# Patient Record
Sex: Female | Born: 1975 | Race: White | Hispanic: No | Marital: Married | Smoking: Former smoker
Health system: Southern US, Community
[De-identification: ages and names within clinical notes are randomized; demographics above are authoritative.]

## PROBLEM LIST (undated history)

## (undated) DIAGNOSIS — R112 Nausea with vomiting, unspecified: Secondary | ICD-10-CM

## (undated) DIAGNOSIS — E079 Disorder of thyroid, unspecified: Secondary | ICD-10-CM

## (undated) DIAGNOSIS — E039 Hypothyroidism, unspecified: Secondary | ICD-10-CM

## (undated) DIAGNOSIS — R51 Headache: Secondary | ICD-10-CM

## (undated) DIAGNOSIS — G9332 Myalgic encephalomyelitis/chronic fatigue syndrome: Secondary | ICD-10-CM

## (undated) DIAGNOSIS — T753XXA Motion sickness, initial encounter: Secondary | ICD-10-CM

## (undated) DIAGNOSIS — M722 Plantar fascial fibromatosis: Secondary | ICD-10-CM

## (undated) DIAGNOSIS — R87629 Unspecified abnormal cytological findings in specimens from vagina: Secondary | ICD-10-CM

## (undated) DIAGNOSIS — K219 Gastro-esophageal reflux disease without esophagitis: Secondary | ICD-10-CM

## (undated) DIAGNOSIS — R519 Headache, unspecified: Secondary | ICD-10-CM

## (undated) DIAGNOSIS — Z9889 Other specified postprocedural states: Secondary | ICD-10-CM

## (undated) DIAGNOSIS — M1990 Unspecified osteoarthritis, unspecified site: Secondary | ICD-10-CM

## (undated) DIAGNOSIS — R5382 Chronic fatigue, unspecified: Secondary | ICD-10-CM

## (undated) HISTORY — PX: FOOT SURGERY: SHX648

## (undated) HISTORY — PX: BLADDER SURGERY: SHX569

## (undated) HISTORY — DX: Hypothyroidism, unspecified: E03.9

## (undated) HISTORY — PX: CERVICAL BIOPSY  W/ LOOP ELECTRODE EXCISION: SUR135

## (undated) HISTORY — DX: Disorder of thyroid, unspecified: E07.9

## (undated) HISTORY — DX: Myalgic encephalomyelitis/chronic fatigue syndrome: G93.32

## (undated) HISTORY — DX: Gastro-esophageal reflux disease without esophagitis: K21.9

## (undated) HISTORY — DX: Unspecified osteoarthritis, unspecified site: M19.90

## (undated) HISTORY — DX: Unspecified abnormal cytological findings in specimens from vagina: R87.629

## (undated) HISTORY — DX: Chronic fatigue, unspecified: R53.82

---

## 2009-10-30 ENCOUNTER — Ambulatory Visit: Payer: Self-pay | Admitting: Internal Medicine

## 2010-07-24 ENCOUNTER — Ambulatory Visit: Payer: Self-pay | Admitting: Internal Medicine

## 2012-08-18 ENCOUNTER — Ambulatory Visit: Payer: Self-pay | Admitting: Obstetrics and Gynecology

## 2012-08-20 ENCOUNTER — Other Ambulatory Visit: Payer: Self-pay | Admitting: Obstetrics and Gynecology

## 2012-08-20 DIAGNOSIS — R928 Other abnormal and inconclusive findings on diagnostic imaging of breast: Secondary | ICD-10-CM

## 2012-09-01 ENCOUNTER — Ambulatory Visit
Admission: RE | Admit: 2012-09-01 | Discharge: 2012-09-01 | Disposition: A | Discharge status: Home / Self Care | Source: Ambulatory Visit | Attending: Obstetrics and Gynecology | Admitting: Obstetrics and Gynecology

## 2012-09-01 DIAGNOSIS — R928 Other abnormal and inconclusive findings on diagnostic imaging of breast: Secondary | ICD-10-CM

## 2012-10-17 ENCOUNTER — Ambulatory Visit: Payer: Self-pay | Admitting: Family Medicine

## 2012-12-31 ENCOUNTER — Ambulatory Visit: Payer: Self-pay

## 2013-06-02 ENCOUNTER — Ambulatory Visit: Payer: Self-pay | Admitting: Physician Assistant

## 2013-06-02 LAB — RAPID INFLUENZA A&B ANTIGENS

## 2013-09-07 DIAGNOSIS — E039 Hypothyroidism, unspecified: Secondary | ICD-10-CM | POA: Insufficient documentation

## 2013-09-07 DIAGNOSIS — E042 Nontoxic multinodular goiter: Secondary | ICD-10-CM | POA: Insufficient documentation

## 2013-12-16 DIAGNOSIS — D8989 Other specified disorders involving the immune mechanism, not elsewhere classified: Secondary | ICD-10-CM | POA: Insufficient documentation

## 2013-12-16 DIAGNOSIS — G9332 Myalgic encephalomyelitis/chronic fatigue syndrome: Secondary | ICD-10-CM | POA: Insufficient documentation

## 2013-12-16 DIAGNOSIS — R5382 Chronic fatigue, unspecified: Secondary | ICD-10-CM | POA: Insufficient documentation

## 2013-12-16 DIAGNOSIS — M2550 Pain in unspecified joint: Secondary | ICD-10-CM | POA: Insufficient documentation

## 2013-12-16 DIAGNOSIS — R635 Abnormal weight gain: Secondary | ICD-10-CM | POA: Insufficient documentation

## 2014-03-28 ENCOUNTER — Ambulatory Visit: Payer: Self-pay

## 2014-07-06 DIAGNOSIS — M7712 Lateral epicondylitis, left elbow: Secondary | ICD-10-CM | POA: Insufficient documentation

## 2014-07-06 DIAGNOSIS — M7710 Lateral epicondylitis, unspecified elbow: Secondary | ICD-10-CM | POA: Insufficient documentation

## 2014-12-06 ENCOUNTER — Telehealth: Payer: Self-pay | Admitting: Obstetrics and Gynecology

## 2014-12-06 NOTE — Telephone Encounter (Signed)
Pt had mammo 09/01/12 and got a letter that she is due for f/u mammo. She went to [facility] b/c they couldn't get a good mammo here at [facility]. SHE WANTS TO KNOW IF SHE CAN GET A MAMMO YEARLY AT HER AGE OR WHAT AGE SHE NEEDS TO GO BACK. SORRY FOR CAPS!!!

## 2014-12-06 NOTE — Telephone Encounter (Signed)
Can typically only have one covered yearly before 78 if strong family h/o breast cancer at young age, or if we are following specific area in herself.  And would have to been seen first to place order.

## 2014-12-06 NOTE — Telephone Encounter (Signed)
pls advise

## 2014-12-07 NOTE — Telephone Encounter (Signed)
Notified pt she is going to call and schedule annual exam in 2 months will get mammo ordered then

## 2014-12-16 ENCOUNTER — Ambulatory Visit (INDEPENDENT_AMBULATORY_CARE_PROVIDER_SITE_OTHER): Payer: Federal, State, Local not specified - PPO | Admitting: Family Medicine

## 2014-12-16 ENCOUNTER — Ambulatory Visit: Payer: Self-pay | Admitting: Family Medicine

## 2014-12-16 ENCOUNTER — Encounter: Payer: Self-pay | Admitting: Family Medicine

## 2014-12-16 ENCOUNTER — Encounter (INDEPENDENT_AMBULATORY_CARE_PROVIDER_SITE_OTHER): Payer: Self-pay

## 2014-12-16 VITALS — BP 102/66 | HR 63 | Temp 98.9°F | Resp 16 | Ht 66.0 in | Wt 204.9 lb

## 2014-12-16 DIAGNOSIS — E038 Other specified hypothyroidism: Secondary | ICD-10-CM | POA: Diagnosis not present

## 2014-12-16 DIAGNOSIS — E042 Nontoxic multinodular goiter: Secondary | ICD-10-CM | POA: Diagnosis not present

## 2014-12-16 DIAGNOSIS — E034 Atrophy of thyroid (acquired): Secondary | ICD-10-CM

## 2014-12-16 NOTE — Progress Notes (Signed)
Name: Courtney Alvarez   MRN: [id number]    DOB: June 09, 1975   Date:12/16/2014       Progress Note  Subjective  Chief Complaint  Chief Complaint  Patient presents with  . 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 Adella Hare, endocrinologist, for 4 years now is here today to establish care for primary care services.  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 Routine blood work done through [facility]. CPE, PAP and Mammogram done through [facility].  Lateefa reports no acute concerns today.  Patient Active Problem List   Diagnosis Date Noted  . Backhand tennis elbow 07/06/2014  . CFIDS (chronic fatigue and immune dysfunction syndrome) 12/16/2013  . Arthralgia of multiple joints 12/16/2013  . Abnormal weight gain 12/16/2013  . 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 Adult hypothyroidism 09/07/2013    Social History  Substance Use Topics  . Smoking status: Former Research scientist (life sciences)  . Smokeless tobacco: Not on file     Comment: quit 06/2003  . Alcohol Use: 0.0 oz/week    0 Standard drinks or equivalent per week     Comment: very rarely, 1-2 per month if any     Current outpatient prescriptions:  .  Cholecalciferol (PA VITAMIN D-3 GUMMY PO), Take by mouth., Disp: , Rfl:  .  lansoprazole (PREVACID) 15 MG capsule, Take 15 mg by mouth daily at 12 noon., Disp: , Rfl:  .  levothyroxine (SYNTHROID) 150 MCG tablet, Take 1 tablet po q daily except on Sunday's, Disp: , Rfl:  .  Multiple Vitamins-Minerals (MULTIVITAMIN GUMMIES WOMENS) CHEW, Chew by mouth., Disp: , Rfl:  .  naproxen (NAPROSYN) 250 MG tablet, Take by mouth 2 (two) times daily with a meal., Disp: , Rfl:   Past Surgical History   Procedure Laterality Date  . Bladder surgery    . Cervical biopsy  w/ loop electrode excision      Family History  Problem Relation Age of Onset  . Arthritis Mother   . COPD Mother   . Thyroid disease Mother   . Arthritis Father   . Hyperlipidemia Father   . Cancer Father     polyp removed  . Hashimoto's thyroiditis Sister   . Arthritis Maternal Grandmother   . Cancer Maternal Grandmother     colon  . Heart disease Maternal Grandmother   . Thyroid disease Maternal Grandmother   . Alcohol abuse Maternal Grandfather   . Arthritis Maternal Grandfather   . Arthritis Paternal Grandmother   . Depression Paternal Grandmother   . Hypertension Paternal Grandmother   . Stroke Paternal Grandmother   . Arthritis Paternal Grandfather   . Heart disease Paternal Grandfather   . Hypertension Paternal Grandfather     No Known Allergies   Review of Systems  CONSTITUTIONAL: No significant weight changes, fever, chills, weakness or fatigue.  HEENT:  - Eyes: No visual changes.  - Ears: No auditory changes. No pain.  - Nose: No sneezing, congestion, runny nose. - Throat: No sore throat. No changes in swallowing. SKIN: No rash or itching.  CARDIOVASCULAR: No chest pain, chest pressure or chest discomfort. No palpitations  or edema.  RESPIRATORY: No shortness of breath, cough or sputum.  GASTROINTESTINAL: No anorexia, nausea, vomiting. No changes in bowel habits. No abdominal pain or blood.  GENITOURINARY: No dysuria. No frequency. No discharge.  NEUROLOGICAL: No headache, dizziness, syncope, paralysis, ataxia, numbness or tingling in the extremities. No memory changes. No change in bowel or bladder control.  MUSCULOSKELETAL: No joint pain. No muscle pain. HEMATOLOGIC: No anemia, bleeding or bruising.  LYMPHATICS: No enlarged lymph nodes.  PSYCHIATRIC: No change in mood. No change in sleep pattern.  ENDOCRINOLOGIC: No reports of sweating, cold or heat intolerance. No polyuria or  polydipsia.     Objective  BP 102/66 mmHg  Pulse 63  Temp(Src) 98.9 F (37.2 C) (Oral)  Resp 16  Ht 5\' 6"  (1.676 m)  Wt 204 lb 14.4 oz (92.942 kg)  BMI 33.09 kg/m2  SpO2 98% Body mass index is 33.09 kg/(m^2).  Physical Exam  Constitutional: Patient is overweight and well-nourished. In no distress.  HEENT:  - Head: Normocephalic and atraumatic.  - Ears: Bilateral TMs gray, no erythema or effusion - Nose: Nasal mucosa moist - Mouth/Throat: Oropharynx is clear and moist. No tonsillar hypertrophy or erythema. No post nasal drainage.  - Eyes: Conjunctivae clear, EOM movements normal. PERRLA. No scleral icterus.  Neck: Normal range of motion. Neck supple. No JVD present. Mild diffuse thyromegaly present.  Cardiovascular: Normal rate, regular rhythm and normal heart sounds.  No murmur heard.  Pulmonary/Chest: Effort normal and breath sounds normal. No respiratory distress. Musculoskeletal: Normal range of motion bilateral UE and LE, no joint effusions. Peripheral vascular: Bilateral LE no edema. Neurological: CN II-XII grossly intact with no focal deficits. Alert and oriented to person, place, and time. Coordination, balance, strength, speech and gait are normal.  Skin: Skin is warm and dry. No rash noted. No erythema.  Psychiatric: Patient has a normal mood and affect. Behavior is normal in office today. Judgment and thought content normal in office today.   Assessment & Plan  1. Hypothyroidism due to acquired atrophy of thyroid Clinically stable findings based on clinical exam and on review of any pertinent results. Recommended to patient that they continue their current regimen with regular follow ups.  2. Goiter, nontoxic, multinodular Clinically stable findings based on clinical exam.  Return for nursing visit for PPD testing.

## 2014-12-23 ENCOUNTER — Ambulatory Visit: Payer: Self-pay | Admitting: Family Medicine

## 2015-01-03 ENCOUNTER — Ambulatory Visit (INDEPENDENT_AMBULATORY_CARE_PROVIDER_SITE_OTHER): Payer: Federal, State, Local not specified - PPO

## 2015-01-03 DIAGNOSIS — Z23 Encounter for immunization: Secondary | ICD-10-CM

## 2015-01-03 DIAGNOSIS — Z111 Encounter for screening for respiratory tuberculosis: Secondary | ICD-10-CM

## 2015-01-05 ENCOUNTER — Ambulatory Visit (INDEPENDENT_AMBULATORY_CARE_PROVIDER_SITE_OTHER): Payer: Federal, State, Local not specified - PPO

## 2015-01-05 DIAGNOSIS — Z23 Encounter for immunization: Secondary | ICD-10-CM

## 2015-01-10 ENCOUNTER — Ambulatory Visit: Payer: Federal, State, Local not specified - PPO

## 2015-01-12 ENCOUNTER — Telehealth: Payer: Self-pay | Admitting: Family Medicine

## 2015-01-12 ENCOUNTER — Ambulatory Visit: Payer: Federal, State, Local not specified - PPO

## 2015-01-12 ENCOUNTER — Other Ambulatory Visit: Payer: Self-pay | Admitting: Family Medicine

## 2015-01-12 DIAGNOSIS — Z9189 Other specified personal risk factors, not elsewhere classified: Principal | ICD-10-CM

## 2015-01-12 DIAGNOSIS — Z2839 Other underimmunization status: Secondary | ICD-10-CM

## 2015-01-12 NOTE — Telephone Encounter (Signed)
Patient was told she needed to varicella vaccinations or have labs drawn proving her immunity in order for her to be accepted into the program. Patient was told that I didn't think that it was a problem to order labs and that we can have it ready when she comes back for the 2nd PPD test.

## 2015-01-12 NOTE — Telephone Encounter (Signed)
Requesting return call concerning chicken pox vaccination.

## 2015-01-12 NOTE — Telephone Encounter (Signed)
Ordered varicella antibody lab work.

## 2015-01-17 ENCOUNTER — Ambulatory Visit (INDEPENDENT_AMBULATORY_CARE_PROVIDER_SITE_OTHER): Payer: Federal, State, Local not specified - PPO

## 2015-01-17 DIAGNOSIS — Z111 Encounter for screening for respiratory tuberculosis: Secondary | ICD-10-CM | POA: Diagnosis not present

## 2015-01-18 ENCOUNTER — Encounter: Payer: Self-pay | Admitting: Family Medicine

## 2015-01-18 ENCOUNTER — Telehealth: Payer: Self-pay | Admitting: Family Medicine

## 2015-01-18 DIAGNOSIS — Z789 Other specified health status: Secondary | ICD-10-CM

## 2015-01-18 LAB — VARICELLA ZOSTER ANTIBODY, IGG: Varicella zoster IgG: >4000 index (ref >165)

## 2015-01-18 NOTE — Telephone Encounter (Signed)
Please let Courtney Alvarez know that her Varicella antibody titers came back positive for proper immunization. Does she need [place] to fax this result or print it out for her school? I have released the results to her through My Chart as well.

## 2015-01-18 NOTE — Telephone Encounter (Signed)
Patient was informed and information was printed for her to pick up tomorrow when she has her TB read.

## 2015-01-19 ENCOUNTER — Encounter: Payer: Self-pay | Admitting: Family Medicine

## 2015-01-19 LAB — TB SKIN TEST
Induration: 0 mm
TB Skin Test: NEGATIVE

## 2015-02-24 ENCOUNTER — Encounter: Payer: Self-pay | Admitting: Family Medicine

## 2015-02-24 ENCOUNTER — Ambulatory Visit (INDEPENDENT_AMBULATORY_CARE_PROVIDER_SITE_OTHER): Payer: Federal, State, Local not specified - PPO | Admitting: Family Medicine

## 2015-02-24 VITALS — BP 108/68 | HR 78 | Temp 98.7°F | Resp 18 | Ht 66.0 in | Wt 200.4 lb

## 2015-02-24 DIAGNOSIS — J040 Acute laryngitis: Secondary | ICD-10-CM

## 2015-02-24 DIAGNOSIS — J0100 Acute maxillary sinusitis, unspecified: Secondary | ICD-10-CM | POA: Diagnosis not present

## 2015-02-24 MED ORDER — FLUTICASONE PROPIONATE 50 MCG/ACT NA SUSP: 2.0000 | Freq: Every day | NASAL | Status: DC

## 2015-02-24 MED ORDER — AZITHROMYCIN 250 MG PO TABS: ORAL_TABLET | ORAL | Status: DC

## 2015-02-24 NOTE — Progress Notes (Signed)
Name: Courtney Alvarez   MRN: [id number]    DOB: 11-Sep-1975   Date:02/24/2015       Progress Note  Subjective  Chief Complaint  Chief Complaint  Patient presents with  . Cough  . Hoarse    symptoms for over a week and not getting better  . Nasal Congestion    Cough This is a recurrent problem. Episode onset: over 7 days ago. The problem has been gradually improving. Associated symptoms include nasal congestion and a sore throat. Pertinent negatives include no chest pain, chills, ear pain, fever, headaches or shortness of breath. She has tried OTC cough suppressant for the symptoms.  Patient has also "lost her voice" in the past few days.  Past Medical History  Diagnosis Date  . Arthritis   . Thyroid disease   . GERD (gastroesophageal reflux disease)   . Chronic fatigue syndrome     Past Surgical History  Procedure Laterality Date  . Bladder surgery    . Cervical biopsy  w/ loop electrode excision      Family History  Problem Relation Age of Onset  . Arthritis Mother   . COPD Mother   . Thyroid disease Mother   . Arthritis Father   . Hyperlipidemia Father   . Cancer Father     polyp removed  . Hashimoto's thyroiditis Sister   . Arthritis Maternal Grandmother   . Cancer Maternal Grandmother     colon  . Heart disease Maternal Grandmother   . Thyroid disease Maternal Grandmother   . Alcohol abuse Maternal Grandfather   . Arthritis Maternal Grandfather   . Arthritis Paternal Grandmother   . Depression Paternal Grandmother   . Hypertension Paternal Grandmother   . Stroke Paternal Grandmother   . Arthritis Paternal Grandfather   . Heart disease Paternal Grandfather   . Hypertension Paternal Grandfather     Social History   Social History  . Marital Status: Married    Spouse Name: N/A  . Number of Children: N/A  . Years of Education: N/A   Occupational History  . Not on file.   Social History Main Topics  . Smoking status: Former Research scientist (life sciences)  . Smokeless  tobacco: Not on file     Comment: quit 06/2003  . Alcohol Use: 0.0 oz/week    0 Standard drinks or equivalent per week     Comment: very rarely, 1-2 per month if any  . Drug Use: No  . Sexual Activity:    Partners: Male    Birth Control/ Protection: IUD   Other Topics Concern  . Not on file   Social History Narrative     Current outpatient prescriptions:  .  Cholecalciferol (PA VITAMIN D-3 GUMMY PO), Take by mouth., Disp: , Rfl:  .  lansoprazole (PREVACID) 15 MG capsule, Take 15 mg by mouth daily at 12 noon., Disp: , Rfl:  .  Multiple Vitamins-Minerals (MULTIVITAMIN GUMMIES WOMENS) CHEW, Chew by mouth., Disp: , Rfl:  .  naproxen (NAPROSYN) 250 MG tablet, Take by mouth 2 (two) times daily with a meal., Disp: , Rfl:  .  NATURE-THROID 97.5 MG TABS, TK 1 T PO QD, Disp: , Rfl: 5  No Known Allergies   Review of Systems  Constitutional: Negative for fever and chills.  HENT: Positive for congestion and sore throat. Negative for ear discharge and ear pain.   Respiratory: Positive for cough. Negative for shortness of breath.   Cardiovascular: Negative for chest pain.  Neurological: Negative for headaches.  Objective  Filed Vitals:   02/24/15 0926  BP: 108/68  Pulse: 78  Temp: 98.7 F (37.1 C)  Resp: 18  Height: 5\' 6"  (1.676 m)  Weight: 200 lb 7 oz (90.918 kg)  SpO2: 98%    Physical Exam  Constitutional: She is well-developed, well-nourished, and in no distress.  HENT:  Head: Normocephalic.  Right Ear: Ear canal normal.  Left Ear: Ear canal normal.  Nose: Rhinorrhea present. Right sinus exhibits maxillary sinus tenderness. Left sinus exhibits maxillary sinus tenderness.  Mouth/Throat: Posterior oropharyngeal erythema present.  TM not visualized 2 cerumen impaction. Nasal turbinates enlarged, inflamed  Neck: Neck supple.  Cardiovascular: Normal rate, regular rhythm and normal heart sounds.   Pulmonary/Chest: Effort normal and breath sounds normal. She has no wheezes.   Nursing note and vitals reviewed.   Assessment & Plan  1. Acute maxillary sinusitis, recurrence not specified Symptoms consistent with acute maxillary sinusitis, present for over 7 days. We'll start on antibiotic and nasal corticosteroid therapy for relief. - azithromycin (ZITHROMAX) 250 MG tablet; 2 tabs po x day 1, then 1 tab po q day x 4 days  Dispense: 6 tablet; Refill: 0 - fluticasone (FLONASE) 50 MCG/ACT nasal spray; Place 2 sprays into both nostrils daily.  Dispense: 16 g; Refill: 2  2. Laryngitis, acute Recommended voice rest, warm liquids. Should resolve with the resolution of sinusitis.   Kaniyah Lisby [facility] 02/24/2015 9:49 AM

## 2015-04-03 ENCOUNTER — Ambulatory Visit (INDEPENDENT_AMBULATORY_CARE_PROVIDER_SITE_OTHER): Payer: Federal, State, Local not specified - PPO | Admitting: Family Medicine

## 2015-04-03 ENCOUNTER — Encounter: Payer: Self-pay | Admitting: Family Medicine

## 2015-04-03 VITALS — BP 110/70 | HR 68 | Temp 98.2°F | Resp 20 | Ht 66.0 in | Wt 204.6 lb

## 2015-04-03 DIAGNOSIS — H9203 Otalgia, bilateral: Secondary | ICD-10-CM

## 2015-04-03 DIAGNOSIS — J0100 Acute maxillary sinusitis, unspecified: Secondary | ICD-10-CM | POA: Diagnosis not present

## 2015-04-03 DIAGNOSIS — H6123 Impacted cerumen, bilateral: Secondary | ICD-10-CM

## 2015-04-03 MED ORDER — PREDNISONE 20 MG PO TABS: 20.0000 mg | ORAL_TABLET | Freq: Every day | ORAL | Status: DC

## 2015-04-03 NOTE — Progress Notes (Addendum)
Name: Courtney Alvarez   MRN: [id number]    DOB: 1975/06/02   Date:04/03/2015       Progress Note  Subjective  Chief Complaint  Chief Complaint  Patient presents with  . Ear Pain    HPI  Otitis media  Sinusitis  Patient presents with greater than 7 day history of nasal congestion and drainage which is purulent in color. There is tenderness over the sinuses. There has been fever to - along with some associated chills on occasion. Usage of over-the-counter medications is not been affected. There is also accompanying cough productive of purulent sputum.  She is seen at the urgent care and treated with a course of amoxicillin and Flonase  Cerumen impaction  Complaint of bilateral ear fullness and pain with decreased hearing.  Past Medical History  Diagnosis Date  . Arthritis   . Thyroid disease   . GERD (gastroesophageal reflux disease)   . Chronic fatigue syndrome     Social History  Substance Use Topics  . Smoking status: Former Research scientist (life sciences)  . Smokeless tobacco: Not on file     Comment: quit 06/2003  . Alcohol Use: 0.0 oz/week    0 Standard drinks or equivalent per week     Comment: very rarely, 1-2 per month if any     Current outpatient prescriptions:  .  amoxicillin (AMOXIL) 875 MG tablet, TK 1 T PO Q 12 H, Disp: , Rfl: 0 .  azithromycin (ZITHROMAX) 250 MG tablet, 2 tabs po x day 1, then 1 tab po q day x 4 days, Disp: 6 tablet, Rfl: 0 .  benzonatate (TESSALON) 100 MG capsule, TK 1 C PO TID PRF COUGH, Disp: , Rfl: 0 .  Cholecalciferol (PA VITAMIN D-3 GUMMY PO), Take by mouth., Disp: , Rfl:  .  fluticasone (FLONASE) 50 MCG/ACT nasal spray, Place 2 sprays into both nostrils daily., Disp: 16 g, Rfl: 2 .  lansoprazole (PREVACID) 15 MG capsule, Take 15 mg by mouth daily at 12 noon., Disp: , Rfl:  .  Multiple Vitamins-Minerals (MULTIVITAMIN GUMMIES WOMENS) CHEW, Chew by mouth., Disp: , Rfl:  .  naproxen (NAPROSYN) 250 MG tablet, Take by mouth 2 (two) times daily with a meal., Disp:  , Rfl:  .  NATURE-THROID 97.5 MG TABS, TK 1 T PO QD, Disp: , Rfl: 5  No Known Allergies  Review of Systems  Constitutional: Negative for fever, chills and weight loss.  HENT: Positive for congestion and ear pain ( fullness of both eardrums). Negative for hearing loss, sore throat and tinnitus.   Eyes: Negative for blurred vision, double vision and redness.  Respiratory: Positive for cough. Negative for hemoptysis and shortness of breath.   Cardiovascular: Negative for chest pain, palpitations, orthopnea, claudication and leg swelling.  Gastrointestinal: Negative for heartburn, nausea, vomiting, diarrhea, constipation and blood in stool.  Genitourinary: Negative for dysuria, urgency, frequency and hematuria.  Musculoskeletal: Negative for myalgias, back pain, joint pain, falls and neck pain.  Skin: Negative for itching.  Neurological: Negative for dizziness, tingling, tremors, focal weakness, seizures, loss of consciousness, weakness and headaches.  Endo/Heme/Allergies: Does not bruise/bleed easily.  Psychiatric/Behavioral: Negative for depression and substance abuse. The patient is not nervous/anxious and does not have insomnia.      Objective  Filed Vitals:   04/03/15 1123  BP: 110/70  Pulse: 68  Temp: 98.2 F (36.8 C)  Resp: 20  Height: 5\' 6"  (1.676 m)  Weight: 204 lb 9 oz (92.789 kg)  SpO2: 98%  Physical Exam  Constitutional: She is oriented to person, place, and time and well-developed, well-nourished, and in no distress.  HENT:  Head: Normocephalic.  Bilateral cerumen impaction is noted nasal turbinates are erythematous with slightly cloudy discharge  Eyes: EOM are normal. Pupils are equal, round, and reactive to light.  Neck: Normal range of motion. No thyromegaly present.  Cardiovascular: Normal rate, regular rhythm and normal heart sounds.   No murmur heard. Pulmonary/Chest: Effort normal and breath sounds normal.  Musculoskeletal: Normal range of motion. She  exhibits no edema.  Neurological: She is alert and oriented to person, place, and time. No cranial nerve deficit. Gait normal.  Skin: Skin is warm and dry. No rash noted.  Psychiatric: Memory and affect normal.      Assessment & Plan  1. Ear pain, bilateral Secondary to cerumen impaction - Ear Lavage  2. Cerumen impaction, bilateral Ear lavage  3. Acute maxillary sinusitis, recurrence not specified As below - amoxicillin (AMOXIL) 875 MG tablet; TK 1 T PO Q 12 H; Refill: 0 - benzonatate (TESSALON) 100 MG capsule; TK 1 C PO TID PRF COUGH; Refill: 0 - predniSONE (DELTASONE) 20 MG tablet; Take 1 tablet (20 mg total) by mouth daily with breakfast.  Dispense: 10 tablet; Refill: 0 . As needed for significant

## 2015-06-22 DIAGNOSIS — R200 Anesthesia of skin: Secondary | ICD-10-CM | POA: Insufficient documentation

## 2015-06-22 DIAGNOSIS — G8929 Other chronic pain: Secondary | ICD-10-CM | POA: Insufficient documentation

## 2015-06-22 DIAGNOSIS — M79673 Pain in unspecified foot: Secondary | ICD-10-CM | POA: Insufficient documentation

## 2015-06-22 DIAGNOSIS — M79672 Pain in left foot: Secondary | ICD-10-CM

## 2015-07-25 DIAGNOSIS — E039 Hypothyroidism, unspecified: Secondary | ICD-10-CM | POA: Diagnosis not present

## 2015-07-31 ENCOUNTER — Ambulatory Visit (INDEPENDENT_AMBULATORY_CARE_PROVIDER_SITE_OTHER): Payer: Federal, State, Local not specified - PPO | Admitting: Family Medicine

## 2015-07-31 ENCOUNTER — Encounter: Payer: Self-pay | Admitting: Family Medicine

## 2015-07-31 ENCOUNTER — Other Ambulatory Visit: Payer: Self-pay

## 2015-07-31 ENCOUNTER — Encounter: Payer: Federal, State, Local not specified - PPO | Admitting: Family Medicine

## 2015-07-31 ENCOUNTER — Telehealth: Payer: Self-pay

## 2015-07-31 VITALS — BP 98/58 | HR 76 | Temp 98.5°F | Resp 14 | Ht 66.0 in | Wt 211.5 lb

## 2015-07-31 DIAGNOSIS — Z800 Family history of malignant neoplasm of digestive organs: Secondary | ICD-10-CM | POA: Insufficient documentation

## 2015-07-31 DIAGNOSIS — E669 Obesity, unspecified: Secondary | ICD-10-CM

## 2015-07-31 DIAGNOSIS — R635 Abnormal weight gain: Secondary | ICD-10-CM | POA: Diagnosis not present

## 2015-07-31 DIAGNOSIS — M2550 Pain in unspecified joint: Secondary | ICD-10-CM

## 2015-07-31 DIAGNOSIS — Z0000 Encounter for general adult medical examination without abnormal findings: Secondary | ICD-10-CM | POA: Diagnosis not present

## 2015-07-31 DIAGNOSIS — Z789 Other specified health status: Secondary | ICD-10-CM

## 2015-07-31 DIAGNOSIS — B1910 Unspecified viral hepatitis B without hepatic coma: Secondary | ICD-10-CM

## 2015-07-31 DIAGNOSIS — E038 Other specified hypothyroidism: Secondary | ICD-10-CM

## 2015-07-31 DIAGNOSIS — E6609 Other obesity due to excess calories: Secondary | ICD-10-CM | POA: Insufficient documentation

## 2015-07-31 DIAGNOSIS — E034 Atrophy of thyroid (acquired): Secondary | ICD-10-CM

## 2015-07-31 NOTE — Assessment & Plan Note (Signed)
Encouraged weight loss, see AVS for recommendations given

## 2015-07-31 NOTE — Assessment & Plan Note (Signed)
Managed by endo; last TSH elevated

## 2015-07-31 NOTE — Progress Notes (Signed)
Patient ID: Courtney Alvarez, female   DOB: 1976/02/06, 40 y.o.   MRN: [id number]   Subjective:   Courtney Alvarez is a 40 y.o. 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  USPSTF grade A and B recommendations Alcohol: less than target Depression:  Depression screen [facility] 2/9 07/31/2015 12/16/2014  Decreased Interest 0 0  Down, Depressed, Hopeless 0 0  PHQ - 2 Score 0 0   Hypertension: no Obesity: weight gain; has thyroid trouble, sees endocrinologist; has been a roller coaster until last 5-6 years; trying different thyroid med with her endo; feeling better from fatigue, but weight has sky rocketed; does have dry skin Tobacco use: former; quit in 2005 HIV, hep B, hep C: tested, in [employer] STD testing and prevention (chl/gon/syphilis): been tested, all good Lipids: check fasting another day Glucose: fasting another day Colorectal cancer: grandmother passed away from colorectal cancer at age 39 (maternal); father had cancerous tissue removed from colon; both sides Breast cancer: first mammo 2 years ago; due again; [facility]; dense breasts; distant relative on mother's side BRCA gene screening: no ovarian ca Intimate partner violence: no Cervical cancer screening: [facility] Lung cancer: n/a Osteoporosis: steroids as a child, sinus problems; green veggies, takes vit D and calcium; fam hx of osteoporosis, paternal grandmother; no periods with Mirena IUD Fall prevention/vitamin D: takes vit D AAA: n/a Aspirin: n/a Diet: tries to eat well, watches what she eats; does splurge, tries to limit fast food Exercise: weight-lifter, cross fit Skin cancer: no worrisome moles now; one thing removed a few years back; used to use tanning beds stopped those in 2006; melanoma in FATHER  Past Medical History  Diagnosis Date  . Arthritis   . Thyroid disease   . GERD (gastroesophageal  reflux disease)   . Chronic fatigue syndrome    Past Surgical History  Procedure Laterality Date  . Bladder surgery    . Cervical biopsy  w/ loop electrode excision     Family History  Problem Relation Age of Onset  . Arthritis Mother   . COPD Mother   . Thyroid disease Mother   . Arthritis Father   . Hyperlipidemia Father   . Cancer Father     polyp removed  . Hashimoto's thyroiditis Sister   . Arthritis Maternal Grandmother   . Cancer Maternal Grandmother     colon  . Heart disease Maternal Grandmother   . Thyroid disease Maternal Grandmother   . Alcohol abuse Maternal Grandfather   . Arthritis Maternal Grandfather   . Arthritis Paternal Grandmother   . Depression Paternal Grandmother   . Hypertension Paternal Grandmother   . Stroke Paternal Grandmother   . Arthritis Paternal Grandfather   . Heart disease Paternal Grandfather   . Hypertension Paternal Grandfather    Social History  Substance Use Topics  . Smoking status: Former Games developer  . Smokeless tobacco: Not on file     Comment: quit 06/2003  . Alcohol Use: 0.0 oz/week    0 Standard drinks or equivalent per week     Comment: very rarely, 1-2 per month if any   Review of Systems  Respiratory: Negative for shortness of breath.   Cardiovascular: Negative for chest pain.  Musculoskeletal: Positive for arthralgias (hips, knees, left ankle; no swelling of MCPs).   Objective:   Filed Vitals:   07/31/15 0824  BP: 98/58  Pulse: 76  Temp: 98.5 F (36.9 C)  TempSrc: Oral  Resp: 14  Height: '5\' 6"'$  (1.676 m)  Weight: 211 lb 8 oz (95.936 kg)  SpO2: 98%   Body mass index is 34.15 kg/(m^2). Wt Readings from Last 3 Encounters:  07/31/15 211 lb 8 oz (95.936 kg)  04/03/15 204 lb 9 oz (92.789 kg)  02/24/15 200 lb 7 oz (90.918 kg)   Physical Exam  Constitutional: She appears well-developed and well-nourished. No distress.  Obese; weight gain 11 pounds over last 5 months  HENT:  Head: Normocephalic and atraumatic.   Eyes: EOM are normal. No scleral icterus.  Neck: No thyromegaly present.  Cardiovascular: Normal rate, regular rhythm and normal heart sounds.   No murmur heard. Pulmonary/Chest: Effort normal and breath sounds normal. No respiratory distress. She has no wheezes.  Breast exam done by [facility]  Abdominal: Soft. Bowel sounds are normal. She exhibits no distension.  Genitourinary:  Gyn exam done by [facility]  Musculoskeletal: Normal range of motion. She exhibits no edema.  Neurological: She is alert. She exhibits normal muscle tone.  Skin: Skin is warm and dry. She is not diaphoretic. No pallor.  Psychiatric: She has a normal mood and affect. Her behavior is normal. Judgment and thought content normal. Her mood appears not anxious. She does not exhibit a depressed mood.   Assessment/Plan:   Problem List Items Addressed This Visit      Endocrine   Adult hypothyroidism    Managed by endo; last TSH elevated        Other   Arthralgia of multiple joints    Nothing on exam to suggest inflammatory condition      Relevant Orders   VITAMIN D 25 Hydroxy (Vit-D Deficiency, Fractures)   Abnormal weight gain    Thyroid dysfunction managed by endo; recommendations for weight loss in AVS      Family history of colon cancer    Patient reports "cancerous cells" from something removed from father's colon, and maternal grandmother died from colon cancer; she would like to talk to a gastroenterologist about whether or not screening is needed before age 85      Relevant Orders   Ambulatory referral to Gastroenterology   Annual physical exam - Primary    USPSTF grade A and B recommendations reviewed with patient; age-appropriate recommendations, preventive care, screening tests, etc discussed and encouraged; healthy living encouraged; see AVS for patient education given to patient; gyn components and breast exam and mammo through GYN office      Relevant Orders   Tympanometry    Visual acuity screening   Comprehensive metabolic panel   Lipid Panel w/o Chol/HDL Ratio   CBC with Differential/Platelet   Obesity    Encouraged weight loss, see AVS for recommendations given       Other Visit Diagnoses    Hepatitis B virus serologic status unknown        check hepatitis B vaccine status prior to starting nursing school    Relevant Orders    Hepatitis B Surface AntiBODY    Hepatitis B Surface AntiGEN       Meds ordered this encounter  Medications  . diclofenac (VOLTAREN) 75 MG EC tablet    Sig: Take 1 tablet by mouth daily.    Refill:  2  . gabapentin (NEURONTIN) 100 MG capsule    Sig: Take 2 capsules by mouth daily.    Refill:  3   Orders Placed This Encounter  Procedures  . Hepatitis  B Surface AntiBODY    Standing Status: Future     Number of Occurrences:      Standing Expiration Date: 09/04/2015  . Hepatitis B Surface AntiGEN    Standing Status: Future     Number of Occurrences:      Standing Expiration Date: 09/04/2015  . Comprehensive metabolic panel    Standing Status: Future     Number of Occurrences:      Standing Expiration Date: 09/04/2015    Order Specific Question:  Has the patient fasted?    Answer:  Yes  . Lipid Panel w/o Chol/HDL Ratio    Standing Status: Future     Number of Occurrences:      Standing Expiration Date: 09/04/2015    Order Specific Question:  Has the patient fasted?    Answer:  Yes  . VITAMIN D 25 Hydroxy (Vit-D Deficiency, Fractures)    Standing Status: Future     Number of Occurrences:      Standing Expiration Date: 09/04/2015  . CBC with Differential/Platelet    Standing Status: Future     Number of Occurrences:      Standing Expiration Date: 09/04/2015  . Ambulatory referral to Gastroenterology    Referral Priority:  Routine    Referral Type:  Consultation    Referral Reason:  Specialty Services Required    Number of Visits Requested:  1  . Visual acuity screening  . Tympanometry    Order Specific Question:   Where should this test be performed?    Answer:  Other   Follow up plan: Return in about 1 year (around 07/30/2016) for complete physical.  An after-visit summary was printed and given to the patient at [facility].  Please see the patient instructions which may contain other information and recommendations beyond what is mentioned above in the assessment and plan.  See form for nursing school

## 2015-07-31 NOTE — Assessment & Plan Note (Signed)
Nothing on exam to suggest inflammatory condition

## 2015-07-31 NOTE — Assessment & Plan Note (Signed)
Thyroid dysfunction managed by endo; recommendations for weight loss in AVS

## 2015-07-31 NOTE — Patient Instructions (Addendum)
Check out the information at familydoctor.org entitled "What It Takes to Lose Weight" Try to lose between 1-2 pounds per week by taking in fewer calories and burning off more calories You can succeed by limiting portions, limiting foods dense in calories and fat, becoming more active, and drinking 8 glasses of water a day (64 ounces) Don't skip meals, especially breakfast, as skipping meals may alter your metabolism Do not use over-the-counter weight loss pills or gimmicks that claim rapid weight loss A healthy BMI (or body mass index) is between 18.5 and 24.9 You can calculate your ideal BMI at the [place] website ClubMonetize.fr  Return for fasting labs some morning this week  Health Maintenance, Female Adopting a healthy lifestyle and getting preventive care can go a long way to promote health and wellness. Talk with your health care provider about what schedule of regular examinations is right for you. This is a good chance for you to check in with your provider about disease prevention and staying healthy. In between checkups, there are plenty of things you can do on your own. Experts have done a lot of research about which lifestyle changes and preventive measures are most likely to keep you healthy. Ask your health care provider for more information. WEIGHT AND DIET  Eat a healthy diet  Be sure to include plenty of vegetables, fruits, low-fat dairy products, and lean protein.  Do not eat a lot of foods high in solid fats, added sugars, or salt.  Get regular exercise. This is one of the most important things you can do for your health.  Most adults should exercise for at least 150 minutes each week. The exercise should increase your heart rate and make you sweat (moderate-intensity exercise).  Most adults should also do strengthening exercises at least twice a week. This is in addition to the moderate-intensity exercise.  Maintain a  healthy weight  Body mass index (BMI) is a measurement that can be used to identify possible weight problems. It estimates body fat based on height and weight. Your health care provider can help determine your BMI and help you achieve or maintain a healthy weight.  For females 78 years of age and older:   A BMI below 18.5 is considered underweight.  A BMI of 18.5 to 24.9 is normal.  A BMI of 25 to 29.9 is considered overweight.  A BMI of 30 and above is considered obese.  Watch levels of cholesterol and blood lipids  You should start having your blood tested for lipids and cholesterol at 41 years of age, then have this test every 5 years.  You may need to have your cholesterol levels checked more often if:  Your lipid or cholesterol levels are high.  You are older than 41 years of age.  You are at high risk for heart disease.  CANCER SCREENING   Lung Cancer  Lung cancer screening is recommended for adults 63-74 years old who are at high risk for lung cancer because of a history of smoking.  A yearly low-dose CT scan of the lungs is recommended for people who:  Currently smoke.  Have quit within the past 15 years.  Have at least a 30-pack-year history of smoking. A pack year is smoking an average of one pack of cigarettes a day for 1 year.  Yearly screening should continue until it has been 15 years since you quit.  Yearly screening should stop if you develop a health problem that would prevent you from having  lung cancer treatment.  Breast Cancer  Practice breast self-awareness. This means understanding how your breasts normally appear and feel.  It also means doing regular breast self-exams. Let your health care provider know about any changes, no matter how small.  If you are in your 20s or 30s, you should have a clinical breast exam (CBE) by a health care provider every 1-3 years as part of a regular health exam.  If you are 52 or older, have a CBE every year.  Also consider having a breast X-ray (mammogram) every year.  If you have a family history of breast cancer, talk to your health care provider about genetic screening.  If you are at high risk for breast cancer, talk to your health care provider about having an MRI and a mammogram every year.  Breast cancer gene (BRCA) assessment is recommended for women who have family members with BRCA-related cancers. BRCA-related cancers include:  Breast.  Ovarian.  Tubal.  Peritoneal cancers.  Results of the assessment will determine the need for genetic counseling and BRCA1 and BRCA2 testing. Cervical Cancer Your health care provider may recommend that you be screened regularly for cancer of the pelvic organs (ovaries, uterus, and vagina). This screening involves a pelvic examination, including checking for microscopic changes to the surface of your cervix (Pap test). You may be encouraged to have this screening done every 3 years, beginning at age 39.  For women ages 43-65, health care providers may recommend pelvic exams and Pap testing every 3 years, or they may recommend the Pap and pelvic exam, combined with testing for human papilloma virus (HPV), every 5 years. Some types of HPV increase your risk of cervical cancer. Testing for HPV may also be done on women of any age with unclear Pap test results.  Other health care providers may not recommend any screening for nonpregnant women who are considered low risk for pelvic cancer and who do not have symptoms. Ask your health care provider if a screening pelvic exam is right for you.  If you have had past treatment for cervical cancer or a condition that could lead to cancer, you need Pap tests and screening for cancer for at least 20 years after your treatment. If Pap tests have been discontinued, your risk factors (such as having a new sexual partner) need to be reassessed to determine if screening should resume. Some women have medical problems that  increase the chance of getting cervical cancer. In these cases, your health care provider may recommend more frequent screening and Pap tests. Colorectal Cancer  This type of cancer can be detected and often prevented.  Routine colorectal cancer screening usually begins at 41 years of age and continues through 41 years of age.  Your health care provider may recommend screening at an earlier age if you have risk factors for colon cancer.  Your health care provider may also recommend using home test kits to check for hidden blood in the stool.  A small camera at the end of a tube can be used to examine your colon directly (sigmoidoscopy or colonoscopy). This is done to check for the earliest forms of colorectal cancer.  Routine screening usually begins at age 55.  Direct examination of the colon should be repeated every 5-10 years through 41 years of age. However, you may need to be screened more often if early forms of precancerous polyps or small growths are found. Skin Cancer  Check your skin from head to toe regularly.  Tell your health care provider about any new moles or changes in moles, especially if there is a change in a mole's shape or color.  Also tell your health care provider if you have a mole that is larger than the size of a pencil eraser.  Always use sunscreen. Apply sunscreen liberally and repeatedly throughout the day.  Protect yourself by wearing long sleeves, pants, a wide-brimmed hat, and sunglasses whenever you are outside. HEART DISEASE, DIABETES, AND HIGH BLOOD PRESSURE   High blood pressure causes heart disease and increases the risk of stroke. High blood pressure is more likely to develop in:  People who have blood pressure in the high end of the normal range (130-139/85-89 mm Hg).  People who are overweight or obese.  People who are African American.  If you are 15-21 years of age, have your blood pressure checked every 3-5 years. If you are 2 years of  age or older, have your blood pressure checked every year. You should have your blood pressure measured twice--once when you are at a hospital or clinic, and once when you are not at a hospital or clinic. Record the average of the two measurements. To check your blood pressure when you are not at a hospital or clinic, you can use:  An automated blood pressure machine at a pharmacy.  A home blood pressure monitor.  If you are between 29 years and 43 years old, ask your health care provider if you should take aspirin to prevent strokes.  Have regular diabetes screenings. This involves taking a blood sample to check your fasting blood sugar level.  If you are at a normal weight and have a low risk for diabetes, have this test once every three years after 41 years of age.  If you are overweight and have a high risk for diabetes, consider being tested at a younger age or more often. PREVENTING INFECTION  Hepatitis B  If you have a higher risk for hepatitis B, you should be screened for this virus. You are considered at high risk for hepatitis B if:  You were born in a country where hepatitis B is common. Ask your health care provider which countries are considered high risk.  Your parents were born in a high-risk country, and you have not been immunized against hepatitis B (hepatitis B vaccine).  You have HIV or AIDS.  You use needles to inject street drugs.  You live with someone who has hepatitis B.  You have had sex with someone who has hepatitis B.  You get hemodialysis treatment.  You take certain medicines for conditions, including cancer, organ transplantation, and autoimmune conditions. Hepatitis C  Blood testing is recommended for:  Everyone born from [birth year] through 1966.  Anyone with known risk factors for hepatitis C. Sexually transmitted infections (STIs)  You should be screened for sexually transmitted infections (STIs) including gonorrhea and chlamydia if:  You are  sexually active and are younger than 41 years of age.  You are older than 41 years of age and your health care provider tells you that you are at risk for this type of infection.  Your sexual activity has changed since you were last screened and you are at an increased risk for chlamydia or gonorrhea. Ask your health care provider if you are at risk.  If you do not have HIV, but are at risk, it may be recommended that you take a prescription medicine daily to prevent HIV infection. This is called pre-exposure  prophylaxis (PrEP). You are considered at risk if:  You are sexually active and do not regularly use condoms or know the HIV status of your partner(s).  You take drugs by injection.  You are sexually active with a partner who has HIV. Talk with your health care provider about whether you are at high risk of being infected with HIV. If you choose to begin PrEP, you should first be tested for HIV. You should then be tested every 3 months for as long as you are taking PrEP.  PREGNANCY   If you are premenopausal and you may become pregnant, ask your health care provider about preconception counseling.  If you may become pregnant, take 400 to 800 micrograms (mcg) of folic acid every day.  If you want to prevent pregnancy, talk to your health care provider about birth control (contraception). OSTEOPOROSIS AND MENOPAUSE   Osteoporosis is a disease in which the bones lose minerals and strength with aging. This can result in serious bone fractures. Your risk for osteoporosis can be identified using a bone density scan.  If you are 17 years of age or older, or if you are at risk for osteoporosis and fractures, ask your health care provider if you should be screened.  Ask your health care provider whether you should take a calcium or vitamin D supplement to lower your risk for osteoporosis.  Menopause may have certain physical symptoms and risks.  Hormone replacement therapy may reduce some  of these symptoms and risks. Talk to your health care provider about whether hormone replacement therapy is right for you.  HOME CARE INSTRUCTIONS   Schedule regular health, dental, and eye exams.  Stay current with your immunizations.   Do not use any tobacco products including cigarettes, chewing tobacco, or electronic cigarettes.  If you are pregnant, do not drink alcohol.  If you are breastfeeding, limit how much and how often you drink alcohol.  Limit alcohol intake to no more than 1 drink per day for nonpregnant women. One drink equals 12 ounces of beer, 5 ounces of wine, or 1 ounces of hard liquor.  Do not use street drugs.  Do not share needles.  Ask your health care provider for help if you need support or information about quitting drugs.  Tell your health care provider if you often feel depressed.  Tell your health care provider if you have ever been abused or do not feel safe at home.   This information is not intended to replace advice given to you by your health care provider. Make sure you discuss any questions you have with your health care provider.   Document Released: 10/15/2010 Document Revised: 04/22/2014 Document Reviewed: 03/03/2013 Elsevier Interactive Patient Education [employer].

## 2015-07-31 NOTE — Telephone Encounter (Signed)
That's fine; I'll be glad to order titers

## 2015-07-31 NOTE — Telephone Encounter (Signed)
Patient cannot find immunization record.  Wants to see about getting labs ordered for titers?  She will come by tomorrow am to pick up

## 2015-07-31 NOTE — Assessment & Plan Note (Signed)
Patient reports "cancerous cells" from something removed from father's colon, and maternal grandmother died from colon cancer; she would like to talk to a gastroenterologist about whether or not screening is needed before age 41

## 2015-07-31 NOTE — Assessment & Plan Note (Signed)
USPSTF grade A and B recommendations reviewed with patient; age-appropriate recommendations, preventive care, screening tests, etc discussed and encouraged; healthy living encouraged; see AVS for patient education given to patient; gyn components and breast exam and mammo through GYN office

## 2015-08-01 ENCOUNTER — Other Ambulatory Visit: Payer: Self-pay

## 2015-08-01 ENCOUNTER — Other Ambulatory Visit: Payer: Self-pay | Admitting: Family Medicine

## 2015-08-01 DIAGNOSIS — Z0184 Encounter for antibody response examination: Secondary | ICD-10-CM

## 2015-08-10 ENCOUNTER — Other Ambulatory Visit: Payer: Self-pay

## 2015-08-10 DIAGNOSIS — R200 Anesthesia of skin: Secondary | ICD-10-CM | POA: Diagnosis not present

## 2015-08-10 DIAGNOSIS — G8929 Other chronic pain: Secondary | ICD-10-CM | POA: Diagnosis not present

## 2015-08-10 DIAGNOSIS — E039 Hypothyroidism, unspecified: Secondary | ICD-10-CM | POA: Diagnosis not present

## 2015-08-10 DIAGNOSIS — M79672 Pain in left foot: Secondary | ICD-10-CM | POA: Diagnosis not present

## 2015-08-10 DIAGNOSIS — F411 Generalized anxiety disorder: Secondary | ICD-10-CM | POA: Diagnosis not present

## 2015-08-10 DIAGNOSIS — R5382 Chronic fatigue, unspecified: Secondary | ICD-10-CM | POA: Diagnosis not present

## 2015-08-10 DIAGNOSIS — E041 Nontoxic single thyroid nodule: Secondary | ICD-10-CM | POA: Diagnosis not present

## 2015-08-15 DIAGNOSIS — Z0184 Encounter for antibody response examination: Secondary | ICD-10-CM | POA: Diagnosis not present

## 2015-08-16 LAB — COMPREHENSIVE METABOLIC PANEL
ALBUMIN: 4.2 g/dL (ref 3.5–5.5)
ALT: 15 IU/L (ref 0–32)
AST: 18 IU/L (ref 0–40)
Albumin/Globulin Ratio: 1.7 (ref 1.2–2.2)
Alkaline Phosphatase: 62 IU/L (ref 39–117)
BILIRUBIN TOTAL: 0.4 mg/dL (ref 0.0–1.2)
BUN / CREAT RATIO: 19 (ref 9–23)
BUN: 17 mg/dL (ref 6–24)
CALCIUM: 8.9 mg/dL (ref 8.7–10.2)
CHLORIDE: 99 mmol/L (ref 96–106)
CO2: 25 mmol/L (ref 18–29)
CREATININE: 0.88 mg/dL (ref 0.57–1.00)
GFR, EST AFRICAN AMERICAN: 95 mL/min/{1.73_m2} (ref >59)
GFR, EST NON AFRICAN AMERICAN: 82 mL/min/{1.73_m2} (ref >59)
GLUCOSE: 91 mg/dL (ref 65–99)
Globulin, Total: 2.5 g/dL (ref 1.5–4.5)
Potassium: 4.3 mmol/L (ref 3.5–5.2)
Sodium: 142 mmol/L (ref 134–144)
TOTAL PROTEIN: 6.7 g/dL (ref 6.0–8.5)

## 2015-08-16 LAB — CBC WITH DIFFERENTIAL/PLATELET
BASOS ABS: 0 10*3/uL (ref 0.0–0.2)
BASOS: 1 %
EOS (ABSOLUTE): 0.2 10*3/uL (ref 0.0–0.4)
Eos: 3 %
HEMOGLOBIN: 13.3 g/dL (ref 11.1–15.9)
Hematocrit: 40 % (ref 34.0–46.6)
IMMATURE GRANS (ABS): 0 10*3/uL (ref 0.0–0.1)
IMMATURE GRANULOCYTES: 0 %
LYMPHS: 29 %
Lymphocytes Absolute: 1.6 10*3/uL (ref 0.7–3.1)
MCH: 29.2 pg (ref 26.6–33.0)
MCHC: 33.3 g/dL (ref 31.5–35.7)
MCV: 88 fL (ref 79–97)
MONOCYTES: 7 %
Monocytes Absolute: 0.4 10*3/uL (ref 0.1–0.9)
NEUTROS ABS: 3.4 10*3/uL (ref 1.4–7.0)
NEUTROS PCT: 60 %
PLATELETS: 256 10*3/uL (ref 150–379)
RBC: 4.56 x10E6/uL (ref 3.77–5.28)
RDW: 13.1 % (ref 12.3–15.4)
WBC: 5.6 10*3/uL (ref 3.4–10.8)

## 2015-08-16 LAB — LIPID PANEL W/O CHOL/HDL RATIO
Cholesterol, Total: 183 mg/dL (ref 100–199)
HDL: 65 mg/dL (ref >39)
LDL CALC: 98 mg/dL (ref 0–99)
Triglycerides: 98 mg/dL (ref 0–149)
VLDL CHOLESTEROL CAL: 20 mg/dL (ref 5–40)

## 2015-08-16 LAB — VITAMIN D 25 HYDROXY (VIT D DEFICIENCY, FRACTURES): VIT D 25 HYDROXY: 39.4 ng/mL (ref 30.0–100.0)

## 2015-08-16 LAB — MEASLES/MUMPS/RUBELLA IMMUNITY
MUMPS ABS, IGG: 61.4 AU/mL (ref >10.9)
RUBEOLA AB, IGG: >300 AU/mL (ref >29.9)
Rubella Antibodies, IGG: 9.05 index (ref >0.99)

## 2015-08-18 ENCOUNTER — Telehealth: Payer: Self-pay

## 2015-08-18 NOTE — Telephone Encounter (Signed)
Please review pt bloodwork

## 2015-08-18 NOTE — Telephone Encounter (Signed)
Pt notified, paperwork ready on monday

## 2015-08-18 NOTE — Telephone Encounter (Signed)
Gastroenterology Pre-Procedure Review  Request Date: TBD Requesting Physician: Dr. Sanda Klein  PATIENT REVIEW QUESTIONS: The patient responded to the following health history questions as indicated:    1. Are you having any GI issues? no 2. Do you have a personal history of Polyps? no 3. Do you have a family history of Colon Cancer or Polyps? yes (Colon cancer) 4. Diabetes Mellitus? no 5. Joint replacements in the past 12 months?no 6. Major health problems in the past 3 months?no 7. Any artificial heart valves, MVP, or defibrillator?no    MEDICATIONS & ALLERGIES:    Patient reports the following regarding taking any anticoagulation/antiplatelet therapy:   Plavix, Coumadin, Eliquis, Xarelto, Lovenox, Pradaxa, Brilinta, or Effient? no Aspirin? no  Patient confirms/reports the following medications:  Current Outpatient Prescriptions  Medication Sig Dispense Refill  . Cholecalciferol (PA VITAMIN D-3 GUMMY PO) Take by mouth.    . diclofenac (VOLTAREN) 75 MG EC tablet Take 1 tablet by mouth daily.  2  . gabapentin (NEURONTIN) 100 MG capsule Take 2 capsules by mouth daily.  3  . Multiple Vitamins-Minerals (MULTIVITAMIN GUMMIES WOMENS) CHEW Chew by mouth.    Marland Kitchen NATURE-THROID 97.5 MG TABS TK 1 T PO QD  5   No current facility-administered medications for this visit.    Patient confirms/reports the following allergies:  No Known Allergies  No orders of the defined types were placed in this encounter.    AUTHORIZATION INFORMATION Primary Insurance: 1D#: Group #:  Secondary Insurance: 1D#: Group #:  SCHEDULE INFORMATION: Date: TBD - pt needs to coordinate with husband. Will call. Back.  Time: Location:

## 2015-08-18 NOTE — Telephone Encounter (Signed)
Her labs look fabulous She is indeed immune to measles, mumps, and rubella Her cholesterol is wonderful, and the rest of her labs are normal

## 2015-08-19 ENCOUNTER — Telehealth: Payer: Self-pay | Admitting: Family Medicine

## 2015-08-19 NOTE — Telephone Encounter (Signed)
I don't see the results of the hepatitis B surface antigen or surface antibody Please check with lab Thank you

## 2015-08-21 NOTE — Telephone Encounter (Signed)
Sorry messed up on this one as well I meant rubella?

## 2015-08-21 NOTE — Telephone Encounter (Signed)
She did not get because she brung back list of immunizations and had those documented all she needed was MMR.  I see that she is not immune to mumps so she will need vaccine correct?

## 2015-08-21 NOTE — Telephone Encounter (Signed)
You never need too apologize for being thorough  :-) She is indeed immune to rubella as well; her number is over 9, and it only needs to be 1 or higher to demonstrate immunity She is immune to measles, mumps, and rubella; good news

## 2015-08-21 NOTE — Telephone Encounter (Signed)
Thanks for letting me know about the hep B reason She is in fact immune to mumps; her number is 48, so she does have immunity; she will not need a mumps vaccine Thanks for clarifying though Please let her know that overall her labs look great

## 2015-09-06 DIAGNOSIS — M79672 Pain in left foot: Secondary | ICD-10-CM | POA: Diagnosis not present

## 2015-09-06 DIAGNOSIS — G8929 Other chronic pain: Secondary | ICD-10-CM | POA: Diagnosis not present

## 2015-09-12 NOTE — Telephone Encounter (Signed)
I believe all of this has been taken care of so I'm signing off on the note

## 2015-10-20 ENCOUNTER — Ambulatory Visit (INDEPENDENT_AMBULATORY_CARE_PROVIDER_SITE_OTHER): Payer: Federal, State, Local not specified - PPO

## 2015-10-20 ENCOUNTER — Ambulatory Visit (INDEPENDENT_AMBULATORY_CARE_PROVIDER_SITE_OTHER): Payer: Federal, State, Local not specified - PPO | Admitting: Sports Medicine

## 2015-10-20 ENCOUNTER — Encounter: Payer: Self-pay | Admitting: Sports Medicine

## 2015-10-20 DIAGNOSIS — M216X9 Other acquired deformities of unspecified foot: Secondary | ICD-10-CM | POA: Diagnosis not present

## 2015-10-20 DIAGNOSIS — M79672 Pain in left foot: Secondary | ICD-10-CM

## 2015-10-20 DIAGNOSIS — M7662 Achilles tendinitis, left leg: Secondary | ICD-10-CM | POA: Diagnosis not present

## 2015-10-20 DIAGNOSIS — M792 Neuralgia and neuritis, unspecified: Secondary | ICD-10-CM | POA: Diagnosis not present

## 2015-10-20 DIAGNOSIS — M722 Plantar fascial fibromatosis: Secondary | ICD-10-CM

## 2015-10-20 MED ORDER — TRIAMCINOLONE ACETONIDE 10 MG/ML IJ SUSP: 10.0000 mg | Freq: Once | INTRAMUSCULAR | Status: DC

## 2015-10-20 MED ORDER — METHYLPREDNISOLONE 4 MG PO TBPK: ORAL_TABLET | ORAL | Status: DC

## 2015-10-20 NOTE — Patient Instructions (Signed)

## 2015-10-21 NOTE — Progress Notes (Signed)
Patient ID: Courtney Alvarez, female   DOB: [birth year], 40 y.o.   MRN: [id number] Subjective: Courtney Alvarez is a 40 y.o. female patient presents to office with complaint of heel pain on the left x 1 year. Patient states that she has been treated by Dr. Vickki Muff at [facility] and had NCV test done with Dr. Melrose Nakayama neurologist for pain in foot which has come back as normal. Patient states that she was being treated for tarsal tunnel and had inserts custom made which makes pain worse and also has been started on Diclofenac, Gabapentin with a little help but no relief. Patient reports that she is here for 2nd opinion because nothing has helped and she is having pain on outside of her heel not the inside of her heel. Denies any other pedal complaints.   Admits that she is in [employer] and has to go for training which can also be contributing to why her foot is not getting better. Denies any known injury that could of started this; states that at first pain was occassional at the heel but now it is constant burning, sharp, shooting pain at outside of heel that radiates up back of calf. No other issues noted.   Patient Active Problem List   Diagnosis Date Noted  . Immunity status testing 08/01/2015  . Family history of colon cancer 07/31/2015  . Annual physical exam 07/31/2015  . Obesity 07/31/2015  . Foot pain 06/22/2015  . Loss of feeling or sensation 06/22/2015  . Backhand tennis elbow 07/06/2014  . Lateral epicondylitis of left elbow 07/06/2014  . CFIDS (chronic fatigue and immune dysfunction syndrome) 12/16/2013  . Arthralgia of multiple joints 12/16/2013  . Abnormal weight gain 12/16/2013  . 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 Adult hypothyroidism 09/07/2013    Current Outpatient Prescriptions on File Prior to Visit  Medication Sig Dispense Refill  . Cholecalciferol (PA VITAMIN D-3 GUMMY PO) Take by mouth.    . diclofenac (VOLTAREN) 75 MG EC tablet Take 1 tablet by mouth daily.  2   . gabapentin (NEURONTIN) 100 MG capsule Take 2 capsules by mouth daily.  3  . Multiple Vitamins-Minerals (MULTIVITAMIN GUMMIES WOMENS) CHEW Chew by mouth.    Marland Kitchen NATURE-THROID 97.5 MG TABS TK 1 T PO QD  5   No current facility-administered medications on file prior to visit.    No Known Allergies  Objective: Physical Exam General: The patient is alert and oriented x3 in no acute distress.  Dermatology: Skin is warm, dry and supple bilateral lower extremities. Nails 1-10 are normal. There is no erythema, edema, no eccymosis, no open lesions present. Integument is otherwise unremarkable.  Vascular: Dorsalis Pedis pulse and Posterior Tibial pulse are 2/4 bilateral. Capillary fill time is immediate to all digits.  Neurological: Grossly intact to light touch with an achilles reflex of +2/5 and a  negative Tinel's sign bilateral. Subjective burning to left lateral heel.   Musculoskeletal: No medial foot or ankle symptoms. There is Tenderness to palpation at the lateralcalcaneal tubercale and through the insertion of the plantar fascia on the left foot and at achilles insertion lateral aspect. No pain with compression of calcaneus bilateral. No pain with tuning fork to calcaneus bilateral. No pain with calf compression bilateral. There is decreased Ankle joint range of motion bilateral. All other joints range of motion within normal limits bilateral. Pes cavus foot type. Strength 5/5 in all groups bilateral.   Xray, Left foot:  Normal osseous mineralization. Joint spaces preserved.  No fracture/dislocation/boney destruction. Supinated position of foot, no obvious Calcaneal spur present. No soft tissue abnormalities or radiopaque foreign bodies.   Assessment and Plan: Problem List Items Addressed This Visit      Other   Foot pain - Primary   Relevant Medications   methylPREDNISolone (MEDROL DOSEPAK) 4 MG TBPK tablet   triamcinolone acetonide (KENALOG) 10 MG/ML injection 10 mg   Other Relevant  Orders   DG Foot 2 Views Left    Other Visit Diagnoses    Plantar fasciitis, left        lateral band     Relevant Medications    methylPREDNISolone (MEDROL DOSEPAK) 4 MG TBPK tablet    triamcinolone acetonide (KENALOG) 10 MG/ML injection 10 mg    Neuritis        Relevant Medications    methylPREDNISolone (MEDROL DOSEPAK) 4 MG TBPK tablet    triamcinolone acetonide (KENALOG) 10 MG/ML injection 10 mg    Tendonitis, Achilles, left        compensation    Relevant Medications    methylPREDNISolone (MEDROL DOSEPAK) 4 MG TBPK tablet    triamcinolone acetonide (KENALOG) 10 MG/ML injection 10 mg    Pes cavus, unspecified laterality        Relevant Medications    methylPREDNISolone (MEDROL DOSEPAK) 4 MG TBPK tablet    triamcinolone acetonide (KENALOG) 10 MG/ML injection 10 mg      -Complete examination performed.  -Xrays reviewed -NCV studies reviewed from [place] which are normal  -Discussed with patient in detail the condition of lateral band plantar fasciitis and compensation pain, how this occurs secondary to foot type and general treatment options. Explained both conservative and surgical treatments.  -After oral consent and aseptic prep, injected a mixture containing 1 ml of 2%  plain lidocaine, 1 ml 0.5% plain marcaine, 0.5 ml of kenalog 10 and 0.5 ml of dexamethasone phosphate into left heel lateral aspect. Post-injection care discussed with patient.  -Rx Medrol dose pack once complete to resume diclofenac of which she already has -Cont with Gabapentin as needed -Recommended good supportive shoes -Added felt kinetic wedge to her left current custom molded orthoses, advised patient if this makes her symptoms worse that she should stop wearing them and at a later time we may make a better device for her -Plantar fascia strapping was applied to left foot and advised patient to keep intact for 5 days and that she can replicate taping to protect her foot during her [employer] training session  that will be later this month until August. -Explained and dispensed to patient daily stretching exercises. -Recommend patient to ice affected area 1-2x daily. 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 Advised patient if symptoms are not better at next visit will order MRI and arthritic panel to rule out any other possible causes of continued foot pain.   Landis Martins, DPM

## 2015-10-30 ENCOUNTER — Other Ambulatory Visit: Payer: Self-pay

## 2015-11-23 DIAGNOSIS — R5382 Chronic fatigue, unspecified: Secondary | ICD-10-CM | POA: Diagnosis not present

## 2015-11-23 DIAGNOSIS — E041 Nontoxic single thyroid nodule: Secondary | ICD-10-CM | POA: Diagnosis not present

## 2015-11-23 DIAGNOSIS — E039 Hypothyroidism, unspecified: Secondary | ICD-10-CM | POA: Diagnosis not present

## 2015-12-08 ENCOUNTER — Other Ambulatory Visit: Payer: Self-pay

## 2015-12-08 ENCOUNTER — Telehealth: Payer: Self-pay

## 2015-12-08 NOTE — Telephone Encounter (Signed)
Gastroenterology Pre-Procedure Review  Request Date: 01/05/2016 Requesting Physician: Dr. Sanda Klein  PATIENT REVIEW QUESTIONS: The patient responded to the following health history questions as indicated:    1. Are you having any GI issues? yes (slight rectal bleeding, hemorrhoids) 2. Do you have a personal history of Polyps? no 3. Do you have a family history of Colon Cancer or Polyps? yes (maternal grandmother, colon cancer - father, polyps) 4. Diabetes Mellitus? no 5. Joint replacements in the past 12 months?no 6. Major health problems in the past 3 months?no 7. Any artificial heart valves, MVP, or defibrillator?no    MEDICATIONS & ALLERGIES:    Patient reports the following regarding taking any anticoagulation/antiplatelet therapy:   Plavix, Coumadin, Eliquis, Xarelto, Lovenox, Pradaxa, Brilinta, or Effient? no Aspirin? no  Patient confirms/reports the following medications:  Current Outpatient Prescriptions  Medication Sig Dispense Refill  . Cholecalciferol (PA VITAMIN D-3 GUMMY PO) Take by mouth.    . Multiple Vitamins-Minerals (MULTIVITAMIN GUMMIES WOMENS) CHEW Chew by mouth.    Marland Kitchen NATURE-THROID 97.5 MG TABS TK 1 T PO QD  5   Current Facility-Administered Medications  Medication Dose Route Frequency Provider Last Rate Last Dose  . triamcinolone acetonide (KENALOG) 10 MG/ML injection 10 mg  10 mg Other Once Landis Martins, DPM        Patient confirms/reports the following allergies:  No Known Allergies  No orders of the defined types were placed in this encounter.   AUTHORIZATION INFORMATION Primary Insurance: 1D#: Group #:  Secondary Insurance: 1D#: Group #:  SCHEDULE INFORMATION: Date: 01/05/2016 Time: Location: [facility]

## 2015-12-08 NOTE — Telephone Encounter (Signed)
Screening Colonoscopy Z12.11 [facility] [id number] Please pre cert

## 2015-12-22 DIAGNOSIS — K080 Exfoliation of teeth due to systemic causes: Secondary | ICD-10-CM | POA: Diagnosis not present

## 2015-12-29 ENCOUNTER — Ambulatory Visit (INDEPENDENT_AMBULATORY_CARE_PROVIDER_SITE_OTHER): Payer: Federal, State, Local not specified - PPO | Admitting: Podiatry

## 2015-12-29 ENCOUNTER — Encounter: Payer: Self-pay | Admitting: Podiatry

## 2015-12-29 DIAGNOSIS — M25572 Pain in left ankle and joints of left foot: Secondary | ICD-10-CM

## 2015-12-29 DIAGNOSIS — M79672 Pain in left foot: Secondary | ICD-10-CM

## 2015-12-29 DIAGNOSIS — M722 Plantar fascial fibromatosis: Secondary | ICD-10-CM

## 2015-12-29 DIAGNOSIS — M65872 Other synovitis and tenosynovitis, left ankle and foot: Secondary | ICD-10-CM

## 2015-12-29 DIAGNOSIS — M659 Synovitis and tenosynovitis, unspecified: Secondary | ICD-10-CM

## 2015-12-29 NOTE — Progress Notes (Signed)
Subjective: Patient presents today for follow-up evaluation and treatment of plantar fasciitis to the left foot. Patient states that she's not wearing her custom molded orthotics because they did not work. At the moment she is not able to run. The diclofenac which was prescribed to her mixture sleepy and she is not able to take it. Patient states that her heel pain is still sore but is somewhat improved. Patient also states that she does have a history of ankle sprain to the left ankle.  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 She states that one week and per month she is in her [employer] doing active training. She also likes to run in her running shoes.  Objective: Physical Exam General: The patient is alert and oriented x3 in no acute distress.  Dermatology: Skin is warm, dry and supple bilateral lower extremities. Negative for open lesions or macerations bilateral.   Vascular: Dorsalis Pedis and Posterior Tibial pulses palpable bilateral.  Capillary fill time is immediate to all digits.  Neurological: Epicritic and protective threshold intact bilateral.   Musculoskeletal: Tenderness to palpation at the medial calcaneal tubercale and through the insertion of the plantar fascia of the left foot. All other joints range of motion within normal limits bilateral. Strength 5/5 in all groups bilateral.   Pain on palpation to the medial anterior and lateral aspects of the patient's left ankle.  Assessment: #1 plantar fasciitis left foot #2 ankle joint synovitis left #3 pain in left foot and ankle. Problem List Items Addressed This Visit    None    Visit Diagnoses   None.      Plan of Care:   1. Patient evaluated. Xrays reviewed.   2. Injection of 0.5cc Celestone soluspan injected into the left plantar fascia.  3. Instructed patient regarding therapies and modalities at home to alleviate symptoms.  4. Also recommend left ankle brace with laces for chronic history of left ankle  sprains. 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  6. Return to clinic prn.

## 2016-01-01 ENCOUNTER — Encounter: Payer: Self-pay | Admitting: *Deleted

## 2016-01-01 ENCOUNTER — Ambulatory Visit: Payer: Federal, State, Local not specified - PPO

## 2016-01-01 DIAGNOSIS — Z111 Encounter for screening for respiratory tuberculosis: Secondary | ICD-10-CM

## 2016-01-03 MED ORDER — BETAMETHASONE SOD PHOS & ACET 6 (3-3) MG/ML IJ SUSP: 12.0000 mg | Freq: Once | INTRAMUSCULAR | Status: DC

## 2016-01-04 LAB — QUANTIFERON TB GOLD ASSAY (BLOOD)
Interferon Gamma Release Assay: NEGATIVE
MITOGEN-NIL SO: 2.61 [IU]/mL
QUANTIFERON NIL VALUE: 0.06 [IU]/mL
QUANTIFERON TB AG MINUS NIL: 0.03 [IU]/mL

## 2016-01-05 ENCOUNTER — Ambulatory Visit: Payer: Federal, State, Local not specified - PPO | Admitting: Anesthesiology

## 2016-01-05 ENCOUNTER — Ambulatory Visit
Admission: RE | Admit: 2016-01-05 | Discharge: 2016-01-05 | Disposition: A | Discharge status: Home / Self Care | Payer: Federal, State, Local not specified - PPO | Source: Ambulatory Visit | Attending: Gastroenterology | Admitting: Gastroenterology

## 2016-01-05 ENCOUNTER — Encounter
Admission: RE | Disposition: A | Discharge status: Home / Self Care | Payer: Self-pay | Source: Ambulatory Visit | Attending: Gastroenterology

## 2016-01-05 DIAGNOSIS — Z87891 Personal history of nicotine dependence: Secondary | ICD-10-CM | POA: Diagnosis not present

## 2016-01-05 DIAGNOSIS — E039 Hypothyroidism, unspecified: Secondary | ICD-10-CM | POA: Insufficient documentation

## 2016-01-05 DIAGNOSIS — D125 Benign neoplasm of sigmoid colon: Secondary | ICD-10-CM | POA: Diagnosis not present

## 2016-01-05 DIAGNOSIS — Z1211 Encounter for screening for malignant neoplasm of colon: Secondary | ICD-10-CM | POA: Diagnosis not present

## 2016-01-05 DIAGNOSIS — R5382 Chronic fatigue, unspecified: Secondary | ICD-10-CM | POA: Insufficient documentation

## 2016-01-05 DIAGNOSIS — Z8371 Family history of colonic polyps: Secondary | ICD-10-CM

## 2016-01-05 DIAGNOSIS — Z793 Long term (current) use of hormonal contraceptives: Secondary | ICD-10-CM | POA: Diagnosis not present

## 2016-01-05 DIAGNOSIS — Z79899 Other long term (current) drug therapy: Secondary | ICD-10-CM | POA: Insufficient documentation

## 2016-01-05 DIAGNOSIS — K640 First degree hemorrhoids: Secondary | ICD-10-CM | POA: Insufficient documentation

## 2016-01-05 DIAGNOSIS — K635 Polyp of colon: Secondary | ICD-10-CM | POA: Diagnosis not present

## 2016-01-05 DIAGNOSIS — K219 Gastro-esophageal reflux disease without esophagitis: Secondary | ICD-10-CM | POA: Insufficient documentation

## 2016-01-05 HISTORY — DX: Motion sickness, initial encounter: T75.3XXA

## 2016-01-05 HISTORY — DX: Plantar fascial fibromatosis: M72.2

## 2016-01-05 HISTORY — DX: Other specified postprocedural states: Z98.890

## 2016-01-05 HISTORY — PX: COLONOSCOPY WITH PROPOFOL: SHX5780

## 2016-01-05 HISTORY — DX: Headache: R51

## 2016-01-05 HISTORY — DX: Nausea with vomiting, unspecified: R11.2

## 2016-01-05 HISTORY — DX: Headache, unspecified: R51.9

## 2016-01-05 HISTORY — PX: POLYPECTOMY: SHX5525

## 2016-01-05 SURGERY — COLONOSCOPY WITH PROPOFOL
Anesthesia: Monitor Anesthesia Care | Wound class: Contaminated

## 2016-01-05 MED ORDER — LACTATED RINGERS IV SOLN
INTRAVENOUS | Status: DC
  Administered 2016-01-05: 09:00:00 via INTRAVENOUS

## 2016-01-05 MED ORDER — LIDOCAINE HCL (CARDIAC) 20 MG/ML IV SOLN
INTRAVENOUS | Status: DC | PRN
  Administered 2016-01-05: 30 mg via INTRAVENOUS

## 2016-01-05 MED ORDER — PROPOFOL 10 MG/ML IV BOLUS
INTRAVENOUS | Status: DC | PRN
  Administered 2016-01-05 (×8): 50 mg via INTRAVENOUS

## 2016-01-05 MED ORDER — STERILE WATER FOR IRRIGATION IR SOLN
Status: DC | PRN
  Administered 2016-01-05: 10:00:00

## 2016-01-05 SURGICAL SUPPLY — 23 items
CANISTER SUCT 1200ML W/VALVE (MISCELLANEOUS) ×3 IMPLANT
CLIP HMST 235XBRD CATH ROT (MISCELLANEOUS) IMPLANT
CLIP RESOLUTION 360 11X235 (MISCELLANEOUS)
FCP ESCP3.2XJMB 240X2.8X (MISCELLANEOUS)
FORCEPS BIOP RAD 4 LRG CAP 4 (CUTTING FORCEPS) IMPLANT
FORCEPS BIOP RJ4 240 W/NDL (MISCELLANEOUS)
FORCEPS ESCP3.2XJMB 240X2.8X (MISCELLANEOUS) IMPLANT
GOWN CVR UNV OPN BCK APRN NK (MISCELLANEOUS) ×4 IMPLANT
GOWN ISOL THUMB LOOP REG UNIV (MISCELLANEOUS) ×2
INJECTOR VARIJECT VIN23 (MISCELLANEOUS) IMPLANT
KIT DEFENDO VALVE AND CONN (KITS) IMPLANT
KIT ENDO PROCEDURE OLY (KITS) ×3 IMPLANT
MARKER SPOT ENDO TATTOO 5ML (MISCELLANEOUS) IMPLANT
PAD GROUND ADULT SPLIT (MISCELLANEOUS) IMPLANT
PROBE APC STR FIRE (PROBE) IMPLANT
RETRIEVER NET ROTH 2.5X230 LF (MISCELLANEOUS) IMPLANT
SNARE SHORT THROW 13M SML OVAL (MISCELLANEOUS) IMPLANT
SNARE SHORT THROW 30M LRG OVAL (MISCELLANEOUS) IMPLANT
SNARE SNG USE RND 15MM (INSTRUMENTS) IMPLANT
SPOT EX ENDOSCOPIC TATTOO (MISCELLANEOUS)
TRAP ETRAP POLY (MISCELLANEOUS) IMPLANT
VARIJECT INJECTOR VIN23 (MISCELLANEOUS)
WATER STERILE IRR 250ML POUR (IV SOLUTION) ×3 IMPLANT

## 2016-01-05 NOTE — Anesthesia Preprocedure Evaluation (Signed)
Anesthesia Evaluation  Patient identified by MRN, date of birth, ID band  Reviewed: NPO status   History of Anesthesia Complications (+) PONV and history of anesthetic complications  Airway Mallampati: II  TM Distance: >3 FB Neck ROM: full    Dental no notable dental hx.    Pulmonary neg pulmonary ROS, former smoker,    Pulmonary exam normal        Cardiovascular Exercise Tolerance: Good negative cardio ROS Normal cardiovascular exam     Neuro/Psych  Headaches, Chronic fatigue negative psych ROS   GI/Hepatic Neg liver ROS, GERD  Controlled,  Endo/Other  Hypothyroidism   Renal/GU negative Renal ROS  negative genitourinary   Musculoskeletal   Abdominal   Peds  Hematology negative hematology ROS (+)   Anesthesia Other Findings   Reproductive/Obstetrics negative OB ROS                             Anesthesia Physical Anesthesia Plan  ASA: II  Anesthesia Plan: MAC   Post-op Pain Management:    Induction:   Airway Management Planned:   Additional Equipment:   Intra-op Plan:   Post-operative Plan:   Informed Consent: I have reviewed the patients History and Physical, chart, labs and discussed the procedure including the risks, benefits and alternatives for the proposed anesthesia with the patient or authorized representative who has indicated his/her understanding and acceptance.     Plan Discussed with: CRNA  Anesthesia Plan Comments:         Anesthesia Quick Evaluation

## 2016-01-05 NOTE — Anesthesia Postprocedure Evaluation (Signed)
Anesthesia Post Note  Patient: Courtney Alvarez  Procedure(s) Performed: Procedure(s) (LRB): COLONOSCOPY WITH PROPOFOL (N/A) POLYPECTOMY  Patient location during evaluation: PACU Anesthesia Type: MAC Level of consciousness: awake and alert Pain management: pain level controlled Vital Signs Assessment: post-procedure vital signs reviewed and stable Respiratory status: spontaneous breathing, nonlabored ventilation, respiratory function stable and patient connected to nasal cannula oxygen Cardiovascular status: stable and blood pressure returned to baseline Anesthetic complications: no    Hartford Maulden

## 2016-01-05 NOTE — Discharge Instructions (Signed)

## 2016-01-05 NOTE — Transfer of Care (Signed)
Immediate Anesthesia Transfer of Care Note  Patient: Courtney Alvarez  Procedure(s) Performed: Procedure(s): COLONOSCOPY WITH PROPOFOL (N/A) POLYPECTOMY  Patient Location: PACU  Anesthesia Type: MAC  Level of Consciousness: awake, alert  and patient cooperative  Airway and Oxygen Therapy: Patient Spontanous Breathing and Patient connected to supplemental oxygen  Post-op Assessment: Post-op Vital signs reviewed, Patient's Cardiovascular Status Stable, Respiratory Function Stable, Patent Airway and No signs of Nausea or vomiting  Post-op Vital Signs: Reviewed and stable  Complications: No apparent anesthesia complications

## 2016-01-05 NOTE — H&P (Signed)
Lucilla Lame, MD [facility] [address], [place], [place] [address] Phone: [phone] Fax : [phone]  Primary Care Physician:  Enid Derry, MD Primary Gastroenterologist:  Dr. Allen Norris  Pre-Procedure History & Physical: HPI:  Courtney Alvarez is a 40 y.o. female is here for an colonoscopy.   Past Medical History:  Diagnosis Date  . Arthritis   . Chronic fatigue syndrome   . GERD (gastroesophageal reflux disease)   . Headache    tension/cluster  . Motion sickness   . Plantar fasciitis of left foot   . PONV (postoperative nausea and vomiting)   . Thyroid disease     Past Surgical History:  Procedure Laterality Date  . BLADDER SURGERY    . CERVICAL BIOPSY  W/ LOOP ELECTRODE EXCISION      Prior to Admission medications   Medication Sig Start Date End Date Taking? Authorizing Provider  calcium carbonate (TUMS - DOSED IN MG ELEMENTAL CALCIUM) 500 MG chewable tablet Chew 1 tablet by mouth daily.   Yes Historical Provider, MD  Cholecalciferol (PA VITAMIN D-3 GUMMY PO) Take by mouth.   Yes Historical Provider, MD  levonorgestrel (MIRENA) 20 MCG/24HR IUD 1 each by Intrauterine route once.   Yes Historical Provider, MD  Multiple Vitamins-Minerals (MULTIVITAMIN GUMMIES WOMENS) CHEW Chew by mouth.   Yes Historical Provider, MD  NATURE-THROID 97.5 MG TABS TK 1 T PO QD 01/27/15  Yes Historical Provider, MD  ranitidine (ZANTAC) 150 MG tablet Take 150 mg by mouth as needed for heartburn.   Yes Historical Provider, MD    Allergies as of 12/08/2015  . (No Known Allergies)    Family History  Problem Relation Age of Onset  . Arthritis Mother   . COPD Mother   . Thyroid disease Mother   . Arthritis Father   . Hyperlipidemia Father   . Cancer Father     polyp removed  . Hashimoto's thyroiditis Sister   . Arthritis Maternal Grandmother   . Cancer Maternal Grandmother     colon  . Heart disease Maternal Grandmother   . Thyroid disease Maternal Grandmother   . Alcohol abuse  Maternal Grandfather   . Arthritis Maternal Grandfather   . Arthritis Paternal Grandmother   . Depression Paternal Grandmother   . Hypertension Paternal Grandmother   . Stroke Paternal Grandmother   . Arthritis Paternal Grandfather   . Heart disease Paternal Grandfather   . Hypertension Paternal Grandfather     Social History   Social History  . Marital status: Married    Spouse name: N/A  . Number of children: N/A  . Years of education: N/A   Occupational History  . Not on file.   Social History Main Topics  . Smoking status: Former Smoker    Quit date: 06/14/2003  . Smokeless tobacco: Never Used     Comment: quit 06/2003  . Alcohol use 1.2 oz/week    2 Glasses of wine per week     Comment:    . Drug use: No  . Sexual activity: Yes    Partners: Male    Birth control/ protection: IUD   Other Topics Concern  . Not on file   Social History Narrative  . No narrative on file    Review of Systems: See HPI, otherwise negative ROS  Physical Exam: BP 117/73   Pulse 71   Temp 97.9 F (36.6 C) (Temporal)   Ht 5\' 6"  (1.676 m)   Wt 201 lb (91.2 kg)   SpO2  97%   BMI 32.44 kg/m  General:   Alert,  pleasant and cooperative in NAD Head:  Normocephalic and atraumatic. Neck:  Supple; no masses or thyromegaly. Lungs:  Clear throughout to auscultation.    Heart:  Regular rate and rhythm. Abdomen:  Soft, nontender and nondistended. Normal bowel sounds, without guarding, and without rebound.   Neurologic:  Alert and  oriented x4;  grossly normal neurologically.  Impression/Plan: Courtney Alvarez is here for an colonoscopy to be performed for family history of polyps  Risks, benefits, limitations, and alternatives regarding  colonoscopy have been reviewed with the patient.  Questions have been answered.  All parties agreeable.   Lucilla Lame, MD  01/05/2016, 9:33 AM

## 2016-01-05 NOTE — Op Note (Signed)
[facility] Gastroenterology Patient Name: Courtney Alvarez Procedure Date: 01/05/2016 9:31 AM MRN: [id number] Account #: [id number] Date of Birth: [birth year] Admit Type: Outpatient Age: 40 Room: [facility] OR ROOM 01 Gender: Female Note Status: Finalized Procedure:            Colonoscopy Indications:          Family history of colonic polyps in a first-degree                        relative Providers:            Lucilla Lame MD, MD Referring MD:         Arnetha Courser (Referring MD) Medicines:            Propofol per Anesthesia Complications:        No immediate complications. Procedure:            Pre-Anesthesia Assessment:                       - Prior to the procedure, a History and Physical was                        performed, and patient medications and allergies were                        reviewed. The patient's tolerance of previous                        anesthesia was also reviewed. The risks and benefits of                        the procedure and the sedation options and risks were                        discussed with the patient. All questions were                        answered, and informed consent was obtained. Prior                        Anticoagulants: The patient has taken no previous                        anticoagulant or antiplatelet agents. ASA Grade                        Assessment: II - A patient with mild systemic disease.                        After reviewing the risks and benefits, the patient was                        deemed in satisfactory condition to undergo the                        procedure.                       After obtaining informed consent, the colonoscope was  passed under direct vision. Throughout the procedure,                        the patient's blood pressure, pulse, and oxygen                        saturations were monitored continuously. The Olympus                        CF-HQ190L Colonoscope  (S#. [id number]) was introduced                        through the anus and advanced to the the cecum,                        identified by appendiceal orifice and ileocecal valve.                        The colonoscopy was performed without difficulty. The                        patient tolerated the procedure well. The quality of                        the bowel preparation was good. Findings:      The perianal and digital rectal examinations were normal.      Two sessile polyps were found in the sigmoid colon. The polyps were 2 to       3 mm in size. These polyps were removed with a cold biopsy forceps.       Resection and retrieval were complete.      Non-bleeding internal hemorrhoids were found during retroflexion. The       hemorrhoids were Grade I (internal hemorrhoids that do not prolapse). Impression:           - Two 2 to 3 mm polyps in the sigmoid colon, removed                        with a cold biopsy forceps. Resected and retrieved.                       - Non-bleeding internal hemorrhoids. Recommendation:       - Repeat colonoscopy in 5 years for surveillance.                       - Discharge patient to home.                       - Resume previous diet.                       - Continue present medications.                       - Await pathology results. Procedure Code(s):    --- Professional ---                       [phone], Colonoscopy, flexible; with biopsy, single or                        multiple  Diagnosis Code(s):    --- Professional ---                       Z83.71, Family history of colonic polyps                       D12.5, Benign neoplasm of sigmoid colon CPT copyright 2016 American Medical Association. All rights reserved. The codes documented in this report are preliminary and upon coder review may  be revised to meet current compliance requirements. Lucilla Lame MD, MD 01/05/2016 9:54:17 AM This report has been signed electronically. Number of Addenda:  0 Note Initiated On: 01/05/2016 9:31 AM Scope Withdrawal Time: 0 hours 6 minutes 16 seconds  Total Procedure Duration: 0 hours 9 minutes 17 seconds       [facility]

## 2016-01-08 ENCOUNTER — Telehealth: Payer: Self-pay | Admitting: Family Medicine

## 2016-01-08 ENCOUNTER — Encounter: Payer: Self-pay | Admitting: Gastroenterology

## 2016-01-08 NOTE — Telephone Encounter (Signed)
Pt called wanted to obtain a copy of her TB skin test. She will be here around 1:30 today.

## 2016-01-08 NOTE — Telephone Encounter (Signed)
Printed and placed up front in file

## 2016-01-10 ENCOUNTER — Encounter: Payer: Self-pay | Admitting: Gastroenterology

## 2016-02-12 ENCOUNTER — Telehealth: Payer: Self-pay | Admitting: *Deleted

## 2016-02-12 NOTE — Telephone Encounter (Addendum)
Pt states her foot is still painful and she is scheduled for a PT test with [employer] this weekend and needs a note.  I told pt if she was still having pain after greater then 3 weeks she needed an appt. I told pt we could get her in this week for treatment and she could get her note at the same time.

## 2016-02-13 ENCOUNTER — Ambulatory Visit (INDEPENDENT_AMBULATORY_CARE_PROVIDER_SITE_OTHER): Payer: Federal, State, Local not specified - PPO | Admitting: Podiatry

## 2016-02-13 VITALS — BP 130/87 | HR 72 | Resp 16

## 2016-02-13 DIAGNOSIS — M659 Synovitis and tenosynovitis, unspecified: Secondary | ICD-10-CM | POA: Diagnosis not present

## 2016-02-13 DIAGNOSIS — M7752 Other enthesopathy of left foot: Secondary | ICD-10-CM | POA: Diagnosis not present

## 2016-02-13 DIAGNOSIS — M79672 Pain in left foot: Secondary | ICD-10-CM

## 2016-02-13 DIAGNOSIS — M25572 Pain in left ankle and joints of left foot: Secondary | ICD-10-CM

## 2016-02-13 DIAGNOSIS — M722 Plantar fascial fibromatosis: Secondary | ICD-10-CM | POA: Diagnosis not present

## 2016-02-13 MED ORDER — NONFORMULARY OR COMPOUNDED ITEM: 1.0000 g | Freq: Four times a day (QID) | 2 refills | Status: DC

## 2016-02-13 MED ORDER — IBUPROFEN-FAMOTIDINE 800-26.6 MG PO TABS: 1.0000 | ORAL_TABLET | Freq: Three times a day (TID) | ORAL | 1 refills | Status: DC

## 2016-02-13 NOTE — Patient Instructions (Signed)
Plantar Fasciitis Plantar fasciitis is a painful foot condition that affects the heel. It occurs when the band of tissue that connects the toes to the heel bone (plantar fascia) becomes irritated. This can happen after exercising too much or doing other repetitive activities (overuse injury). The pain from plantar fasciitis can range from mild irritation to severe pain that makes it difficult for you to walk or move. The pain is usually worse in the morning or after you have been sitting or lying down for a while. CAUSES This condition may be caused by:  Standing for long periods of time.  Wearing shoes that do not fit.  Doing high-impact activities, including running, aerobics, and ballet.  Being overweight.  Having an abnormal way of walking (gait).  Having tight calf muscles.  Having high arches in your feet.  Starting a new athletic activity. SYMPTOMS The main symptom of this condition is heel pain. Other symptoms include:  Pain that gets worse after activity or exercise.  Pain that is worse in the morning or after resting.  Pain that goes away after you walk for a few minutes. DIAGNOSIS This condition may be diagnosed based on your signs and symptoms. Your health care provider will also do a physical exam to check for:  A tender area on the bottom of your foot.  A high arch in your foot.  Pain when you move your foot.  Difficulty moving your foot. You may also need to have imaging studies to confirm the diagnosis. These can include:  X-rays.  Ultrasound.  MRI. TREATMENT  Treatment for plantar fasciitis depends on the severity of the condition. Your treatment may include:  Rest, ice, and over-the-counter pain medicines to manage your pain.  Exercises to stretch your calves and your plantar fascia.  A splint that holds your foot in a stretched, upward position while you sleep (night splint).  Physical therapy to relieve symptoms and prevent problems in the  future.  Cortisone injections to relieve severe pain.  Extracorporeal shock wave therapy (ESWT) to stimulate damaged plantar fascia with electrical impulses. It is often used as a last resort before surgery.  Surgery, if other treatments have not worked after 12 months. HOME CARE INSTRUCTIONS  Take medicines only as directed by your health care provider.  Avoid activities that cause pain.  Roll the bottom of your foot over a bag of ice or a bottle of cold water. Do this for 20 minutes, 3-4 times a day.  Perform simple stretches as directed by your health care provider.  Try wearing athletic shoes with air-sole or gel-sole cushions or soft shoe inserts.  Wear a night splint while sleeping, if directed by your health care provider.  Keep all follow-up appointments with your health care provider. PREVENTION   Do not perform exercises or activities that cause heel pain.  Consider finding low-impact activities if you continue to have problems.  Lose weight if you need to. The best way to prevent plantar fasciitis is to avoid the activities that aggravate your plantar fascia. SEEK MEDICAL CARE IF:  Your symptoms do not go away after treatment with home care measures.  Your pain gets worse.  Your pain affects your ability to move or do your daily activities.   This information is not intended to replace advice given to you by your health care provider. Make sure you discuss any questions you have with your health care provider.   Document Released: 12/25/2000 Document Revised: 12/21/2014 Document Reviewed: 02/09/2014 Elsevier   Interactive Patient Education 2016 Elsevier Inc.  

## 2016-02-13 NOTE — Progress Notes (Signed)
Subjective: Patient presents today for follow-up evaluation and treatment of plantar fasciitis to the left foot. Patient presents today very frustrated that she's been dealing with the pain and symptoms of this chronic plantar fasciitis for approximately the past year. Patient states that it is inhibiting her ability to exercise, run, and be physically active.  At the moment she is not able to run. Patient states that her heel pain is still sore. Patient notes that she was running this past Saturday, 11/10/2015, and approximately after 1-1/2 miles she noticed a severe pain and tenderness with immediate strain to the plantar fascia of her left foot.   Patient also states that she does have a history of ankle sprain to the left ankle. Patient states that her condition has improved concerning her left ankle instability and pain and tenderness.  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 She states that one week and per month she is in her [employer] doing active training. She also likes to run in her running shoes.  Objective: Physical Exam General: The patient is alert and oriented x3 in no acute distress.  Dermatology: Skin is warm, dry and supple bilateral lower extremities. Negative for open lesions or macerations bilateral.   Vascular: Dorsalis Pedis and Posterior Tibial pulses palpable bilateral.  Capillary fill time is immediate to all digits.  Neurological: Epicritic and protective threshold intact bilateral.   Musculoskeletal: Pain to palpation at the medial calcaneal tubercale and through the insertion extending distally to the medial longitudinal arch of the plantar fascia of the left foot. Significant pain on palpation noted.  All other joints range of motion within normal limits bilateral. Strength 5/5 in all groups bilateral.   Moderate pain on palpation to the medial anterior and lateral aspects of the patient's left ankle.  Assessment: #1 plantar fasciitis left foot -  chronic/severe #2 inflammatory pain in left heel #3 ankle joint synovitis left #4 pain in left foot and ankle.  Plan of Care:  1. Patient evaluated.  2. Today, we discussed in detail the conservative versus surgical management of plantar fasciitis. At the moment patient has exhausted all conservative modalities including, steroid all injections, oral NSAIDS, stretching exercises, shoe gear modifications, arch supports, cryotherapy, and modifications of daily living including reduced activity, rest, and compression. 3. Military functional capacity certificate reviewed in detail and completed. 4. Prescription for anti-inflammatory pain cream through [facility] was placed 5. Prescription for Duexis oral NSAID was placed 6. At the moment the patient is considering surgical intervention at this point due to the non-progressive nature of her symptoms. 7. Patient is to return to clinic in 1 month

## 2016-02-22 DIAGNOSIS — E039 Hypothyroidism, unspecified: Secondary | ICD-10-CM | POA: Diagnosis not present

## 2016-02-29 DIAGNOSIS — R5382 Chronic fatigue, unspecified: Secondary | ICD-10-CM | POA: Diagnosis not present

## 2016-02-29 DIAGNOSIS — E669 Obesity, unspecified: Secondary | ICD-10-CM | POA: Diagnosis not present

## 2016-02-29 DIAGNOSIS — E041 Nontoxic single thyroid nodule: Secondary | ICD-10-CM | POA: Diagnosis not present

## 2016-02-29 DIAGNOSIS — E039 Hypothyroidism, unspecified: Secondary | ICD-10-CM | POA: Diagnosis not present

## 2016-03-15 ENCOUNTER — Ambulatory Visit: Payer: Federal, State, Local not specified - PPO | Admitting: Podiatry

## 2016-03-29 ENCOUNTER — Ambulatory Visit (INDEPENDENT_AMBULATORY_CARE_PROVIDER_SITE_OTHER): Payer: Federal, State, Local not specified - PPO | Admitting: Podiatry

## 2016-03-29 DIAGNOSIS — M79672 Pain in left foot: Secondary | ICD-10-CM

## 2016-03-29 DIAGNOSIS — M722 Plantar fascial fibromatosis: Secondary | ICD-10-CM | POA: Diagnosis not present

## 2016-03-29 NOTE — Patient Instructions (Signed)
Pre-Operative Instructions  Congratulations, you have decided to take an important step to improving your quality of life.  You can be assured that the doctors of [facility] will be with you every step of the way.  1. Plan to be at the surgery center/hospital at least 1 (one) hour prior to your scheduled time unless otherwise directed by the surgical center/hospital staff.  You must have a responsible adult accompany you, remain during the surgery and drive you home.  Make sure you have directions to the surgical center/hospital and know how to get there on time. 2. For hospital based surgery you will need to obtain a history and physical form from your family physician within 1 month prior to the date of surgery- we will give you a form for you primary physician.  3. We make every effort to accommodate the date you request for surgery.  There are however, times where surgery dates or times have to be moved.  We will contact you as soon as possible if a change in schedule is required.   4. No Aspirin/Ibuprofen for one week before surgery.  If you are on aspirin, any non-steroidal anti-inflammatory medications (Mobic, Aleve, Ibuprofen) you should stop taking it 7 days prior to your surgery.  You make take Tylenol  For pain prior to surgery.  5. Medications- If you are taking daily heart and blood pressure medications, seizure, reflux, allergy, asthma, anxiety, pain or diabetes medications, make sure the surgery center/hospital is aware before the day of surgery so they may notify you which medications to take or avoid the day of surgery. 6. No food or drink after midnight the night before surgery unless directed otherwise by surgical center/hospital staff. 7. No alcoholic beverages 24 hours prior to surgery.  No smoking 24 hours prior to or 24 hours after surgery. 8. Wear loose pants or shorts- loose enough to fit over bandages, boots, and casts. 9. No slip on shoes, sneakers are best. 10. Bring  your boot with you to the surgery center/hospital.  Also bring crutches or a walker if your physician has prescribed it for you.  If you do not have this equipment, it will be provided for you after surgery. 11. If you have not been contracted by the surgery center/hospital by the day before your surgery, call to confirm the date and time of your surgery. 12. Leave-time from work may vary depending on the type of surgery you have.  Appropriate arrangements should be made prior to surgery with your employer. 13. Prescriptions will be provided immediately following surgery by your doctor.  Have these filled as soon as possible after surgery and take the medication as directed. 14. Remove nail polish on the operative foot. 15. Wash the night before surgery.  The night before surgery wash the foot and leg well with the antibacterial soap provided and water paying special attention to beneath the toenails and in between the toes.  Rinse thoroughly with water and dry well with a towel.  Perform this wash unless told not to do so by your physician.  Enclosed: 1 Ice pack (please put in freezer the night before surgery)   1 Hibiclens skin cleaner   Pre-op Instructions  If you have any questions regarding the instructions, do not hesitate to call our office.  [place]: [address] , [place] [address] [phone]  [place]: [address] [place], [place] [address] [phone]  [place]: [address]  [place], [place] [address] [phone]   Dr.   Norman Regal DPM, Dr. Matthew Wagoner DPM, Dr. M. Todd Hyatt DPM, Dr. Titorya Stover DPM 

## 2016-03-31 MED ORDER — BETAMETHASONE SOD PHOS & ACET 6 (3-3) MG/ML IJ SUSP: 3.0000 mg | Freq: Once | INTRAMUSCULAR | Status: DC

## 2016-03-31 NOTE — Progress Notes (Signed)
Subjective: Patient presents today for follow-up evaluation and treatment of plantar fasciitis to the left foot. Patient presents today very frustrated that she's been dealing with the pain and symptoms of this chronic plantar fasciitis for approximately the past year. Patient states that it is inhibiting her ability to exercise, run, and be physically active.  At the moment she is not able to run. Patient states that her heel pain is still sore. Patient has not been able to have any relief of symptoms for the past year.All conservative modalities have been exhausted. Patient also states that she does have a history of ankle sprain to the left ankle. Patient states that her condition has improved concerning her left ankle instability and pain and tenderness.  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 She states that one week and per month she is in her [employer] doing active training. She also likes to run in her running shoes.  Objective: Physical Exam General: The patient is alert and oriented x3 in no acute distress.  Dermatology: Skin is warm, dry and supple bilateral lower extremities. Negative for open lesions or macerations bilateral.   Vascular: Dorsalis Pedis and Posterior Tibial pulses palpable bilateral.  Capillary fill time is immediate to all digits.  Neurological: Epicritic and protective threshold intact bilateral.   Musculoskeletal: Pain to palpation at the medial calcaneal tubercale and through the insertion extending distally to the medial longitudinal arch of the plantar fascia of the left foot. Significant pain on palpation noted.  All other joints range of motion within normal limits bilateral. Strength 5/5 in all groups bilateral.   Moderate pain on palpation to the medial anterior and lateral aspects of the patient's left ankle.  Assessment: #1 plantar fasciitis left foot - chronic/severe #2 inflammatory pain in left heel #3 ankle joint synovitis left #4 pain  in left foot and ankle.  Plan of Care:  1. Patient evaluated.  2. Today, we discussed in detail the conservative versus surgical management of plantar fasciitis. At the moment patient has exhausted all conservative modalities including, steroid all injections, oral NSAIDS, stretching exercises, shoe gear modifications, arch supports, cryotherapy, and modifications of daily living including reduced activity, rest, and compression. 3. Military functional capacity certificate reviewed in detail and completed. 4. Injection of 0.5 mL Celestone Soluspan injected in the patient'sleft plantar fascia insertion of the calcaneal tubercle 5.Authorization for surgery initiated today. Surgery will consist of endoscopic plantar fasciotomyleft foot. Details of the procedureand all possible complications were explained. All patient questions were answered. No guarantees were expressed or implied.  6. The patient is also considering EPAT therapy.  7. Return to clinic 1 week postop or make an appointment in [facility] for EPAT therapy treatment.

## 2016-04-15 DIAGNOSIS — J209 Acute bronchitis, unspecified: Secondary | ICD-10-CM | POA: Diagnosis not present

## 2016-04-15 DIAGNOSIS — J0100 Acute maxillary sinusitis, unspecified: Secondary | ICD-10-CM | POA: Diagnosis not present

## 2016-07-05 ENCOUNTER — Ambulatory Visit (INDEPENDENT_AMBULATORY_CARE_PROVIDER_SITE_OTHER): Payer: Federal, State, Local not specified - PPO | Admitting: Family Medicine

## 2016-07-05 ENCOUNTER — Encounter: Payer: Self-pay | Admitting: Family Medicine

## 2016-07-05 VITALS — BP 130/82 | HR 85 | Temp 98.4°F | Resp 18 | Wt 208.2 lb

## 2016-07-05 DIAGNOSIS — J0110 Acute frontal sinusitis, unspecified: Secondary | ICD-10-CM

## 2016-07-05 DIAGNOSIS — J029 Acute pharyngitis, unspecified: Secondary | ICD-10-CM | POA: Diagnosis not present

## 2016-07-05 DIAGNOSIS — R6889 Other general symptoms and signs: Secondary | ICD-10-CM

## 2016-07-05 LAB — POCT INFLUENZA A/B
INFLUENZA B, POC: NEGATIVE
Influenza A, POC: NEGATIVE

## 2016-07-05 MED ORDER — OSELTAMIVIR PHOSPHATE 75 MG PO CAPS: 75.0000 mg | ORAL_CAPSULE | Freq: Two times a day (BID) | ORAL | 0 refills | Status: AC

## 2016-07-05 MED ORDER — AMOXICILLIN-POT CLAVULANATE 875-125 MG PO TABS: 1.0000 | ORAL_TABLET | Freq: Two times a day (BID) | ORAL | 0 refills | Status: AC

## 2016-07-05 MED ORDER — FLUCONAZOLE 150 MG PO TABS: 150.0000 mg | ORAL_TABLET | Freq: Once | ORAL | 0 refills | Status: AC

## 2016-07-05 NOTE — Patient Instructions (Addendum)
You are contagious Try vitamin C (orange juice if not diabetic or vitamin C tablets) and drink green tea to help your immune system during your illness Get plenty of rest and hydration Please do eat yogurt daily or take a probiotic daily for the next month or two We want to replace the healthy germs in the gut If you notice foul, watery diarrhea in the next two months, schedule an appointment RIGHT AWAY Start the Tamiflu today Watch for signs/symptoms of pneumonia, and contact on-call doctor or go to urgent care or the ER

## 2016-07-05 NOTE — Progress Notes (Signed)
BP 130/82   Pulse 85   Temp 98.4 F (36.9 C) (Oral)   Resp 18   Wt 208 lb 3.2 oz (94.4 kg)   SpO2 96%   BMI 33.60 kg/m    Subjective:    Patient ID: Courtney Alvarez, female    DOB: 1975-05-25, 41 y.o.   MRN: [id number]  HPI: Courtney Alvarez is a 41 y.o. female  Chief Complaint  Patient presents with  . 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 Arthritis   . Chronic fatigue syndrome   . GERD (gastroesophageal reflux disease)   . Headache    tension/cluster  . Motion sickness   . Plantar fasciitis of left foot   . PONV (postoperative nausea and vomiting)   . Thyroid disease    Past Surgical History:  Procedure Laterality Date  . BLADDER SURGERY    . CERVICAL BIOPSY  W/ LOOP ELECTRODE EXCISION    . COLONOSCOPY WITH PROPOFOL N/A 01/05/2016   Procedure: COLONOSCOPY WITH PROPOFOL;  Surgeon: Lucilla Lame, MD;  Location: [place];  Service: Endoscopy;  Laterality: N/A;  . POLYPECTOMY  01/05/2016   Procedure: POLYPECTOMY;  Surgeon: Lucilla Lame, MD;  Location: [place];  Service: Endoscopy;;   Family History  Problem Relation Age of Onset  . Arthritis Mother   . COPD Mother   . Thyroid disease Mother   . Arthritis Father   . Hyperlipidemia Father   . Cancer Father     polyp removed    . Hashimoto's thyroiditis Sister   . Arthritis Maternal Grandmother   . Cancer Maternal Grandmother     colon  . Heart disease Maternal Grandmother   . Thyroid disease Maternal Grandmother   . Alcohol abuse Maternal Grandfather   . Arthritis Maternal Grandfather   . Arthritis Paternal Grandmother   . Depression Paternal Grandmother   . Hypertension Paternal Grandmother   . Stroke Paternal Grandmother   . Arthritis Paternal Grandfather   . Heart disease Paternal Grandfather   . Hypertension Paternal Grandfather    Social History  Substance Use Topics  . Smoking status: Former Smoker    Quit date: 06/14/2003  . Smokeless tobacco: Never Used     Comment: quit 06/2003  . Alcohol use 1.2 oz/week    2 Glasses of wine per week     Comment:     Interim medical history since last visit reviewed. Allergies and medications reviewed  Review of Systems Per HPI unless specifically indicated above     Objective:    BP 130/82   Pulse 85   Temp 98.4 F (36.9 C) (Oral)   Resp 18   Wt 208 lb 3.2 oz (94.4 kg)   SpO2 96%   BMI 33.60 kg/m   Wt Readings from  Last 3 Encounters:  07/05/16 208 lb 3.2 oz (94.4 kg)  01/05/16 201 lb (91.2 kg)  07/31/15 211 lb 8 oz (95.9 kg)    Physical Exam  Constitutional: She appears well-developed and well-nourished.  HENT:  Right Ear: Hearing, tympanic membrane, external ear and ear canal normal.  Left Ear: Hearing, tympanic membrane, external ear and ear canal normal.  Nose: Rhinorrhea present. Right sinus exhibits maxillary sinus tenderness. Left sinus exhibits maxillary sinus tenderness.  Mouth/Throat: Oropharynx is clear and moist and mucous membranes are normal. No oropharyngeal exudate.  Eyes: EOM are normal. No scleral icterus.  Cardiovascular: Normal rate and regular rhythm.   Pulmonary/Chest: Effort normal and breath sounds normal.  Skin: She is not diaphoretic. No pallor.  Psychiatric: She has a normal mood and affect. Her behavior is  normal.    Results for orders placed or performed in visit on 07/05/16  POCT Influenza A/B  Result Value Ref Range   Influenza A, POC Negative Negative   Influenza B, POC Negative Negative      Assessment & Plan:   Problem List Items Addressed This Visit    None    Visit Diagnoses    Flu-like symptoms    -  Primary   Relevant Orders   POCT Influenza A/B (Completed)   Acute non-recurrent frontal sinusitis       Relevant Medications   pseudoephedrine-acetaminophen (TYLENOL SINUS) 30-500 MG TABS tablet   Pharyngitis, unspecified etiology           Follow up plan: No Follow-up on file.  An after-visit summary was printed and given to the patient at [facility].  Please see the patient instructions which may contain other information and recommendations beyond what is mentioned above in the assessment and plan.  Meds ordered this encounter  Medications  . Thyroid (LEVOTHYROXINE-LIOTHYRONINE) 120 MG TABS    Sig: Take 120 mg by mouth daily.  . pseudoephedrine-acetaminophen (TYLENOL SINUS) 30-500 MG TABS tablet    Sig: Take 1 tablet by mouth every 6 (six) hours as needed.  Marland Kitchen amoxicillin-clavulanate (AUGMENTIN) 875-125 MG tablet    Sig: Take 1 tablet by mouth 2 (two) times daily.    Dispense:  20 tablet    Refill:  0  . fluconazole (DIFLUCAN) 150 MG tablet    Sig: Take 1 tablet (150 mg total) by mouth once.    Dispense:  1 tablet    Refill:  0  . oseltamivir (TAMIFLU) 75 MG capsule    Sig: Take 1 capsule (75 mg total) by mouth 2 (two) times daily.    Dispense:  10 capsule    Refill:  0    Orders Placed This Encounter  Procedures  . POCT Influenza A/B

## 2016-07-11 ENCOUNTER — Telehealth: Payer: Self-pay | Admitting: Family Medicine

## 2016-07-11 MED ORDER — NYSTATIN 100000 UNIT/GM EX CREA: 1.0000 "application " | TOPICAL_CREAM | Freq: Two times a day (BID) | CUTANEOUS | 0 refills | Status: DC

## 2016-07-11 NOTE — Telephone Encounter (Signed)
Left detail messaged 

## 2016-07-11 NOTE — Telephone Encounter (Signed)
Rx sent 

## 2016-07-11 NOTE — Telephone Encounter (Signed)
Pt was given an antibiotic along with diflucan. She is asking if you could prescribe a cream for the exterior area. It is very itchy and uncomfortable. Please send to [facility] It is okay to leave detailed message due to patient being in class [phone]

## 2016-07-12 ENCOUNTER — Encounter: Payer: Federal, State, Local not specified - PPO | Admitting: Obstetrics and Gynecology

## 2016-07-15 ENCOUNTER — Telehealth: Payer: Self-pay | Admitting: Family Medicine

## 2016-07-15 MED ORDER — FLUCONAZOLE 150 MG PO TABS: 150.0000 mg | ORAL_TABLET | Freq: Once | ORAL | 0 refills | Status: AC

## 2016-07-15 NOTE — Telephone Encounter (Signed)
I sent in a prescription for diflucan (oral) Call if not improving

## 2016-07-15 NOTE — Telephone Encounter (Signed)
Patient stated that she is still experiencing vaginal irritation even after using the nystatin cream that was called in for her on 07/11/16.  Patient stated that the itching is now occurring inside the vagina.  Please advise.

## 2016-07-15 NOTE — Telephone Encounter (Signed)
Left detailed voicemail

## 2016-07-26 ENCOUNTER — Telehealth: Payer: Self-pay | Admitting: *Deleted

## 2016-07-26 NOTE — Telephone Encounter (Signed)
Pt states she is in the [employer] and is required to perform pt, but needs new paperwork completed concerning her chronic foot problem. 07/26/2016-I told pt she would need an appt.

## 2016-08-02 ENCOUNTER — Encounter: Payer: Self-pay | Admitting: Podiatry

## 2016-08-02 ENCOUNTER — Ambulatory Visit (INDEPENDENT_AMBULATORY_CARE_PROVIDER_SITE_OTHER): Payer: Federal, State, Local not specified - PPO | Admitting: Podiatry

## 2016-08-02 DIAGNOSIS — M722 Plantar fascial fibromatosis: Secondary | ICD-10-CM | POA: Diagnosis not present

## 2016-08-02 DIAGNOSIS — M79672 Pain in left foot: Secondary | ICD-10-CM

## 2016-08-03 NOTE — Progress Notes (Signed)
Subjective: Patient presents today for follow-up evaluation and treatment of plantar fasciitis to the left foot. She reports continued pain in the left foot. She states the injection helped temporarily. The creams and anti-inflammatory providing any relief.  Objective: Physical Exam General: The patient is alert and oriented x3 in no acute distress.  Dermatology: Skin is warm, dry and supple bilateral lower extremities. Negative for open lesions or macerations bilateral.   Vascular: Dorsalis Pedis and Posterior Tibial pulses palpable bilateral.  Capillary fill time is immediate to all digits.  Neurological: Epicritic and protective threshold intact bilateral.   Musculoskeletal: Pain to palpation at the medial calcaneal tubercale and through the insertion extending distally to the medial longitudinal arch of the plantar fascia of the left foot. Significant pain on palpation noted.  All other joints range of motion within normal limits bilateral. Strength 5/5 in all groups bilateral.   Moderate pain on palpation to the medial anterior and lateral aspects of the patient's left ankle.  Assessment: #1 plantar fasciitis left foot - chronic/severe x 2 years #2 inflammatory pain in left heel  Plan of Care:  1. Patient evaluated.  2. Night splint dispensed. 3. Cam boot dispensed 4. Discussed orthotics 5. Discussed treatment plans for EPAT 6. Patient does not want surgery 7. Return to clinic in 6 weeks

## 2016-08-22 DIAGNOSIS — E039 Hypothyroidism, unspecified: Secondary | ICD-10-CM | POA: Diagnosis not present

## 2016-08-28 ENCOUNTER — Other Ambulatory Visit: Payer: Self-pay | Admitting: Obstetrics and Gynecology

## 2016-08-28 ENCOUNTER — Encounter: Payer: Self-pay | Admitting: *Deleted

## 2016-08-28 ENCOUNTER — Ambulatory Visit (INDEPENDENT_AMBULATORY_CARE_PROVIDER_SITE_OTHER): Payer: Federal, State, Local not specified - PPO | Admitting: Obstetrics and Gynecology

## 2016-08-28 ENCOUNTER — Encounter: Payer: Self-pay | Admitting: Obstetrics and Gynecology

## 2016-08-28 VITALS — BP 115/78 | HR 75 | Ht 66.0 in | Wt 209.5 lb

## 2016-08-28 DIAGNOSIS — Z01411 Encounter for gynecological examination (general) (routine) with abnormal findings: Secondary | ICD-10-CM

## 2016-08-28 DIAGNOSIS — Z01419 Encounter for gynecological examination (general) (routine) without abnormal findings: Secondary | ICD-10-CM | POA: Diagnosis not present

## 2016-08-28 NOTE — Patient Instructions (Signed)
 Preventive Care 18-39 Years, Female Preventive care refers to lifestyle choices and visits with your health care provider that can promote health and wellness. What does preventive care include?  A yearly physical exam. This is also called an annual well check.  Dental exams once or twice a year.  Routine eye exams. Ask your health care provider how often you should have your eyes checked.  Personal lifestyle choices, including:  Daily care of your teeth and gums.  Regular physical activity.  Eating a healthy diet.  Avoiding tobacco and drug use.  Limiting alcohol use.  Practicing safe sex.  Taking vitamin and mineral supplements as recommended by your health care provider. What happens during an annual well check? The services and screenings done by your health care provider during your annual well check will depend on your age, overall health, lifestyle risk factors, and family history of disease. Counseling  Your health care provider may ask you questions about your:  Alcohol use.  Tobacco use.  Drug use.  Emotional well-being.  Home and relationship well-being.  Sexual activity.  Eating habits.  Work and work environment.  Method of birth control.  Menstrual cycle.  Pregnancy history. Screening  You may have the following tests or measurements:  Height, weight, and BMI.  Diabetes screening. This is done by checking your blood sugar (glucose) after you have not eaten for a while (fasting).  Blood pressure.  Lipid and cholesterol levels. These may be checked every 5 years starting at age 20.  Skin check.  Hepatitis C blood test.  Hepatitis B blood test.  Sexually transmitted disease (STD) testing.  BRCA-related cancer screening. This may be done if you have a family history of breast, ovarian, tubal, or peritoneal cancers.  Pelvic exam and Pap test. This may be done every 3 years starting at age 21. Starting at age 30, this may be done  every 5 years if you have a Pap test in combination with an HPV test. Discuss your test results, treatment options, and if necessary, the need for more tests with your health care provider. Vaccines  Your health care provider may recommend certain vaccines, such as:  Influenza vaccine. This is recommended every year.  Tetanus, diphtheria, and acellular pertussis (Tdap, Td) vaccine. You may need a Td booster every 10 years.  Varicella vaccine. You may need this if you have not been vaccinated.  HPV vaccine. If you are 26 or younger, you may need three doses over 6 months.  Measles, mumps, and rubella (MMR) vaccine. You may need at least one dose of MMR. You may also need a second dose.  Pneumococcal 13-valent conjugate (PCV13) vaccine. You may need this if you have certain conditions and were not previously vaccinated.  Pneumococcal polysaccharide (PPSV23) vaccine. You may need one or two doses if you smoke cigarettes or if you have certain conditions.  Meningococcal vaccine. One dose is recommended if you are age 19-21 years and a first-year college student living in a residence hall, or if you have one of several medical conditions. You may also need additional booster doses.  Hepatitis A vaccine. You may need this if you have certain conditions or if you travel or work in places where you may be exposed to hepatitis A.  Hepatitis B vaccine. You may need this if you have certain conditions or if you travel or work in places where you may be exposed to hepatitis B.  Haemophilus influenzae type b (Hib) vaccine. You may need   this if you have certain risk factors. Talk to your health care provider about which screenings and vaccines you need and how often you need them. This information is not intended to replace advice given to you by your health care provider. Make sure you discuss any questions you have with your health care provider. Document Released: 05/28/2001 Document Revised:  12/20/2015 Document Reviewed: 01/31/2015 Elsevier Interactive Patient Education  2017 Elsevier Inc.  

## 2016-08-28 NOTE — Progress Notes (Signed)
Subjective:   Courtney Alvarez is a 41 y.o. G67P1 Caucasian female here for a routine well-woman exam.  No LMP recorded. Patient is not currently having periods (Reason: IUD).    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 Tobacco/alcohol: no tobacco use Illicit drugs: no history of illicit drug use  The following portions of the patient's history were reviewed and updated as appropriate: allergies, current medications, past family history, past medical history, past social history, past surgical history and problem list.  Past Medical History Past Medical History:  Diagnosis Date  . Arthritis   . Chronic fatigue syndrome   . GERD (gastroesophageal reflux disease)   . Headache    tension/cluster  . Hypothyroidism   . Motion sickness   . Plantar fasciitis of left foot   . PONV (postoperative nausea and vomiting)   . Thyroid disease   . Vaginal Pap smear, abnormal     Past Surgical History Past Surgical History:  Procedure Laterality Date  . BLADDER SURGERY    . CERVICAL BIOPSY  W/ LOOP ELECTRODE EXCISION    . COLONOSCOPY WITH PROPOFOL N/A 01/05/2016   Procedure: COLONOSCOPY WITH PROPOFOL;  Surgeon: Lucilla Lame, MD;  Location: [facility];  Service: Endoscopy;  Laterality: N/A;  . POLYPECTOMY  01/05/2016   Procedure: POLYPECTOMY;  Surgeon: Lucilla Lame, MD;  Location: [facility];  Service: Endoscopy;;    Gynecologic History G1P1  No LMP recorded. Patient is not currently having periods (Reason: IUD). Contraception: IUD Last Pap: ?Marland Kitchen Results were: normal Last mammogram: 2016. Results were: normal  Obstetric History OB History  Gravida Para Term Preterm AB Living  1 1          SAB TAB Ectopic Multiple Live Births               # Outcome Date GA Lbr Len/2nd Weight Sex Delivery  Anes PTL Lv  1 Para 2006    F Vag-Spont         Current Medications Current Outpatient Prescriptions on File Prior to Visit  Medication Sig Dispense Refill  . calcium carbonate (TUMS - DOSED IN MG ELEMENTAL CALCIUM) 500 MG chewable tablet Chew 1 tablet by mouth daily.    . Cholecalciferol (PA VITAMIN D-3 GUMMY PO) Take by mouth.    . levonorgestrel (MIRENA) 20 MCG/24HR IUD 1 each by Intrauterine route once.    . Multiple Vitamins-Minerals (MULTIVITAMIN GUMMIES WOMENS) CHEW Chew by mouth.    . pseudoephedrine-acetaminophen (TYLENOL SINUS) 30-500 MG TABS tablet Take 1 tablet by mouth every 6 (six) hours as needed.    . ranitidine (ZANTAC) 150 MG tablet Take 150 mg by mouth as needed for heartburn.    . nystatin cream (MYCOSTATIN) Apply 1 application topically 2 (two) times daily. (Patient not taking: Reported on 08/28/2016) 30 g 0   Current Facility-Administered Medications on File Prior to Visit  Medication Dose Route Frequency Provider Last Rate Last Dose  . betamethasone acetate-betamethasone sodium phosphate (CELESTONE) injection 12 mg  12 mg Intramuscular Once Daylene Katayama M, DPM      . betamethasone acetate-betamethasone sodium phosphate (CELESTONE) injection 3 mg  3 mg Intramuscular Once Daylene Katayama M, DPM      . triamcinolone acetonide (KENALOG) 10 MG/ML injection 10 mg  10 mg Other Once Landis Martins, DPM        Review  of Systems Patient denies any headaches, blurred vision, shortness of breath, chest pain, abdominal pain, problems with bowel movements, urination, or intercourse.  Objective:  BP 115/78   Pulse 75   Ht 5\' 6"  (1.676 m)   Wt 209 lb 8 oz (95 kg)   BMI 33.81 kg/m  Physical Exam  General:  Well developed, well nourished, no acute distress. She is alert and oriented x3. Skin:  Warm and dry Neck:  Midline trachea, no thyromegaly or nodules Cardiovascular: Regular rate and rhythm, no murmur heard Lungs:  Effort normal, all lung fields clear to auscultation  bilaterally Breasts:  No dominant palpable mass, retraction, or nipple discharge Abdomen:  Soft, non tender, no hepatosplenomegaly or masses Pelvic:  External genitalia is normal in appearance.  The vagina is normal in appearance. The cervix is bulbous, no CMT.  Thin prep pap is done with HR HPV cotesting. Uterus is felt to be normal size, shape, and contour.  No adnexal masses or tenderness noted. IUD string noted Extremities:  No swelling or varicosities noted Psych:  She has a normal mood and affect  Assessment:   Healthy well-woman exam IUD check   Plan:  Labs obtained. F/U 1 year for AE, or sooner if needed Mammogram ordered  Mikka Kissner Rockney Ghee, CNM

## 2016-08-29 DIAGNOSIS — E669 Obesity, unspecified: Secondary | ICD-10-CM | POA: Diagnosis not present

## 2016-08-29 DIAGNOSIS — E039 Hypothyroidism, unspecified: Secondary | ICD-10-CM | POA: Diagnosis not present

## 2016-08-29 DIAGNOSIS — E041 Nontoxic single thyroid nodule: Secondary | ICD-10-CM | POA: Diagnosis not present

## 2016-08-29 DIAGNOSIS — R5382 Chronic fatigue, unspecified: Secondary | ICD-10-CM | POA: Diagnosis not present

## 2016-08-29 LAB — COMPREHENSIVE METABOLIC PANEL
ALBUMIN: 4.2 g/dL (ref 3.5–5.5)
ALK PHOS: 66 IU/L (ref 39–117)
ALT: 12 IU/L (ref 0–32)
AST: 19 IU/L (ref 0–40)
Albumin/Globulin Ratio: 1.4 (ref 1.2–2.2)
BUN / CREAT RATIO: 16 (ref 9–23)
BUN: 13 mg/dL (ref 6–24)
Bilirubin Total: 0.3 mg/dL (ref 0.0–1.2)
CO2: 27 mmol/L (ref 18–29)
Calcium: 9.6 mg/dL (ref 8.7–10.2)
Chloride: 100 mmol/L (ref 96–106)
Creatinine, Ser: 0.8 mg/dL (ref 0.57–1.00)
GFR calc Af Amer: 106 mL/min/{1.73_m2} (ref >59)
GFR calc non Af Amer: 92 mL/min/{1.73_m2} (ref >59)
GLOBULIN, TOTAL: 2.9 g/dL (ref 1.5–4.5)
Glucose: 54 mg/dL — ABNORMAL LOW (ref 65–99)
POTASSIUM: 4.1 mmol/L (ref 3.5–5.2)
Sodium: 141 mmol/L (ref 134–144)
Total Protein: 7.1 g/dL (ref 6.0–8.5)

## 2016-08-29 LAB — HEMOGLOBIN A1C
Est. average glucose Bld gHb Est-mCnc: 103 mg/dL
HEMOGLOBIN A1C: 5.2 % (ref 4.8–5.6)

## 2016-08-29 LAB — LIPID PANEL
CHOL/HDL RATIO: 2.7 ratio (ref 0.0–4.4)
Cholesterol, Total: 183 mg/dL (ref 100–199)
HDL: 69 mg/dL (ref >39)
LDL Calculated: 93 mg/dL (ref 0–99)
Triglycerides: 106 mg/dL (ref 0–149)
VLDL Cholesterol Cal: 21 mg/dL (ref 5–40)

## 2016-08-30 ENCOUNTER — Telehealth: Payer: Self-pay | Admitting: *Deleted

## 2016-08-30 NOTE — Telephone Encounter (Signed)
-----   Message from Joylene Igo, [place] sent at 08/29/2016  3:10 PM EDT ----- Please let her know all labs are normal

## 2016-08-30 NOTE — Telephone Encounter (Signed)
Mailed info to pt 

## 2016-09-02 LAB — CYTOLOGY - PAP

## 2016-09-13 ENCOUNTER — Ambulatory Visit (INDEPENDENT_AMBULATORY_CARE_PROVIDER_SITE_OTHER): Payer: Federal, State, Local not specified - PPO | Admitting: Podiatry

## 2016-09-13 DIAGNOSIS — M722 Plantar fascial fibromatosis: Secondary | ICD-10-CM | POA: Diagnosis not present

## 2016-09-14 NOTE — Progress Notes (Signed)
Subjective: Patient presents today for follow-up evaluation and treatment of plantar fasciitis to the left foot. She reports she is doing somewhat better. She is interested in orthotics. She states wearing the boot helps as well as icing and stretching.   Objective: Physical Exam General: The patient is alert and oriented x3 in no acute distress.  Dermatology: Skin is warm, dry and supple bilateral lower extremities. Negative for open lesions or macerations bilateral.   Vascular: Dorsalis Pedis and Posterior Tibial pulses palpable bilateral.  Capillary fill time is immediate to all digits.  Neurological: Epicritic and protective threshold intact bilateral.   Musculoskeletal: Pain to palpation at the medial calcaneal tubercale and through the insertion extending distally to the medial longitudinal arch of the plantar fascia of the left foot. Significant pain on palpation noted.  All other joints range of motion within normal limits bilateral. Strength 5/5 in all groups bilateral.   Moderate pain on palpation to the medial anterior and lateral aspects of the patient's left ankle.  Assessment: #1 plantar fasciitis left foot   Plan of Care:  1. Patient evaluated.  2. Plantar fasciitis brace dispensed. 3. Appointment with Liliane Channel for custom molded orthotics. 4. Return to clinic in 8 weeks.

## 2016-09-18 ENCOUNTER — Ambulatory Visit (INDEPENDENT_AMBULATORY_CARE_PROVIDER_SITE_OTHER): Payer: Federal, State, Local not specified - PPO | Admitting: Podiatry

## 2016-09-18 DIAGNOSIS — M79672 Pain in left foot: Secondary | ICD-10-CM

## 2016-09-18 DIAGNOSIS — M79671 Pain in right foot: Secondary | ICD-10-CM | POA: Diagnosis not present

## 2016-09-19 NOTE — Progress Notes (Signed)
Patient came in today for evaluation/assessment custom foot orthotics.  Patient presents foot pain and discomfort associated with plantar fasciitis.  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 Marland KitchenMarland KitchenMarland KitchenGoal is to "bring ground up" with contoured arch support, and 4 degree valgus RF and FF posting.  Patient also complained of posterior heel pain, plan on heel raise 1/8" bilat to relieve achilles pressure at insertion and heel punch/cushion...  [employer] to fab  [id number]

## 2016-10-02 ENCOUNTER — Ambulatory Visit
Admission: RE | Admit: 2016-10-02 | Discharge: 2016-10-02 | Disposition: A | Discharge status: Home / Self Care | Payer: Federal, State, Local not specified - PPO | Source: Ambulatory Visit | Attending: Obstetrics and Gynecology | Admitting: Obstetrics and Gynecology

## 2016-10-02 DIAGNOSIS — Z1231 Encounter for screening mammogram for malignant neoplasm of breast: Secondary | ICD-10-CM | POA: Diagnosis not present

## 2016-10-02 DIAGNOSIS — Z01411 Encounter for gynecological examination (general) (routine) with abnormal findings: Secondary | ICD-10-CM

## 2016-10-03 ENCOUNTER — Other Ambulatory Visit: Payer: Self-pay | Admitting: Obstetrics and Gynecology

## 2016-10-03 DIAGNOSIS — R928 Other abnormal and inconclusive findings on diagnostic imaging of breast: Secondary | ICD-10-CM

## 2016-10-11 ENCOUNTER — Ambulatory Visit
Admission: RE | Admit: 2016-10-11 | Discharge: 2016-10-11 | Disposition: A | Discharge status: Home / Self Care | Payer: Federal, State, Local not specified - PPO | Source: Ambulatory Visit | Attending: Obstetrics and Gynecology | Admitting: Obstetrics and Gynecology

## 2016-10-11 DIAGNOSIS — R928 Other abnormal and inconclusive findings on diagnostic imaging of breast: Secondary | ICD-10-CM | POA: Diagnosis not present

## 2016-10-11 DIAGNOSIS — N6489 Other specified disorders of breast: Secondary | ICD-10-CM | POA: Diagnosis not present

## 2016-10-17 ENCOUNTER — Ambulatory Visit (INDEPENDENT_AMBULATORY_CARE_PROVIDER_SITE_OTHER): Payer: Federal, State, Local not specified - PPO | Admitting: Orthotics

## 2016-10-17 DIAGNOSIS — M722 Plantar fascial fibromatosis: Secondary | ICD-10-CM

## 2016-11-06 ENCOUNTER — Ambulatory Visit (INDEPENDENT_AMBULATORY_CARE_PROVIDER_SITE_OTHER): Payer: Federal, State, Local not specified - PPO | Admitting: Podiatry

## 2016-11-06 DIAGNOSIS — M722 Plantar fascial fibromatosis: Secondary | ICD-10-CM

## 2016-11-06 NOTE — Progress Notes (Signed)
Patient pick up refurb F/O ....she was pleased w/ fit and function.

## 2016-11-11 ENCOUNTER — Encounter: Payer: Self-pay | Admitting: Family Medicine

## 2016-11-11 ENCOUNTER — Ambulatory Visit (INDEPENDENT_AMBULATORY_CARE_PROVIDER_SITE_OTHER): Payer: Federal, State, Local not specified - PPO | Admitting: Family Medicine

## 2016-11-11 VITALS — BP 118/70 | HR 77 | Temp 98.4°F | Resp 14 | Wt 212.3 lb

## 2016-11-11 DIAGNOSIS — L237 Allergic contact dermatitis due to plants, except food: Secondary | ICD-10-CM

## 2016-11-11 DIAGNOSIS — J029 Acute pharyngitis, unspecified: Secondary | ICD-10-CM

## 2016-11-11 LAB — POCT RAPID STREP A (OFFICE): RAPID STREP A SCREEN: NEGATIVE

## 2016-11-11 NOTE — Progress Notes (Signed)
BP 118/70   Pulse 77   Temp 98.4 F (36.9 C) (Oral)   Resp 14   Wt 212 lb 4.8 oz (96.3 kg)   SpO2 97%   BMI 34.27 kg/m    Subjective:    Patient ID: Courtney Alvarez, female    DOB: 10/24/1975, 41 y.o.   MRN: [id number]  HPI: [name] is a 41 y.o. female  Chief Complaint  Patient presents with  . Sore Throat    Saturday; no fever some sinus pressure and drainage, hurts when pt swallow  . Fatigue    saturday; no fever, some sinus pressure and drainage     HPI Started to get sick on Saturday, sore throat, fatigue, headaches; no fevers Throat really hurts No visible rash Sinus pressure; all over the along paranasal sinuses; right side throat is more painful Left ear feels like in a tunnel Having headaches, not rigid neck Using sudafed and acetaminophen; lots of water Bunch of bug bites, was working in the yard Daughter was sick last week with fever and sore throat; gone after 2 days  Depression screen [facility] 2/9 11/11/2016 07/05/2016 07/31/2015 12/16/2014  Decreased Interest 0 0 0 0  Down, Depressed, Hopeless 0 0 0 0  PHQ - 2 Score 0 0 0 0    Relevant past medical, surgical, family and social history reviewed Past Medical History:  Diagnosis Date  . Arthritis   . Chronic fatigue syndrome   . GERD (gastroesophageal reflux disease)   . Headache    tension/cluster  . Hypothyroidism   . Motion sickness   . Plantar fasciitis of left foot   . PONV (postoperative nausea and vomiting)   . Thyroid disease   . Vaginal Pap smear, abnormal    Past Surgical History:  Procedure Laterality Date  . BLADDER SURGERY    . CERVICAL BIOPSY  W/ LOOP ELECTRODE EXCISION    . COLONOSCOPY WITH PROPOFOL N/A 01/05/2016   Procedure: COLONOSCOPY WITH PROPOFOL;  Surgeon: Lucilla Lame, MD;  Location: [facility];  Service: Endoscopy;  Laterality: N/A;  . POLYPECTOMY  01/05/2016   Procedure: POLYPECTOMY;  Surgeon: Lucilla Lame, MD;  Location: [facility];  Service: Endoscopy;;    Family History  Problem Relation Age of Onset  . Arthritis Mother   . COPD Mother   . Thyroid disease Mother   . Arthritis Father   . Hyperlipidemia Father   . Cancer Father        polyp removed  . Hashimoto's thyroiditis Sister   . Arthritis Maternal Grandmother   . Cancer Maternal Grandmother        colon  . Heart disease Maternal Grandmother   . Thyroid disease Maternal Grandmother   . Alcohol abuse Maternal Grandfather   . Arthritis Maternal Grandfather   . Arthritis Paternal Grandmother   . Depression Paternal Grandmother   . Hypertension Paternal Grandmother   . Stroke Paternal Grandmother   . Arthritis Paternal Grandfather   . Heart disease Paternal Grandfather   . Hypertension Paternal Grandfather   . Breast cancer Neg Hx    Social History   Social History  . Marital status: Married    Spouse name: N/A  . Number of children: N/A  . Years of education: N/A   Occupational History  . Not on file.   Social History Main Topics  . Smoking status: Former Smoker    Quit date: 06/14/2003  . Smokeless tobacco: Never Used     Comment:  quit 06/2003  . Alcohol use 1.2 oz/week    2 Glasses of wine per week     Comment: occasionally   . Drug use: No  . Sexual activity: Yes    Partners: Male    Birth control/ protection: IUD     Comment: mirena   Other Topics Concern  . Not on file   Social History Narrative  . No narrative on file    Interim medical history since last visit reviewed. Allergies and medications reviewed  Review of Systems Per HPI unless specifically indicated above     Objective:    BP 118/70   Pulse 77   Temp 98.4 F (36.9 C) (Oral)   Resp 14   Wt 212 lb 4.8 oz (96.3 kg)   SpO2 97%   BMI 34.27 kg/m   Wt Readings from Last 3 Encounters:  11/11/16 212 lb 4.8 oz (96.3 kg)  08/28/16 209 lb 8 oz (95 kg)  07/05/16 208 lb 3.2 oz (94.4 kg)    Physical Exam  Constitutional: She appears well-developed and well-nourished. No distress.   HENT:  Right Ear: External ear normal.  Left Ear: External ear normal.  Nose: Nose normal.  Mouth/Throat: Mucous membranes are normal. Posterior oropharyngeal edema and posterior oropharyngeal erythema present. No oropharyngeal exudate.  Cerumen occluding view of both TMs; tonsilloliths but no exudate  Cardiovascular: Normal rate and regular rhythm.   Pulmonary/Chest: Effort normal and breath sounds normal.  Abdominal: Normal appearance and bowel sounds are normal. There is no hepatosplenomegaly. There is no tenderness.  Lymphadenopathy:    She has cervical adenopathy.       Right cervical: Superficial cervical and posterior cervical adenopathy present.       Left cervical: Superficial cervical and posterior cervical adenopathy present.  Skin: She is not diaphoretic. No pallor.  Multiple erythematous lesions on the legs and arms, one linear array lower right leg  Psychiatric: Her mood appears not anxious.    Results for orders placed or performed in visit on 11/11/16  POCT rapid strep A  Result Value Ref Range   Rapid Strep A Screen Negative Negative      Assessment & Plan:   Problem List Items Addressed This Visit    None    Visit Diagnoses    Sore throat    -  Primary   ddx includes strep (rapid neg, culture pending), mono, CMV, other viral etiologies; treatment discussed; hydration, call if not improving   Relevant Orders   POCT rapid strep A (Completed)   Culture, Group A Strep   Allergic contact dermatitis due to plants, except food       suspect remnants of poison ivy on the extremities; nothing resembling tick-borne disease       Follow up plan: No Follow-up on file.  An after-visit summary was printed and given to the patient at [facility].  Please see the patient instructions which may contain other information and recommendations beyond what is mentioned above in the assessment and plan.  Meds ordered this encounter  Medications  . vitamin B-12  (CYANOCOBALAMIN) 1000 MCG tablet    Sig: Take 1,000 mcg by mouth daily.    Orders Placed This Encounter  Procedures  . Culture, Group A Strep  . POCT rapid strep A

## 2016-11-11 NOTE — Patient Instructions (Addendum)
We'll get the strep culture going If you are not better in a few days (Wed or Thurs), I recommend testing for mono and CMV Gargle with salt water several times a day Please call if getting worse, and we'll do labs Try vitamin C (orange juice if not diabetic or vitamin C tablets) and drink green tea to help your immune system during your illness Get plenty of rest and hydration  Sore Throat A sore throat is pain, burning, irritation, or scratchiness in the throat. When you have a sore throat, you may feel pain or tenderness in your throat when you swallow or talk. Many things can cause a sore throat, including:  An infection.  Seasonal allergies.  Dryness in the air.  Irritants, such as smoke or pollution.  Gastroesophageal reflux disease (GERD).  A tumor.  A sore throat is often the first sign of another sickness. It may happen with other symptoms, such as coughing, sneezing, fever, and swollen neck glands. Most sore throats go away without medical treatment. Follow these instructions at home:  Take over-the-counter medicines only as told by your health care provider.  Drink enough fluids to keep your urine clear or pale yellow.  Rest as needed.  To help with pain, try: ? Sipping warm liquids, such as broth, herbal tea, or warm water. ? Eating or drinking cold or frozen liquids, such as frozen ice pops. ? Gargling with a salt-water mixture 3-4 times a day or as needed. To make a salt-water mixture, completely dissolve -1 tsp of salt in 1 cup of warm water. ? Sucking on hard candy or throat lozenges. ? Putting a cool-mist humidifier in your bedroom at night to moisten the air. ? Sitting in the bathroom with the door closed for 5-10 minutes while you run hot water in the shower.  Do not use any tobacco products, such as cigarettes, chewing tobacco, and e-cigarettes. If you need help quitting, ask your health care provider. Contact a health care provider if:  You have a fever  for more than 2-3 days.  You have symptoms that last (are persistent) for more than 2-3 days.  Your throat does not get better within 7 days.  You have a fever and your symptoms suddenly get worse. Get help right away if:  You have difficulty breathing.  You cannot swallow fluids, soft foods, or your saliva.  You have increased swelling in your throat or neck.  You have persistent nausea and vomiting. This information is not intended to replace advice given to you by your health care provider. Make sure you discuss any questions you have with your health care provider. Document Released: 05/09/2004 Document Revised: 11/26/2015 Document Reviewed: 01/20/2015 Elsevier Interactive Patient Education  [employer].

## 2016-11-13 LAB — CULTURE, GROUP A STREP

## 2016-12-11 ENCOUNTER — Ambulatory Visit (INDEPENDENT_AMBULATORY_CARE_PROVIDER_SITE_OTHER): Payer: Federal, State, Local not specified - PPO

## 2016-12-11 ENCOUNTER — Encounter (INDEPENDENT_AMBULATORY_CARE_PROVIDER_SITE_OTHER): Payer: Self-pay | Admitting: Orthopedic Surgery

## 2016-12-11 ENCOUNTER — Ambulatory Visit (INDEPENDENT_AMBULATORY_CARE_PROVIDER_SITE_OTHER): Payer: Federal, State, Local not specified - PPO | Admitting: Orthopedic Surgery

## 2016-12-11 DIAGNOSIS — M542 Cervicalgia: Secondary | ICD-10-CM

## 2016-12-11 DIAGNOSIS — G8929 Other chronic pain: Secondary | ICD-10-CM | POA: Diagnosis not present

## 2016-12-11 DIAGNOSIS — M545 Low back pain: Secondary | ICD-10-CM

## 2016-12-11 MED ORDER — METHYLPREDNISOLONE 4 MG PO TABS: ORAL_TABLET | ORAL | 0 refills | Status: DC

## 2016-12-11 MED ORDER — METHOCARBAMOL 500 MG PO TABS: 500.0000 mg | ORAL_TABLET | Freq: Three times a day (TID) | ORAL | 0 refills | Status: DC | PRN
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
## 2016-12-18 ENCOUNTER — Telehealth (INDEPENDENT_AMBULATORY_CARE_PROVIDER_SITE_OTHER): Payer: Self-pay

## 2016-12-18 DIAGNOSIS — Z7689 Persons encountering health services in other specified circumstances: Secondary | ICD-10-CM

## 2016-12-18 NOTE — Telephone Encounter (Signed)
Patient would like to know if there is a PT close to [place], [place]?  Cb# is [phone].  Please advise.  Thank You.

## 2016-12-18 NOTE — Telephone Encounter (Signed)
Tried calling patient back. LM advising order submitted.

## 2016-12-18 NOTE — Addendum Note (Signed)
Addended by: Laurann Montana on: 12/18/2016 11:14 AM   Modules accepted: Orders

## 2016-12-23 ENCOUNTER — Telehealth: Payer: Self-pay | Admitting: Family Medicine

## 2016-12-23 ENCOUNTER — Encounter: Payer: Self-pay | Admitting: Family Medicine

## 2016-12-23 DIAGNOSIS — Z111 Encounter for screening for respiratory tuberculosis: Secondary | ICD-10-CM

## 2016-12-23 NOTE — Telephone Encounter (Signed)
I have placed the order - she may present at any time for blood draw for TB Quant Gold testing. Please call to notify patient. Thanks!

## 2016-12-23 NOTE — Telephone Encounter (Signed)
PT IS NEEDING ORDER FOR TB TEST. PATIENT IS NEEDING BEFORE THE END OF THE MONTH

## 2016-12-23 NOTE — Telephone Encounter (Signed)
Left detailed voicemail

## 2016-12-26 ENCOUNTER — Ambulatory Visit
Admission: RE | Admit: 2016-12-26 | Discharge: 2016-12-26 | Disposition: A | Discharge status: Home / Self Care | Payer: Federal, State, Local not specified - PPO | Source: Ambulatory Visit | Attending: Orthopedic Surgery | Admitting: Orthopedic Surgery

## 2016-12-26 DIAGNOSIS — M4802 Spinal stenosis, cervical region: Secondary | ICD-10-CM | POA: Insufficient documentation

## 2016-12-26 DIAGNOSIS — M50321 Other cervical disc degeneration at C4-C5 level: Secondary | ICD-10-CM | POA: Diagnosis not present

## 2016-12-26 DIAGNOSIS — M542 Cervicalgia: Secondary | ICD-10-CM | POA: Diagnosis not present

## 2016-12-30 DIAGNOSIS — Z111 Encounter for screening for respiratory tuberculosis: Secondary | ICD-10-CM | POA: Diagnosis not present

## 2017-01-01 LAB — QUANTIFERON TB GOLD ASSAY (BLOOD)
QUANTIFERON(R)-TB GOLD: NEGATIVE
Quantiferon Nil Value: 0.13 IU/mL

## 2017-01-08 ENCOUNTER — Ambulatory Visit (INDEPENDENT_AMBULATORY_CARE_PROVIDER_SITE_OTHER): Payer: Federal, State, Local not specified - PPO | Admitting: Orthopedic Surgery

## 2017-01-08 ENCOUNTER — Encounter (INDEPENDENT_AMBULATORY_CARE_PROVIDER_SITE_OTHER): Payer: Self-pay | Admitting: Orthopedic Surgery

## 2017-01-08 DIAGNOSIS — M542 Cervicalgia: Secondary | ICD-10-CM | POA: Diagnosis not present

## 2017-01-08 MED ORDER — TRAMADOL HCL 50 MG PO TABS: 50.0000 mg | ORAL_TABLET | Freq: Two times a day (BID) | ORAL | 0 refills | Status: DC | PRN

## 2017-01-08 NOTE — Progress Notes (Signed)
Office Visit Note   Patient: Courtney Alvarez           Date of Birth: 1975-07-06           MRN: [id number] Visit Date: 01/08/2017 Requested by: Arnetha Courser, MD [address] [place], [place] [address] PCP: Arnetha Courser, MD  Subjective: Chief Complaint  Patient presents with  . Neck - Follow-up    HPI: Courtney Alvarez is a 41 year old female with neck pain.  Since I have seen her she has had an MRI scan which is reviewed with the patient.  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  She states that she has headaches.  She had been managing with chiropractic care but that is becoming ineffective.  Symptoms are now getting worse.  Sometimes the soreness last for a couple of days.  She states her muscles feel tight.  Has some numbness and tingling and shoulder blade symptoms when she rotates her head to the right.  Muscle relaxers not helpful.  Her husband has been deployed.              ROS: All systems reviewed are negative as they relate to the chief complaint within the history of present illness.  Patient denies  fevers or chills.   Assessment & Plan: Visit Diagnoses:  1. Neck pain     Plan: Impression is bilateral neuroforaminal stenosis likely giving Courtney Alvarez her arm and neck symptoms.  Headaches are a little bit more difficult to pin down on this particular problem.  I would like for her to see Dr. Alfonse Spruce for consultation regarding epidural steroid injections.  I want to try her on some Ultram 1 by mouth twice a day for symptoms until that appointment.  I will see her back as needed.  I don't think that her current problems are surgical.  Follow-Up Instructions: No Follow-up on file.   Orders:  Orders Placed This Encounter  Procedures  . Ambulatory referral to Physical Medicine Rehab   Meds ordered this encounter  Medications  . traMADol (ULTRAM) 50 MG tablet    Sig: Take 1 tablet (50 mg total)  by mouth every 12 (twelve) hours as needed.    Dispense:  50 tablet    Refill:  0      Procedures: No procedures performed   Clinical Data: No additional findings.  Objective: Vital Signs: There were no vitals taken for this visit.  Physical Exam:   Constitutional: Patient appears well-developed HEENT:  Head: Normocephalic Eyes:EOM are normal Neck: Normal range of motion Cardiovascular: Normal rate Pulmonary/chest: Effort normal Neurologic: Patient is alert Skin: Skin is warm Psychiatric: Patient has normal mood and affect    Ortho Exam: Orthopedic exam demonstrates pretty reasonable neck range of motion.  5 out of 5 grip EPL FPL interosseous wrist flexion extension biceps triceps and deltoid strength.  No other masses lymph adenopathy or skin changes noted in the neck region.  No other change in exam.  Shoulder strength is excellent bilaterally.  Specialty Comments:  No specialty comments available.  Imaging: No results found.   PMFS History: Patient Active Problem List   Diagnosis Date Noted  . Family history of colonic polyps   . Benign neoplasm of sigmoid colon   . Immunity status testing 08/01/2015  . Family history of colon cancer 07/31/2015  . Annual physical exam 07/31/2015  . Obesity 07/31/2015  . Foot pain 06/22/2015  .  Loss of feeling or sensation 06/22/2015  . Backhand tennis elbow 07/06/2014  . Lateral epicondylitis of left elbow 07/06/2014  . 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[place]) 12/16/2013  . Arthralgia of multiple joints 12/16/2013  . Abnormal weight gain 12/16/2013  . 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 Adult hypothyroidism 09/07/2013   Past Medical History:  Diagnosis Date  . Arthritis   . Chronic fatigue syndrome   . GERD (gastroesophageal reflux disease)   . Headache    tension/cluster  . Hypothyroidism   . Motion sickness   . Plantar fasciitis of left foot   . PONV (postoperative nausea and  vomiting)   . Thyroid disease   . Vaginal Pap smear, abnormal     Family History  Problem Relation Age of Onset  . Arthritis Mother   . COPD Mother   . Thyroid disease Mother   . Arthritis Father   . Hyperlipidemia Father   . Cancer Father        polyp removed  . Hashimoto's thyroiditis Sister   . Arthritis Maternal Grandmother   . Cancer Maternal Grandmother        colon  . Heart disease Maternal Grandmother   . Thyroid disease Maternal Grandmother   . Alcohol abuse Maternal Grandfather   . Arthritis Maternal Grandfather   . Arthritis Paternal Grandmother   . Depression Paternal Grandmother   . Hypertension Paternal Grandmother   . Stroke Paternal Grandmother   . Arthritis Paternal Grandfather   . Heart disease Paternal Grandfather   . Hypertension Paternal Grandfather   . Breast cancer Neg Hx     Past Surgical History:  Procedure Laterality Date  . BLADDER SURGERY    . CERVICAL BIOPSY  W/ LOOP ELECTRODE EXCISION    . COLONOSCOPY WITH PROPOFOL N/A 01/05/2016   Procedure: COLONOSCOPY WITH PROPOFOL;  Surgeon: Lucilla Lame, MD;  Location: [facility];  Service: Endoscopy;  Laterality: N/A;  . POLYPECTOMY  01/05/2016   Procedure: POLYPECTOMY;  Surgeon: Lucilla Lame, MD;  Location: [facility];  Service: Endoscopy;;   Social History   Occupational History  . Not on file.   Social History Main Topics  . Smoking status: Former Smoker    Quit date: 06/14/2003  . Smokeless tobacco: Never Used     Comment: quit 06/2003  . Alcohol use 1.2 oz/week    2 Glasses of wine per week     Comment: occasionally   . Drug use: No  . Sexual activity: Yes    Partners: Male    Birth control/ protection: IUD     Comment: mirena

## 2017-01-15 ENCOUNTER — Ambulatory Visit: Payer: Federal, State, Local not specified - PPO | Admitting: Orthotics

## 2017-01-15 DIAGNOSIS — M79672 Pain in left foot: Secondary | ICD-10-CM

## 2017-01-15 DIAGNOSIS — M7752 Other enthesopathy of left foot: Secondary | ICD-10-CM

## 2017-01-15 NOTE — Progress Notes (Signed)
Courtney Alvarez came in with her F/O needing adjusting.  Her biggest concern is that she has heel slippage and cuboid pain LEFT.   F/O to go back to [facility] with following modifications:  Slim line w/ shallow heel, offload cuboid sweet spot.

## 2017-01-16 DIAGNOSIS — E039 Hypothyroidism, unspecified: Secondary | ICD-10-CM | POA: Diagnosis not present

## 2017-01-20 DIAGNOSIS — E041 Nontoxic single thyroid nodule: Secondary | ICD-10-CM | POA: Diagnosis not present

## 2017-01-23 DIAGNOSIS — E041 Nontoxic single thyroid nodule: Secondary | ICD-10-CM | POA: Diagnosis not present

## 2017-01-23 DIAGNOSIS — R5382 Chronic fatigue, unspecified: Secondary | ICD-10-CM | POA: Diagnosis not present

## 2017-01-23 DIAGNOSIS — E039 Hypothyroidism, unspecified: Secondary | ICD-10-CM | POA: Diagnosis not present

## 2017-01-23 DIAGNOSIS — E669 Obesity, unspecified: Secondary | ICD-10-CM | POA: Diagnosis not present

## 2017-01-29 ENCOUNTER — Ambulatory Visit: Payer: Federal, State, Local not specified - PPO | Attending: Orthopedic Surgery | Admitting: Physical Therapy

## 2017-01-29 ENCOUNTER — Encounter: Payer: Self-pay | Admitting: Physical Therapy

## 2017-01-29 ENCOUNTER — Ambulatory Visit (INDEPENDENT_AMBULATORY_CARE_PROVIDER_SITE_OTHER): Payer: Federal, State, Local not specified - PPO | Admitting: Physical Medicine and Rehabilitation

## 2017-01-29 ENCOUNTER — Encounter (INDEPENDENT_AMBULATORY_CARE_PROVIDER_SITE_OTHER): Payer: Self-pay | Admitting: Physical Medicine and Rehabilitation

## 2017-01-29 VITALS — BP 124/70 | HR 67

## 2017-01-29 DIAGNOSIS — M6281 Muscle weakness (generalized): Secondary | ICD-10-CM | POA: Diagnosis not present

## 2017-01-29 DIAGNOSIS — M5412 Radiculopathy, cervical region: Secondary | ICD-10-CM

## 2017-01-29 DIAGNOSIS — M5010 Cervical disc disorder with radiculopathy, unspecified cervical region: Secondary | ICD-10-CM

## 2017-01-29 DIAGNOSIS — M542 Cervicalgia: Secondary | ICD-10-CM | POA: Diagnosis not present

## 2017-01-29 NOTE — Therapy (Signed)
[place] MAIN [facility] SERVICES [address] [place], [place], [address] Phone: [phone]   Fax:  [phone]  Physical Therapy Evaluation  Patient Details  Name: Courtney Alvarez MRN: [id number] Date of Birth: 11-14-1975 Referring Provider: Meredith Pel  Encounter Date: 01/29/2017      PT End of Session - 01/29/17 1019    Visit Number 1   Number of Visits 17   Date for PT Re-Evaluation 03/26/17   PT Start Time 0848   PT Stop Time 0945   PT Time Calculation (min) 57 min   Activity Tolerance Patient tolerated treatment well;No increased pain   Behavior During Therapy WFL for tasks assessed/performed      Past Medical History:  Diagnosis Date  . Arthritis   . Chronic fatigue syndrome   . GERD (gastroesophageal reflux disease)   . Headache    tension/cluster  . Hypothyroidism   . Motion sickness   . Plantar fasciitis of left foot   . PONV (postoperative nausea and vomiting)   . Thyroid disease   . Vaginal Pap smear, abnormal     Past Surgical History:  Procedure Laterality Date  . BLADDER SURGERY    . CERVICAL BIOPSY  W/ LOOP ELECTRODE EXCISION    . COLONOSCOPY WITH PROPOFOL N/A 01/05/2016   Procedure: COLONOSCOPY WITH PROPOFOL;  Surgeon: Lucilla Lame, MD;  Location: [place];  Service: Endoscopy;  Laterality: N/A;  . POLYPECTOMY  01/05/2016   Procedure: POLYPECTOMY;  Surgeon: Lucilla Lame, MD;  Location: [place];  Service: Endoscopy;;    There were no vitals filed for this visit.       Subjective Assessment - 01/29/17 0858    Subjective Pt states that she has had neck pain for the last several years, but has noticed a significant increase in the last 5 years.  Pt states she was previously very active before onset of pain in cervical spine.  Pt states that her pain is in her neck with constant tightness, with "pins and needles" sensastion going down her arm with numbness affecting the right thumb,  2nd and 3rd digit.  Radiating symptoms isolated to R UE only that began about 6 months ago. Pt has intermittent pain in the cervical spine ranging from at worst 10/10 and at best 5/10.  Pt states that her pain is worse in the morning. She also has pain in her lumbar spine which tends to increase with increased activity levels.  Pt states that with increased pain she has headaches with symptoms in the base of the skull that radiate the the front of skull.   Pertinent History Pt has had symptoms in cervical spine for several years, with increase of symptoms beginning over the last 5 years.  Pt's MRI results revealed disc bulging of C4-5, C5-6 and C6-7.  Pt has hx of headaches, with symptoms increasing as neck pain increases.  Pt has seen a chiropracter in the past for cervical and lumbar pain.  Pt has hx of low back pain and currently has plantarfascitis of L foot.  Pt has past medical hx of arthritis, chronic fatigue syndrome, GERD, headaches, hypothroidism, and thyroid disease.   Limitations Walking;Lifting;Standing;House hold activities;Other (comment)  increased symptoms with recreaational activities such as running and weight training   How long can you sit comfortably? 45 mins - 1 hour    How long can you stand comfortably? able to stand, but has increased pain    How long can  you walk comfortably? able to walk with work activities, has increased pain    Diagnostic tests MRI   Patient Stated Goals "I want to get back to how I was"   Currently in Pain? Yes   Pain Score 5    Pain Location Neck   Pain Orientation Medial   Pain Descriptors / Indicators Aching;Burning   Pain Type Chronic pain   Pain Radiating Towards pain and numbess radiates down R arm   Pain Onset More than a month ago   Pain Frequency Constant   Aggravating Factors  movement, standing, walking, work related activities    Pain Relieving Factors rest, ice, heat, NSAIDS, acetomenophen   Effect of Pain on Daily Activities increased  pain with daily activities       PAIN: 5/10 in cervical spine Worst pain: 10/10, Best pain: 5/10 After session: 3-4/10 - states that she "feels less stiff than when she first came in"  POSTURE: Fwd head, rounded shoulders, protracted scapula, increased upper trap activation with movement of shoulder and neck  PROM/AROM: Cervical:  Flex - 35 deg with increased symptoms going down to R shoulder  Ext - 33 deg   R sidebend - 38 deg   L sidebend - 45 deg   R rotation - 33 deg with increased symptoms traveling down R UE  L rotation - 58 deg with increased tightness Thoracic ROM screen:  WFL's  UE ROM screen: WFL's - no increase of symptoms in cervical spine noted with movement; no radiating symptoms into R UE with shoulder screen  STRENGTH:  Graded on a 0-5 scale Muscle Group Left Right  Shoulder flex 5/5 5/5  Shoulder Abd 5/5 5/5  Shoulder Ext 5/5 5/5  Shoulder IR/ER 5/5 5/5  Elbow 5/5 5/5  Grip 5/5 5/5  Rhomboid 4/5 4-/5  Middle Trap 4/5 4-/5  Lower Trap 3+/5 3+/5   SENSATION: UE sensation intact across all dermatomes   Coordination: Intact with UE coordination   SPECIAL TESTS: No special tests performed  FUNCTIONAL MOBILITY: WFL  Palpation: Mild muscle guarding noted of thoracic paraspinals, middle trap and rhomboid insertions - no pain noted with palpation CPA of C3-T5 - hypomobility noted, increased tenderness with grade 3 mobilizations, able to tolerate grade 2 mobilizations without increase of symptoms  GAIT: Decreased gait speed noted, decreased arm swing and trunk rotation secondary to lower back pain  OUTCOME MEASURES: NDI: 38% indicating moderate disability Mod ODI: 36% indicating moderate disability  Treatment:  There Ex:   Seated scapular retraction x 10 reps with 5 sec holds   Seated cervical retraction x 10 reps with 5 sec holds   Cervical AROM rotation to right x 10 reps with 5 sec holds   Manual:   CPA with grade 2 mobilization C3-T5 -  decreased hypomobility noted after intervention, decreased tenderness after intervention    Cervical manual traction holding suboccipital release 3 x 30 sec holds  After session: 3-4/10 - states that she "feels less stiff than when she first came in"      [facility] PT Assessment - 01/29/17 0001      Assessment   Referring Provider Marcene Duos SCOTT   Hand Dominance Right   Prior Therapy yes     Precautions   Precautions None   Required Braces or Orthoses Other Brace/Splint  has a boot and night splint for L foot plantarfascitis      Restrictions   Weight Bearing Restrictions No     Balance Screen  Has the patient fallen in the past 6 months No   Has the patient had a decrease in activity level because of a fear of falling?  No   Is the patient reluctant to leave their home because of a fear of falling?  No     Home Environment   Living Environment Private residence   Living Arrangements Spouse/significant other;Children   Available Help at Discharge Family;Friend(s)   Type of [place] to enter   Entrance Stairs-Number of Steps 7   Entrance Stairs-Rails Can reach both   Home Layout Two level   Alternate Level Stairs-Rails Right   Home Equipment None     Prior Function   Level of Independence Independent   Vocation Student   Vocation Requirements full time nursing student; [employer]    Leisure exercise             Objective measurements completed on examination: See above findings.                  PT Education - 01/29/17 1018    Education provided Yes   Education Details PT plan of care for neck pain - educated on adding low back pain with reevaulation in future; HEP   Person(s) Educated Patient   Methods Explanation;Demonstration;Verbal cues;Handout   Comprehension Verbalized understanding;Returned demonstration          PT Short Term Goals - 01/29/17 1202      PT SHORT TERM GOAL #1   Title Pt will demonstrate  cervical ROM WFL's without pain or radiating symptoms down R UE.   Baseline Pt limited in all planes with increased symptoms in R UE with flexion, R rotation and R sidebending   Time 4   Period Weeks   Status New   Target Date 02/26/17     PT SHORT TERM GOAL #2   Title Pt will indicate pain of </= 5/10 with activity and cervical movement, in order to demonstrate improved function within pain free range.   Baseline Worst: 10/10, Best: 5/10   Time 4   Period Weeks   Status New   Target Date 02/26/17     PT SHORT TERM GOAL #3   Title Pt will demonstrate improved NDI score of <or= to 20% indicating min-mod disability in order to demonstrate self-reported improvement of pain and mobility.   Baseline 01/29/17: 38%   Time 4   Period Weeks   Status New   Target Date 02/26/17           PT Long Term Goals - 01/29/17 1206      PT LONG TERM GOAL #1   Title Pt will demonstrate compliance with HEP in order to manage pain and symptoms.   Time 8   Period Weeks   Status New   Target Date 03/26/17     PT LONG TERM GOAL #2   Title Pt will indicate pain levels of <or= 2/10 with activity in order to demonstrate improved functional mobility without pain.   Baseline Worst: 10/10, Best: 5/10   Time 8   Period Weeks   Status New   Target Date 03/26/17     PT LONG TERM GOAL #3   Title Pt will demonstrate improve strength of postural musculature in order to facilitate proper sitting and standing posture, allowing for improvment with mobility and daily tasks.   Baseline 01/29/17: L rhomboid and middle trap = 4/5, R rhombodi and middle trap =  4-/5, B lower trap = 3+/5   Time 8   Period Weeks   Status New   Target Date 03/26/17     PT LONG TERM GOAL #5   Title Pt will improve NDI score to <or= to 10% indicating minimal disability level in order to demonstrate self-reported improvement of pain and function.   Baseline 01/29/17: 38% indicating moderate disability   Time 8   Period Weeks    Status New   Target Date 03/26/17                Plan - 01/29/17 1148    Clinical Impression Statement Pt is a 41 y/o female that presents to today's treatment session with increased pain of the cervical region with radiating symptoms traveling down R UE to digits 1-3.  Pt has a NDI score of 38% indicating a moderate level of self-reported disability.  Pt has decreased cervical ROM, with increase of symptoms noted with cervical flexion, R rotation and R sidebending.  Pt also has decreased strength of postural musculature, including B rhomboids, B middle and lower trapezius.  Pt has no increase of tenderness with soft tissue palpation and no increased guarding is noted across cervical spine and thoracic spine.  Pt has increased tenderness with CPA from C3-T5, with increased symptoms with grade 3 mobilizations.  Pt able to tolerate grade 2 mobilizations with decreased symptoms noted after treatment.  Pt also has lumbar pain with increased pain also noted in R hip with increased actvities such as running and lifting weights.  Pt was educated that intial sessions will focus on cervical pain and we will reevaluate back pain in the future if symptoms persist.  Pt will benefit from skilled PT services to improve shoulder girdle strength, improper posture, joint mobility and cervical ROM, as well as to decrease levels of pain in order to improve quality of life, functional mobility and return to recreational activities such as running and weight training for [employer].   History and Personal Factors relevant to plan of care: Fluctuating pain levels, chronic fatiuge syndrome, throid disease, hypotyroidism, multiple pain sites   Clinical Presentation Evolving   Clinical Presentation due to: fluctuating pain, mutiple pain site involvement    Clinical Decision Making Moderate   Rehab Potential Good   Clinical Impairments Affecting Rehab Potential High prior level of function, desire to return  to running and weight training   PT Frequency 2x / week   PT Duration 8 weeks   PT Treatment/Interventions ADLs/Self Care Home Management;Aquatic Therapy;Cryotherapy;Electrical Stimulation;Moist Heat;Traction;Ultrasound;Functional mobility training;Therapeutic activities;Therapeutic exercise;Neuromuscular re-education;Patient/family education;Manual techniques;Passive range of motion;Dry needling;Taping   PT Next Visit Plan begin cervical and postural strength   PT Home Exercise Plan scapular retraction, cervical retraction cervical AROM - R rotation      Patient will benefit from skilled therapeutic intervention in order to improve the following deficits and impairments:  Decreased activity tolerance, Decreased mobility, Decreased range of motion, Decreased strength, Hypomobility, Increased muscle spasms, Postural dysfunction, Pain  Visit Diagnosis: Neck pain  Muscle weakness (generalized)     Problem List Patient Active Problem List   Diagnosis Date Noted  . Family history of colonic polyps   . Benign neoplasm of sigmoid colon   . Immunity status testing 08/01/2015  . Family history of colon cancer 07/31/2015  . Annual physical exam 07/31/2015  . Obesity 07/31/2015  . Foot pain 06/22/2015  . Loss of feeling or sensation 06/22/2015  . Backhand tennis elbow 07/06/2014  .  Lateral epicondylitis of left elbow 07/06/2014  . 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[place]) 12/16/2013  . Arthralgia of multiple joints 12/16/2013  . Abnormal weight gain 12/16/2013  . 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 Adult hypothyroidism 09/07/2013   This entire session was performed under direct supervision and direction of a licensed therapist/therapist assistant . I have personally read, edited and approve of the note as written. Netta Corrigan, SPT Alanson Puls, PT, DPT  01/29/2017, 12:15 PM  [place] MAIN [facility]  SERVICES [address] [place], [place], [address] Phone: [phone]   Fax:  [phone]  Name: Courtney Alvarez MRN: [id number] Date of Birth: [birth year]

## 2017-01-29 NOTE — Progress Notes (Signed)
Right sided neck pain with numbness and tingling in right arm. Has also started having lower back and right hip pain. Numbness is worse first thing in the morning. Pins and needles feeling when driving or when sitting for extended time. First 3 digits.

## 2017-01-29 NOTE — Patient Instructions (Signed)
Scapular Retraction (Standing)    With arms at sides, pinch shoulder blades together. Repeat 10 times per set. Do 1 sets per session. Do 1 sessions per hour .  http://orth.exer.us/945   Copyright  VHI. All rights reserved.  Neck: Retraction    Sit with back and head straight. Pull chin back to line up ear with shoulder. Do not turn or tilt head. Hold 5 seconds. Repeat 10 times. Do at least 3 sessions per day. CAUTION: Movement should be gentle, steady and slow.  Copyright  VHI. All rights reserved.  AROM: Neck Rotation    Turn head slowly to look over one shoulder, then the other. Hold each position 5 seconds. Repeat 10times per set. Do1 sets per session. Do 1 sessions per day.  http://orth.exer.us/295   Copyright  VHI. All rights reserved.

## 2017-02-03 ENCOUNTER — Ambulatory Visit: Payer: Federal, State, Local not specified - PPO

## 2017-02-03 DIAGNOSIS — M542 Cervicalgia: Secondary | ICD-10-CM

## 2017-02-03 DIAGNOSIS — M6281 Muscle weakness (generalized): Secondary | ICD-10-CM | POA: Diagnosis not present

## 2017-02-03 NOTE — Therapy (Signed)
[place] MAIN [facility] SERVICES [address] [place], [place], [address] Phone: [phone]   Fax:  [phone]  Physical Therapy Treatment  Patient Details  Name: Courtney Alvarez MRN: [id number] Date of Birth: [birth year] Referring Provider: Meredith Pel  Encounter Date: 02/03/2017      PT End of Session - 02/03/17 0854    Visit Number 2   Number of Visits 17   Date for PT Re-Evaluation 03/26/17   PT Start Time 0858   PT Stop Time 0937   PT Time Calculation (min) 39 min   Activity Tolerance Patient tolerated treatment well;No increased pain   Behavior During Therapy WFL for tasks assessed/performed      Past Medical History:  Diagnosis Date  . Arthritis   . Chronic fatigue syndrome   . GERD (gastroesophageal reflux disease)   . Headache    tension/cluster  . Hypothyroidism   . Motion sickness   . Plantar fasciitis of left foot   . PONV (postoperative nausea and vomiting)   . Thyroid disease   . Vaginal Pap smear, abnormal     Past Surgical History:  Procedure Laterality Date  . BLADDER SURGERY    . CERVICAL BIOPSY  W/ LOOP ELECTRODE EXCISION    . COLONOSCOPY WITH PROPOFOL N/A 01/05/2016   Procedure: COLONOSCOPY WITH PROPOFOL;  Surgeon: Lucilla Lame, MD;  Location: [place];  Service: Endoscopy;  Laterality: N/A;  . POLYPECTOMY  01/05/2016   Procedure: POLYPECTOMY;  Surgeon: Lucilla Lame, MD;  Location: [place];  Service: Endoscopy;;    There were no vitals filed for this visit.      Subjective Assessment - 02/03/17 0853    Subjective Pt reports that she is doing well today. She reports that her neck was very flared up yesterday. 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 No specific questions or concerns.    Pertinent History Pt has had symptoms in cervical spine for several years, with increase of symptoms beginning over the last 5 years.   Pt's MRI results revealed disc bulging of C4-5, C5-6 and C6-7.  Pt has hx of headaches, with symptoms increasing as neck pain increases.  Pt has seen a chiropracter in the past for cervical and lumbar pain.  Pt has hx of low back pain and currently has plantarfascitis of L foot.  Pt has past medical hx of arthritis, chronic fatigue syndrome, GERD, headaches, hypothroidism, and thyroid disease.   Limitations Walking;Lifting;Standing;House hold activities;Other (comment)  increased symptoms with recreaational activities such as running and weight training   How long can you sit comfortably? 45 mins - 1 hour    How long can you stand comfortably? able to stand, but has increased pain    How long can you walk comfortably? able to walk with work activities, has increased pain    Diagnostic tests MRI   Patient Stated Goals "I want to get back to how I was"   Currently in Pain? Yes   Pain Score 4    Pain Location Neck   Pain Orientation --  Central   Pain Descriptors / Indicators Aching;Burning   Pain Type Chronic pain   Pain Onset More than a month ago   Pain Frequency Constant           Treatment  There Ex:  Seated cervical retractions 5s hold x 10; Seated GTB rows 2 x  10 with 3s hold, cues to relax and lower shoulders and retract cervical spine;  Manual: Manual cervical traction 30s hold x 5 bouts with 30s rest between; Seated MWM at C2 for R rotation with 2 x 10, notable gains in R cervical rotation AROM with decreased pain; STM to R cervical extensors, upper trap, and levator scapulae CPA with grade 2 mobilizations C2-T2, 20s/bout x 1 bout/level; R UPA grade 2 mobilizations C2-C7, 20s/bout x 1 bout/level; Cervical AROM rotation stretch to the right with SNAG using towel 5s hold, written handout provided for HEP; Postural education provided with conversation regarding modifications to seated study positions;                           PT Education - 02/03/17  0853    Education provided Yes   Education Details HEP reinforced with handout for SNAG techique to assist R rotation   Person(s) Educated Patient   Methods Explanation   Comprehension Verbalized understanding          PT Short Term Goals - 01/29/17 1202      PT SHORT TERM GOAL #1   Title Pt will demonstrate cervical ROM WFL's without pain or radiating symptoms down R UE.   Baseline Pt limited in all planes with increased symptoms in R UE with flexion, R rotation and R sidebending   Time 4   Period Weeks   Status New   Target Date 02/26/17     PT SHORT TERM GOAL #2   Title Pt will indicate pain of </= 5/10 with activity and cervical movement, in order to demonstrate improved function within pain free range.   Baseline Worst: 10/10, Best: 5/10   Time 4   Period Weeks   Status New   Target Date 02/26/17     PT SHORT TERM GOAL #3   Title Pt will demonstrate improved NDI score of <or= to 20% indicating min-mod disability in order to demonstrate self-reported improvement of pain and mobility.   Baseline 01/29/17: 38%   Time 4   Period Weeks   Status New   Target Date 02/26/17           PT Long Term Goals - 01/29/17 1206      PT LONG TERM GOAL #1   Title Pt will demonstrate compliance with HEP in order to manage pain and symptoms.   Time 8   Period Weeks   Status New   Target Date 03/26/17     PT LONG TERM GOAL #2   Title Pt will indicate pain levels of <or= 2/10 with activity in order to demonstrate improved functional mobility without pain.   Baseline Worst: 10/10, Best: 5/10   Time 8   Period Weeks   Status New   Target Date 03/26/17     PT LONG TERM GOAL #3   Title Pt will demonstrate improve strength of postural musculature in order to facilitate proper sitting and standing posture, allowing for improvment with mobility and daily tasks.   Baseline 01/29/17: L rhomboid and middle trap = 4/5, R rhombodi and middle trap = 4-/5, B lower trap = 3+/5   Time 8    Period Weeks   Status New   Target Date 03/26/17     PT LONG TERM GOAL #5   Title Pt will improve NDI score to <or= to 10% indicating minimal disability level in order to demonstrate self-reported improvement of pain and function.  Baseline 01/29/17: 38% indicating moderate disability   Time 8   Period Weeks   Status New   Target Date 03/26/17               Plan - 02/03/17 0854    Clinical Impression Statement Pt demonstrates considerable improvement in R cervical rotation AROM following SNAG technique and MWM at C2. She reports only a "mild twinge" in her neck at the end of her session compared to 4/10 neck pain upon arrival. Pt provided SNAG technique to add to her R rotation cervical AROM stretches. Continue additional HEP especially cervical retractions. Follow up as scheduled. Pt will benefit from continued therapy to help with her neck pain and improve her pain-free function with her studies and with leisure activities.    Rehab Potential Good   Clinical Impairments Affecting Rehab Potential High prior level of function, desire to return to running and weight training   PT Frequency 2x / week   PT Duration 8 weeks   PT Treatment/Interventions ADLs/Self Care Home Management;Aquatic Therapy;Cryotherapy;Electrical Stimulation;Moist Heat;Traction;Ultrasound;Functional mobility training;Therapeutic activities;Therapeutic exercise;Neuromuscular re-education;Patient/family education;Manual techniques;Passive range of motion;Dry needling;Taping   PT Next Visit Plan continue cervical manual technique and initiate cervical and upper quarter strengthening   PT Home Exercise Plan scapular retraction, cervical retraction cervical AROM - R rotation with SNAG      Patient will benefit from skilled therapeutic intervention in order to improve the following deficits and impairments:  Decreased activity tolerance, Decreased mobility, Decreased range of motion, Decreased strength, Hypomobility,  Increased muscle spasms, Postural dysfunction, Pain  Visit Diagnosis: Neck pain  Muscle weakness (generalized)     Problem List Patient Active Problem List   Diagnosis Date Noted  . Family history of colonic polyps   . Benign neoplasm of sigmoid colon   . Immunity status testing 08/01/2015  . Family history of colon cancer 07/31/2015  . Annual physical exam 07/31/2015  . Obesity 07/31/2015  . Foot pain 06/22/2015  . Loss of feeling or sensation 06/22/2015  . Backhand tennis elbow 07/06/2014  . Lateral epicondylitis of left elbow 07/06/2014  . 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[place]) 12/16/2013  . Arthralgia of multiple joints 12/16/2013  . Abnormal weight gain 12/16/2013  . 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 Adult hypothyroidism 09/07/2013   Phillips Grout PT, DPT   Taber Sweetser 02/03/2017, 9:52 AM  [place] MAIN [facility] SERVICES [address] [place], [place], [address] Phone: [phone]   Fax:  [phone]  Name: KIMANI BEDOYA MRN: [id number] Date of Birth: 1975/07/04

## 2017-02-05 ENCOUNTER — Ambulatory Visit: Payer: Federal, State, Local not specified - PPO | Admitting: Physical Therapy

## 2017-02-05 ENCOUNTER — Other Ambulatory Visit: Payer: Federal, State, Local not specified - PPO | Admitting: Orthotics

## 2017-02-10 ENCOUNTER — Encounter: Payer: Self-pay | Admitting: Physical Therapy

## 2017-02-10 ENCOUNTER — Ambulatory Visit: Payer: Federal, State, Local not specified - PPO | Admitting: Physical Therapy

## 2017-02-10 DIAGNOSIS — M542 Cervicalgia: Secondary | ICD-10-CM | POA: Diagnosis not present

## 2017-02-10 DIAGNOSIS — M6281 Muscle weakness (generalized): Secondary | ICD-10-CM

## 2017-02-10 NOTE — Therapy (Addendum)
[place] MAIN [facility] SERVICES [address] [place], [place], [address] Phone: [phone]   Fax:  [phone]  Physical Therapy Treatment  Patient Details  Name: Courtney Alvarez MRN: [id number] Date of Birth: 1975-06-21 Referring Provider: Meredith Pel  Encounter Date: 02/10/2017      PT End of Session - 02/10/17 0928    Visit Number 3   Number of Visits 17   Date for PT Re-Evaluation 03/26/17   PT Start Time 0845   PT Stop Time 0915   PT Time Calculation (min) 30 min   Activity Tolerance Patient tolerated treatment well;No increased pain   Behavior During Therapy WFL for tasks assessed/performed      Past Medical History:  Diagnosis Date  . Arthritis   . Chronic fatigue syndrome   . GERD (gastroesophageal reflux disease)   . Headache    tension/cluster  . Hypothyroidism   . Motion sickness   . Plantar fasciitis of left foot   . PONV (postoperative nausea and vomiting)   . Thyroid disease   . Vaginal Pap smear, abnormal     Past Surgical History:  Procedure Laterality Date  . BLADDER SURGERY    . CERVICAL BIOPSY  W/ LOOP ELECTRODE EXCISION    . COLONOSCOPY WITH PROPOFOL N/A 01/05/2016   Procedure: COLONOSCOPY WITH PROPOFOL;  Surgeon: Lucilla Lame, MD;  Location: [place];  Service: Endoscopy;  Laterality: N/A;  . POLYPECTOMY  01/05/2016   Procedure: POLYPECTOMY;  Surgeon: Lucilla Lame, MD;  Location: [place];  Service: Endoscopy;;    There were no vitals filed for this visit.      Subjective Assessment - 02/10/17 0849    Subjective (P)  Patient is having some stiffness and 3/10 pain to neck and tingling in right shoulder and scapula. She aggravated her neck by carrying the vacuum.    Pertinent History (P)  Pt has had symptoms in cervical spine for several years, with increase of symptoms beginning over the last 5 years.  Pt's MRI results revealed disc bulging of C4-5, C5-6 and C6-7.  Pt has hx of  headaches, with symptoms increasing as neck pain increases.  Pt has seen a chiropracter in the past for cervical and lumbar pain.  Pt has hx of low back pain and currently has plantarfascitis of L foot.  Pt has past medical hx of arthritis, chronic fatigue syndrome, GERD, headaches, hypothroidism, and thyroid disease.   Limitations (P)  Walking;Lifting;Standing;House hold activities;Other (comment)  increased symptoms with recreaational activities such as running and weight training   How long can you sit comfortably? (P)  45 mins - 1 hour    How long can you stand comfortably? (P)  able to stand, but has increased pain    How long can you walk comfortably? (P)  able to walk with work activities, has increased pain    Diagnostic tests (P)  MRI   Patient Stated Goals (P)  "I want to get back to how I was"   Currently in Pain? (P)  Yes   Pain Score (P)  3    Pain Location (P)  Neck   Pain Descriptors / Indicators (P)  Tingling;Aching   Pain Type (P)  Chronic pain   Pain Onset (P)  More than a month ago   Pain Frequency (P)  Constant   Aggravating Factors  (P)  standing and walking   Pain Relieving Factors (P)  rest ice, heat, NSAIDS acetomenophen  Effect of Pain on Daily Activities (P)  increased pain at night and it improves during the day   Multiple Pain Sites (P)  No      There Ex:  Seated cervical retractions 5s hold x 10; Seated GTB rows 2 x 10 with 3s hold, cues to relax and lower shoulders and retract cervical spine; Upper trap stretching 30 sec x 5 bilaterally  Manual: Manual cervical traction 30s hold x 5 bouts with 30s rest between; STM to R cervical extensors, upper trap, and levator scapulae CPA with grade 2 mobilizations C2-T2, 20s/bout x 1 bout/level;   Postural education provided with conversation regarding modifications to seated study positions;                           PT Education - 02/10/17 0921    Education provided Yes   Education  Details HEP technique   Person(s) Educated Patient   Methods Explanation   Comprehension Verbalized understanding          PT Short Term Goals - 01/29/17 1202      PT SHORT TERM GOAL #1   Title Pt will demonstrate cervical ROM WFL's without pain or radiating symptoms down R UE.   Baseline Pt limited in all planes with increased symptoms in R UE with flexion, R rotation and R sidebending   Time 4   Period Weeks   Status New   Target Date 02/26/17     PT SHORT TERM GOAL #2   Title Pt will indicate pain of </= 5/10 with activity and cervical movement, in order to demonstrate improved function within pain free range.   Baseline Worst: 10/10, Best: 5/10   Time 4   Period Weeks   Status New   Target Date 02/26/17     PT SHORT TERM GOAL #3   Title Pt will demonstrate improved NDI score of <or= to 20% indicating min-mod disability in order to demonstrate self-reported improvement of pain and mobility.   Baseline 01/29/17: 38%   Time 4   Period Weeks   Status New   Target Date 02/26/17           PT Long Term Goals - 01/29/17 1206      PT LONG TERM GOAL #1   Title Pt will demonstrate compliance with HEP in order to manage pain and symptoms.   Time 8   Period Weeks   Status New   Target Date 03/26/17     PT LONG TERM GOAL #2   Title Pt will indicate pain levels of <or= 2/10 with activity in order to demonstrate improved functional mobility without pain.   Baseline Worst: 10/10, Best: 5/10   Time 8   Period Weeks   Status New   Target Date 03/26/17     PT LONG TERM GOAL #3   Title Pt will demonstrate improve strength of postural musculature in order to facilitate proper sitting and standing posture, allowing for improvment with mobility and daily tasks.   Baseline 01/29/17: L rhomboid and middle trap = 4/5, R rhombodi and middle trap = 4-/5, B lower trap = 3+/5   Time 8   Period Weeks   Status New   Target Date 03/26/17     PT LONG TERM GOAL #5   Title Pt will  improve NDI score to <or= to 10% indicating minimal disability level in order to demonstrate self-reported improvement of pain and function.   Baseline  01/29/17: 38% indicating moderate disability   Time 8   Period Weeks   Status New   Target Date 03/26/17               Plan - 02/10/17 9390    Clinical Impression Statement Pt has continued to show improvements in cervical strength, mobility and decrease in pain.  Pt was able to progress positions for exercises today without an increase in pain or discomfort as compared to previous sessions.  New resistance based exercises were added today in order to further strengthen cervical  musculature in an effort to further progress overall functional mobility and stability with movement.  Pt would continue to benefit from skilled services in order to further strengthen scapula and cervical  musculature.   Rehab Potential Good   Clinical Impairments Affecting Rehab Potential High prior level of function, desire to return to running and weight training   PT Frequency 2x / week   PT Duration 8 weeks   PT Treatment/Interventions ADLs/Self Care Home Management;Aquatic Therapy;Cryotherapy;Electrical Stimulation;Moist Heat;Traction;Ultrasound;Functional mobility training;Therapeutic activities;Therapeutic exercise;Neuromuscular re-education;Patient/family education;Manual techniques;Passive range of motion;Dry needling;Taping   PT Next Visit Plan continue cervical manual technique and initiate cervical and upper quarter strengthening   PT Home Exercise Plan scapular retraction, cervical retraction cervical AROM - R rotation with SNAG      Patient will benefit from skilled therapeutic intervention in order to improve the following deficits and impairments:  Decreased activity tolerance, Decreased mobility, Decreased range of motion, Decreased strength, Hypomobility, Increased muscle spasms, Postural dysfunction, Pain  Visit Diagnosis: Neck  pain  Muscle weakness (generalized)     Problem List Patient Active Problem List   Diagnosis Date Noted  . Family history of colonic polyps   . Benign neoplasm of sigmoid colon   . Immunity status testing 08/01/2015  . Family history of colon cancer 07/31/2015  . Annual physical exam 07/31/2015  . Obesity 07/31/2015  . Foot pain 06/22/2015  . Loss of feeling or sensation 06/22/2015  . Backhand tennis elbow 07/06/2014  . Lateral epicondylitis of left elbow 07/06/2014  . 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[place]) 12/16/2013  . Arthralgia of multiple joints 12/16/2013  . Abnormal weight gain 12/16/2013  . 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 Adult hypothyroidism 09/07/2013    Alanson Puls, PT DPT 02/10/2017, 9:35 AM  [place] MAIN [facility] SERVICES [address] [place], [place], [address] Phone: [phone]   Fax:  [phone]  Name: Courtney Alvarez MRN: [id number] Date of Birth: 1976/02/12

## 2017-02-11 ENCOUNTER — Ambulatory Visit (INDEPENDENT_AMBULATORY_CARE_PROVIDER_SITE_OTHER): Payer: Federal, State, Local not specified - PPO | Admitting: Podiatry

## 2017-02-11 ENCOUNTER — Encounter: Payer: Self-pay | Admitting: Podiatry

## 2017-02-11 DIAGNOSIS — M722 Plantar fascial fibromatosis: Secondary | ICD-10-CM

## 2017-02-11 NOTE — Patient Instructions (Signed)
Pre-Operative Instructions  Congratulations, you have decided to take an important step towards improving your quality of life.  You can be assured that the doctors and staff at [facility] will be with you every step of the way.  Here are some important things you should know:  1. Plan to be at the surgery center/hospital at least 1 (one) hour prior to your scheduled time, unless otherwise directed by the surgical center/hospital staff.  You must have a responsible adult accompany you, remain during the surgery and drive you home.  Make sure you have directions to the surgical center/hospital to ensure you arrive on time. 2. If you are having surgery at [facility] or [facility] hospitals, you will need a copy of your medical history and physical form from your family physician within one month prior to the date of surgery. We will give you a form for your primary physician to complete.  3. We make every effort to accommodate the date you request for surgery.  However, there are times where surgery dates or times have to be moved.  We will contact you as soon as possible if a change in schedule is required.   4. No aspirin/ibuprofen for one week before surgery.  If you are on aspirin, any non-steroidal anti-inflammatory medications (Mobic, Aleve, Ibuprofen) should not be taken seven (7) days prior to your surgery.  You make take Tylenol for pain prior to surgery.  5. Medications - If you are taking daily heart and blood pressure medications, seizure, reflux, allergy, asthma, anxiety, pain or diabetes medications, make sure you notify the surgery center/hospital before the day of surgery so they can tell you which medications you should take or avoid the day of surgery. 6. No food or drink after midnight the night before surgery unless directed otherwise by surgical center/hospital staff. 7. No alcoholic beverages 24-hours prior to surgery.  No smoking 24-hours prior or 24-hours after  surgery. 8. Wear loose pants or shorts. They should be loose enough to fit over bandages, boots, and casts. 9. Don't wear slip-on shoes. Sneakers are preferred. 10. Bring your boot with you to the surgery center/hospital.  Also bring crutches or a walker if your physician has prescribed it for you.  If you do not have this equipment, it will be provided for you after surgery. 11. If you have not been contacted by the surgery center/hospital by the day before your surgery, call to confirm the date and time of your surgery. 12. Leave-time from work may vary depending on the type of surgery you have.  Appropriate arrangements should be made prior to surgery with your employer. 13. Prescriptions will be provided immediately following surgery by your doctor.  Fill these as soon as possible after surgery and take the medication as directed. Pain medications will not be refilled on weekends and must be approved by the doctor. 14. Remove nail polish on the operative foot and avoid getting pedicures prior to surgery. 15. Wash the night before surgery.  The night before surgery wash the foot and leg well with water and the antibacterial soap provided. Be sure to pay special attention to beneath the toenails and in between the toes.  Wash for at least three (3) minutes. Rinse thoroughly with water and dry well with a towel.  Perform this wash unless told not to do so by your physician.  Enclosed: 1 Ice pack (please put in freezer the night before surgery)   1 Hibiclens skin cleaner     Pre-op instructions  If you have any questions regarding the instructions, please do not hesitate to call our office.  [place]: [address], [place], [place] [address] -- [phone]  [place]: [address] [place], [place] [address] -- [phone]  [place]: [address]  [place], [place] [address] -- [phone]  [place]: [address], [address], [place],  [address] -- [phone]  Website:  [email or web address] 

## 2017-02-12 ENCOUNTER — Ambulatory Visit: Payer: Federal, State, Local not specified - PPO

## 2017-02-12 ENCOUNTER — Encounter (INDEPENDENT_AMBULATORY_CARE_PROVIDER_SITE_OTHER): Payer: Federal, State, Local not specified - PPO | Admitting: Physical Medicine and Rehabilitation

## 2017-02-12 ENCOUNTER — Telehealth: Payer: Self-pay | Admitting: *Deleted

## 2017-02-12 DIAGNOSIS — M6281 Muscle weakness (generalized): Secondary | ICD-10-CM

## 2017-02-12 DIAGNOSIS — M542 Cervicalgia: Secondary | ICD-10-CM | POA: Diagnosis not present

## 2017-02-12 NOTE — Telephone Encounter (Signed)
"  I'm a patient of Dr Amalia Hailey at the [place] office.  I'm calling to get on the books for surgery.  You can reach me at this number."

## 2017-02-12 NOTE — Therapy (Signed)
[place] MAIN [facility] SERVICES [address] [place], [place], [address] Phone: [phone]   Fax:  [phone]  Physical Therapy Treatment  Patient Details  Name: Courtney Alvarez MRN: [id number] Date of Birth: 10/21/1975 Referring Provider: Meredith Pel  Encounter Date: 02/12/2017      PT End of Session - 02/12/17 0851    Visit Number 4   Number of Visits 17   Date for PT Re-Evaluation 03/26/17   PT Start Time 0802   PT Stop Time 4270   PT Time Calculation (min) 45 min   Activity Tolerance Patient tolerated treatment well;No increased pain   Behavior During Therapy WFL for tasks assessed/performed      Past Medical History:  Diagnosis Date  . Arthritis   . Chronic fatigue syndrome   . GERD (gastroesophageal reflux disease)   . Headache    tension/cluster  . Hypothyroidism   . Motion sickness   . Plantar fasciitis of left foot   . PONV (postoperative nausea and vomiting)   . Thyroid disease   . Vaginal Pap smear, abnormal     Past Surgical History:  Procedure Laterality Date  . BLADDER SURGERY    . CERVICAL BIOPSY  W/ LOOP ELECTRODE EXCISION    . COLONOSCOPY WITH PROPOFOL N/A 01/05/2016   Procedure: COLONOSCOPY WITH PROPOFOL;  Surgeon: Lucilla Lame, MD;  Location: [place];  Service: Endoscopy;  Laterality: N/A;  . POLYPECTOMY  01/05/2016   Procedure: POLYPECTOMY;  Surgeon: Lucilla Lame, MD;  Location: [place];  Service: Endoscopy;;    There were no vitals filed for this visit.      Subjective Assessment - 02/12/17 0849    Subjective Patient feeling increased stiffness today due to having to take her child to the hospital yesterday for a hip fracture. She is doing better overall prior to the stress.    Pertinent History Pt has had symptoms in cervical spine for several years, with increase of symptoms beginning over the last 5 years.  Pt's MRI results revealed disc bulging of C4-5, C5-6 and C6-7.   Pt has hx of headaches, with symptoms increasing as neck pain increases.  Pt has seen a chiropracter in the past for cervical and lumbar pain.  Pt has hx of low back pain and currently has plantarfascitis of L foot.  Pt has past medical hx of arthritis, chronic fatigue syndrome, GERD, headaches, hypothroidism, and thyroid disease.   Limitations Walking;Lifting;Standing;House hold activities;Other (comment)  increased symptoms with recreaational activities such as running and weight training   How long can you sit comfortably? 45 mins - 1 hour    How long can you stand comfortably? able to stand, but has increased pain    How long can you walk comfortably? able to walk with work activities, has increased pain    Diagnostic tests MRI   Patient Stated Goals "I want to get back to how I was"   Currently in Pain? Yes   Pain Score 3    Pain Location Neck   Pain Orientation Medial;Lower;Upper   Pain Descriptors / Indicators Aching;Burning   Pain Type Chronic pain   Pain Onset More than a month ago   Pain Frequency Constant      Treatment   There Ex:  Seated cervical retractions 5s hold x 10; Seated OTB rows 15x with 3s hold, cues to relax and lower shoulders and retract cervical spine;  Cervical AROM rotation stretch to the right and  left with SNAG using towel 10s hold 3x each direction, sidebending AROM stretch L and R 10 second holds 3x each , extension AROM stretch 10 second holds 3x each   Manual: Manual cervical traction 30s hold x 5 bouts with 30s rest between; STM to R cervical extensors, upper trap, and levator scapulae CPA with grade II mobilizations C2-T5, 20s/bout x 1 bout/level; R UPA grade 2 mobilizations C2-T5 , 20s/bout x 1 bout/level; R and L side bend and rotation stretch with overpressure at shoulder 3x 60 seconds  Postural education provided with conversation regarding modifications to seated study positions;   Pt. response to medical necessity:  Patient will continue  to benefit from skilled physical therapy to decrease neck pain and improve function with studies and quality of life.                           PT Education - 02/12/17 0850    Education provided Yes   Education Details HEP technique   Person(s) Educated Patient   Methods Explanation;Demonstration;Verbal cues   Comprehension Verbalized understanding;Returned demonstration          PT Short Term Goals - 01/29/17 1202      PT SHORT TERM GOAL #1   Title Pt will demonstrate cervical ROM WFL's without pain or radiating symptoms down R UE.   Baseline Pt limited in all planes with increased symptoms in R UE with flexion, R rotation and R sidebending   Time 4   Period Weeks   Status New   Target Date 02/26/17     PT SHORT TERM GOAL #2   Title Pt will indicate pain of </= 5/10 with activity and cervical movement, in order to demonstrate improved function within pain free range.   Baseline Worst: 10/10, Best: 5/10   Time 4   Period Weeks   Status New   Target Date 02/26/17     PT SHORT TERM GOAL #3   Title Pt will demonstrate improved NDI score of <or= to 20% indicating min-mod disability in order to demonstrate self-reported improvement of pain and mobility.   Baseline 01/29/17: 38%   Time 4   Period Weeks   Status New   Target Date 02/26/17           PT Long Term Goals - 01/29/17 1206      PT LONG TERM GOAL #1   Title Pt will demonstrate compliance with HEP in order to manage pain and symptoms.   Time 8   Period Weeks   Status New   Target Date 03/26/17     PT LONG TERM GOAL #2   Title Pt will indicate pain levels of <or= 2/10 with activity in order to demonstrate improved functional mobility without pain.   Baseline Worst: 10/10, Best: 5/10   Time 8   Period Weeks   Status New   Target Date 03/26/17     PT LONG TERM GOAL #3   Title Pt will demonstrate improve strength of postural musculature in order to facilitate proper sitting and standing  posture, allowing for improvment with mobility and daily tasks.   Baseline 01/29/17: L rhomboid and middle trap = 4/5, R rhombodi and middle trap = 4-/5, B lower trap = 3+/5   Time 8   Period Weeks   Status New   Target Date 03/26/17     PT LONG TERM GOAL #5   Title Pt will improve NDI score to <  or= to 10% indicating minimal disability level in order to demonstrate self-reported improvement of pain and function.   Baseline 01/29/17: 38% indicating moderate disability   Time 8   Period Weeks   Status New   Target Date 03/26/17               Plan - 02/12/17 0854    Clinical Impression Statement Patient improving with mobility with minimal symptoms. Progression of exercises with AAROM with towel performed and patient demonstrated understanding of additional HEP.  R cervical musculature tighter than L and required prolonged stretching. Patient will continue to benefit from skilled physical therapy to decrease neck pain and improve function with studies and quality of life.   Rehab Potential Good   Clinical Impairments Affecting Rehab Potential High prior level of function, desire to return to running and weight training   PT Frequency 2x / week   PT Duration 8 weeks   PT Treatment/Interventions ADLs/Self Care Home Management;Aquatic Therapy;Cryotherapy;Electrical Stimulation;Moist Heat;Traction;Ultrasound;Functional mobility training;Therapeutic activities;Therapeutic exercise;Neuromuscular re-education;Patient/family education;Manual techniques;Passive range of motion;Dry needling;Taping   PT Next Visit Plan continue cervical manual technique and initiate cervical and upper quarter strengthening   PT Home Exercise Plan scapular retraction, cervical retraction cervical AROM - R rotation with SNAG      Patient will benefit from skilled therapeutic intervention in order to improve the following deficits and impairments:  Decreased activity tolerance, Decreased mobility, Decreased range  of motion, Decreased strength, Hypomobility, Increased muscle spasms, Postural dysfunction, Pain  Visit Diagnosis: Neck pain  Muscle weakness (generalized)     Problem List Patient Active Problem List   Diagnosis Date Noted  . Family history of colonic polyps   . Benign neoplasm of sigmoid colon   . Immunity status testing 08/01/2015  . Family history of colon cancer 07/31/2015  . Annual physical exam 07/31/2015  . Obesity 07/31/2015  . Foot pain 06/22/2015  . Loss of feeling or sensation 06/22/2015  . Backhand tennis elbow 07/06/2014  . Lateral epicondylitis of left elbow 07/06/2014  . 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[place]) 12/16/2013  . Arthralgia of multiple joints 12/16/2013  . Abnormal weight gain 12/16/2013  . 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 Adult hypothyroidism 09/07/2013   Janna Arch, PT, DPT   Janna Arch 02/12/2017, 8:56 AM  [place] MAIN [facility] SERVICES [address] [place], [place], [address] Phone: [phone]   Fax:  [phone]  Name: Courtney Alvarez MRN: [id number] Date of Birth: 02/16/1976

## 2017-02-13 NOTE — Telephone Encounter (Signed)
I'm returning your call.  You wanted to schedule your surgery.  Do you have a date in mind that you would like to schedule the surgery?  "Yes, I do.  I'd like to do it on December 13."  That date is available.  I will get it scheduled.  Someone from the surgical center will call you a day or two prior to the surgery date with the arrival time.  You can go online and register with the surgical center.  "How do I go about doing that?"  You should have received a brochure about the surgical center in your bag.  "They said they didn't have anymore and that they would mail me one."  Okay, we will send that via mail.    Brochure was put in the outgoing mail to be sent on tomorrow.

## 2017-02-13 NOTE — Progress Notes (Signed)
    Subjective: Patient presents today for follow-up evaluation and treatment of plantar fasciitis to the left foot. She reports improvement of the pain. She reports some continued pain to the heel and lateral side of the foot when running and standing. She has to do a PT test this weekend and has a form to be filled out. She has picked up her orthotics. She is here for further evaluation and treatment.    Past Medical History:  Diagnosis Date  . Arthritis   . Chronic fatigue syndrome   . GERD (gastroesophageal reflux disease)   . Headache    tension/cluster  . Hypothyroidism   . Motion sickness   . Plantar fasciitis of left foot   . PONV (postoperative nausea and vomiting)   . Thyroid disease   . Vaginal Pap smear, abnormal      Objective: Physical Exam General: The patient is alert and oriented x3 in no acute distress.  Dermatology: Skin is warm, dry and supple bilateral lower extremities. Negative for open lesions or macerations bilateral.   Vascular: Dorsalis Pedis and Posterior Tibial pulses palpable bilateral.  Capillary fill time is immediate to all digits.  Neurological: Epicritic and protective threshold intact bilateral.   Musculoskeletal: Pain to palpation at the medial calcaneal tubercale and through the insertion extending distally to the medial longitudinal arch of the plantar fascia of the left foot. Significant pain on palpation noted.  All other joints range of motion within normal limits bilateral. Strength 5/5 in all groups bilateral.   Assessment: #1 plantar fasciitis left foot   Plan of Care:  1. Today the patient was evaluated. 2. Today we discussed the conservative versus surgical management of the presenting pathology. The patient opts for surgical management. All possible complications and details of the procedure were explained. All patient questions were answered. No guarantees were expressed or implied. 3. Authorization for surgery was initiated  today. Surgery will consist of endoscopic plantar fasciotomy left. 4. Return to clinic one week post op.   Edrick Kins, DPM [facility]  Dr. Edrick Kins, DPM    [address], [place] [address]                Office [phone]  Fax [phone]

## 2017-02-17 ENCOUNTER — Encounter: Payer: Self-pay | Admitting: Physical Therapy

## 2017-02-17 ENCOUNTER — Ambulatory Visit: Payer: Federal, State, Local not specified - PPO | Attending: Orthopedic Surgery | Admitting: Physical Therapy

## 2017-02-17 DIAGNOSIS — M542 Cervicalgia: Secondary | ICD-10-CM | POA: Diagnosis not present

## 2017-02-17 DIAGNOSIS — M6281 Muscle weakness (generalized): Secondary | ICD-10-CM | POA: Diagnosis not present

## 2017-02-17 NOTE — Therapy (Signed)
[place] MAIN [facility] SERVICES [address] [place], [place], [address] Phone: [phone]   Fax:  [phone]  Physical Therapy Treatment  Patient Details  Name: Courtney Alvarez MRN: [id number] Date of Birth: Sep 17, 1975 Referring Provider: Meredith Pel   Encounter Date: 02/17/2017  PT End of Session - 02/17/17 0816    Visit Number  5    Number of Visits  17    Date for PT Re-Evaluation  03/26/17    PT Start Time  0815    PT Stop Time  0900    PT Time Calculation (min)  45 min    Activity Tolerance  Patient tolerated treatment well;No increased pain    Behavior During Therapy  WFL for tasks assessed/performed       Past Medical History:  Diagnosis Date  . Arthritis   . Chronic fatigue syndrome   . GERD (gastroesophageal reflux disease)   . Headache    tension/cluster  . Hypothyroidism   . Motion sickness   . Plantar fasciitis of left foot   . PONV (postoperative nausea and vomiting)   . Thyroid disease   . Vaginal Pap smear, abnormal     Past Surgical History:  Procedure Laterality Date  . BLADDER SURGERY    . CERVICAL BIOPSY  W/ LOOP ELECTRODE EXCISION      There were no vitals filed for this visit.  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     Pertinent History  Pt has had symptoms in cervical spine for several years, with increase of symptoms beginning over the last 5 years.  Pt's MRI results revealed disc bulging of C4-5, C5-6 and C6-7.  Pt has hx of headaches, with symptoms increasing as neck pain increases.  Pt has seen a chiropracter in the past for cervical and lumbar pain.  Pt has hx of low back pain and currently has plantarfascitis of L foot.  Pt has past medical hx of arthritis, chronic fatigue syndrome, GERD, headaches, hypothroidism, and thyroid disease.    Limitations  Walking;Lifting;Standing;House hold activities;Other (comment) increased symptoms with recreaational activities such as running and weight training   increased symptoms with recreaational activities such as running and weight training   How long can you sit comfortably?  45 mins - 1 hour     How long can you stand comfortably?  able to stand, but has increased pain     How long can you walk comfortably?  able to walk with work activities, has increased pain     Diagnostic tests  MRI    Patient Stated Goals  "I want to get back to how I was"    Currently in Pain?  Yes    Pain Score  5     Pain Location  Neck    Pain Onset  More than a month ago    Multiple Pain Sites  No       There Ex:  Seated cervical retractions 5s hold x 10; Seated GTB rows 2 x 10 with 3s hold, cues to relax and lower shoulders and retract cervical spine; Upper trap stretching 30 sec x 5 bilaterally  Manual: Manual cervical traction 30s hold x 5 bouts with 30s rest between; STM to R cervical extensors, upper trap, and levator  scapulae CPA with grade 2 mobilizationsC2-T2, 20s/bout x 1 bout/level;   Postural education provided with conversation regarding modifications to seated study positions;                       PT Education - 02/17/17 0815    Education provided  Yes    Education Details  HEP importance    Person(s) Educated  Patient    Methods  Explanation;Demonstration;Verbal cues    Comprehension  Verbalized understanding;Returned demonstration;Verbal cues required       PT Short Term Goals - 01/29/17 1202      PT SHORT TERM GOAL #1   Title  Pt will demonstrate cervical ROM WFL's without pain or radiating symptoms down R UE.    Baseline  Pt limited in all planes with increased symptoms in R UE with flexion, R rotation and R sidebending    Time  4    Period  Weeks    Status  New    Target Date  02/26/17      PT SHORT TERM GOAL #2   Title  Pt will indicate  pain of </= 5/10 with activity and cervical movement, in order to demonstrate improved function within pain free range.    Baseline  Worst: 10/10, Best: 5/10    Time  4    Period  Weeks    Status  New    Target Date  02/26/17      PT SHORT TERM GOAL #3   Title  Pt will demonstrate improved NDI score of <or= to 20% indicating min-mod disability in order to demonstrate self-reported improvement of pain and mobility.    Baseline  01/29/17: 38%    Time  4    Period  Weeks    Status  New    Target Date  02/26/17        PT Long Term Goals - 01/29/17 1206      PT LONG TERM GOAL #1   Title  Pt will demonstrate compliance with HEP in order to manage pain and symptoms.    Time  8    Period  Weeks    Status  New    Target Date  03/26/17      PT LONG TERM GOAL #2   Title  Pt will indicate pain levels of <or= 2/10 with activity in order to demonstrate improved functional mobility without pain.    Baseline  Worst: 10/10, Best: 5/10    Time  8    Period  Weeks    Status  New    Target Date  03/26/17      PT LONG TERM GOAL #3   Title  Pt will demonstrate improve strength of postural musculature in order to facilitate proper sitting and standing posture, allowing for improvment with mobility and daily tasks.    Baseline  01/29/17: L rhomboid and middle trap = 4/5, R rhombodi and middle trap = 4-/5, B lower trap = 3+/5    Time  8    Period  Weeks    Status  New    Target Date  03/26/17      PT LONG TERM GOAL #5   Title  Pt will improve NDI score to <or= to 10% indicating minimal disability level in order to demonstrate self-reported improvement of pain and function.    Baseline  01/29/17: 38% indicating moderate disability    Time  8    Period  Weeks  Status  New    Target Date  03/26/17            Plan - 02/17/17 0816    Clinical Impression Statement  Patient presents with cold symptoms today and 5/10 neck pain. Following manual therapy she reports no pain  in her neck at  the end of her session compared to 5/10 neck pain upon arrival.. Continue additional HEP especially cervical retractions. Follow up as scheduled. Pt will benefit from continued therapy to help with her neck pain and improve her pain-free function with her studies and with leisure activities.    Rehab Potential  Good    Clinical Impairments Affecting Rehab Potential  High prior level of function, desire to return to running and weight training    PT Frequency  2x / week    PT Duration  8 weeks    PT Treatment/Interventions  ADLs/Self Care Home Management;Aquatic Therapy;Cryotherapy;Electrical Stimulation;Moist Heat;Traction;Ultrasound;Functional mobility training;Therapeutic activities;Therapeutic exercise;Neuromuscular re-education;Patient/family education;Manual techniques;Passive range of motion;Dry needling;Taping    PT Next Visit Plan  continue cervical manual technique and initiate cervical and upper quarter strengthening    PT Home Exercise Plan  scapular retraction, cervical retraction cervical AROM - R rotation with SNAG       Patient will benefit from skilled therapeutic intervention in order to improve the following deficits and impairments:  Decreased activity tolerance, Decreased mobility, Decreased range of motion, Decreased strength, Hypomobility, Increased muscle spasms, Postural dysfunction, Pain  Visit Diagnosis: No diagnosis found.     Problem List Patient Active Problem List   Diagnosis Date Noted  . Family history of colonic polyps   . Benign neoplasm of sigmoid colon   . Immunity status testing 08/01/2015  . Family history of colon cancer 07/31/2015  . Annual physical exam 07/31/2015  . Obesity 07/31/2015  . Foot pain 06/22/2015  . Loss of feeling or sensation 06/22/2015  . Backhand tennis elbow 07/06/2014  . Lateral epicondylitis of left elbow 07/06/2014  . 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[place]) 12/16/2013  . Arthralgia of multiple joints  12/16/2013  . Abnormal weight gain 12/16/2013  . 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 Adult hypothyroidism 09/07/2013    Alanson Puls, PT DPT 02/17/2017, 9:32 AM  [place] MAIN [facility] SERVICES [address] [place], [place], [address] Phone: [phone]   Fax:  [phone]  Name: BRYNNA DOBOS MRN: [id number] Date of Birth: 04-03-1976

## 2017-02-19 ENCOUNTER — Encounter (INDEPENDENT_AMBULATORY_CARE_PROVIDER_SITE_OTHER): Payer: Self-pay | Admitting: Physical Medicine and Rehabilitation

## 2017-02-19 ENCOUNTER — Ambulatory Visit: Payer: Federal, State, Local not specified - PPO | Admitting: Physical Therapy

## 2017-02-19 ENCOUNTER — Ambulatory Visit (INDEPENDENT_AMBULATORY_CARE_PROVIDER_SITE_OTHER): Payer: Federal, State, Local not specified - PPO

## 2017-02-19 ENCOUNTER — Ambulatory Visit (INDEPENDENT_AMBULATORY_CARE_PROVIDER_SITE_OTHER): Payer: Federal, State, Local not specified - PPO | Admitting: Physical Medicine and Rehabilitation

## 2017-02-19 VITALS — BP 143/78 | HR 71 | Temp 98.2°F

## 2017-02-19 DIAGNOSIS — M5412 Radiculopathy, cervical region: Secondary | ICD-10-CM | POA: Diagnosis not present

## 2017-02-19 MED ORDER — BETAMETHASONE SOD PHOS & ACET 6 (3-3) MG/ML IJ SUSP
12.0000 mg | Freq: Once | INTRAMUSCULAR | Status: AC
  Administered 2017-02-19: 12 mg

## 2017-02-19 MED ORDER — LIDOCAINE HCL (PF) 1 % IJ SOLN
2.0000 mL | Freq: Once | INTRAMUSCULAR | Status: AC
  Administered 2017-02-19: 2 mL

## 2017-02-19 NOTE — Patient Instructions (Signed)

## 2017-02-20 ENCOUNTER — Encounter (INDEPENDENT_AMBULATORY_CARE_PROVIDER_SITE_OTHER): Payer: Self-pay | Admitting: Physical Medicine and Rehabilitation

## 2017-02-20 NOTE — Procedures (Signed)
Courtney Alvarez is a 41 year old right-hand-dominant chronic worsening right more than left shoulder and arm pain with numbness and tingling somewhat in a C6 distribution versus possible carpal tunnel syndrome.  He does have foraminal narrowing at C5-6.  Please see our prior evaluation and management note for further details and justification.  Cervical Epidural Steroid Injection - Interlaminar Approach with Fluoroscopic Guidance  Patient: Courtney Alvarez      Date of Birth: June 04, 1975 MRN: [id number] PCP: Arnetha Courser, MD      Visit Date: 02/19/2017   Universal Protocol:    Date/Time: 11/08/185:27 AM  Consent Given By: the patient  Position: PRONE  Additional Comments: Vital signs were monitored before and after the procedure. Patient was prepped and draped in the usual sterile fashion. The correct patient, procedure, and site was verified.   Injection Procedure Details:  Procedure Site One Meds Administered:  Meds ordered this encounter  Medications  . lidocaine (PF) (XYLOCAINE) 1 % injection 2 mL  . betamethasone acetate-betamethasone sodium phosphate (CELESTONE) injection 12 mg     Laterality: Right  Location/Site:  C7-T1  Needle size: 22 G  Needle type: Touhy  Needle Placement: Paramedian epidural space  Findings:  -Contrast Used: 0.5 mL iohexol 180 mg iodine/mL   -Comments: Excellent flow of contrast into the epidural space.  Procedure Details: Using a paramedian approach from the side mentioned above, the region overlying the inferior lamina was localized under fluoroscopic visualization and the soft tissues overlying this structure were infiltrated with 4 ml. of 1% Lidocaine without Epinephrine. A # 20 gauge, Tuohy needle was inserted into the epidural space using a paramedian approach.  The epidural space was localized using loss of resistance along with lateral and contralateral oblique bi-planar fluoroscopic views.  After negative aspirate for air, blood, and  CSF, a 2 ml. volume of Isovue-250 was injected into the epidural space and the flow of contrast was observed. Radiographs were obtained for documentation purposes.   The injectate was administered into the level noted above.  Additional Comments:  The patient tolerated the procedure well Dressing: Band-Aid    Post-procedure details: Patient was observed during the procedure. Post-procedure instructions were reviewed.  Patient left the clinic in stable condition.

## 2017-02-20 NOTE — Progress Notes (Signed)
Courtney Alvarez - 41 y.o. female MRN [id number]  Date of birth: 1975/07/12  Office Visit Note: Visit Date: 01/29/2017 PCP: Courtney Courser, MD Referred by: Courtney Courser, MD  Subjective: Chief Complaint  Patient presents with  . Neck - Pain   HPI: Courtney Alvarez is a 41 year old right-hand-dominant female who comes in today at the request of Courtney Alvarez for evaluation and treatment of her neck pain.  He began seeing her in August for chronic worsening neck pain which is referring to both arms and shoulders.  She was having more right than left symptoms.  She reported some vague nondermatomal numbness and tingling in the hands.  Numbness and tingling may be more of the more radial digits consistent with may be a carpal tunnel syndrome or C6 radiculitis.  He is treated her conservatively with physical therapy and she has had chiropractic care.  She has had muscle relaxers as well as the use of tramadol without much relief.  She has not noted specific trauma or injury.  She has had this ongoing now for several months.  It does give her some difficulty trying to sleep.  She is awakened at times with pain.  She does have associated headaches which are occipital and frontal.  She has a history of chronic headache.  She denies any focal weakness but feels weak in general.  She has a history of chronic fatigue syndrome.  No documented history of fibromyalgia.  She reports no fevers chills or night sweats or unintended weight loss.  MRI of the cervical spine was performed and this is reviewed below.  We did review this with the patient today.  She does have some by foraminal stenosis at C5-6 and mild central stenosis.  No focal compression.  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  Her symptoms seem to be worse with activity and position.  Her symptoms are worse first thing in the morning.   Secondarily, she does have a history of chronic  recalcitrant plantar fasciitis.  She reports multiple injections and treatment without any relief of her plantar fasciitis.    Review of Systems  Constitutional: Positive for malaise/fatigue. Negative for chills, fever and weight loss.  HENT: Negative for hearing loss and sinus pain.   Eyes: Negative for blurred vision, double vision and photophobia.  Respiratory: Negative for cough and shortness of breath.   Cardiovascular: Negative for chest pain, palpitations and leg swelling.  Gastrointestinal: Negative for abdominal pain, nausea and vomiting.  Genitourinary: Negative for flank pain.  Musculoskeletal: Positive for neck pain. Negative for myalgias.  Skin: Negative for itching and rash.  Neurological: Positive for tingling and headaches. Negative for tremors, focal weakness and weakness.  Endo/Heme/Allergies: Negative.   Psychiatric/Behavioral: Negative for depression.  All other systems reviewed and are negative.  Otherwise per HPI.  Assessment & Plan: Visit Diagnoses:  1. Cervical radiculopathy   2. Cervical disc disorder with radiculopathy     Plan: Findings:  Chronic worsening severe at times mostly axial neck pain referred into both shoulders right more than left with some nondermatomal numbness and tingling in the hands.  She has had cervical MRI showing by foraminal narrowing at C5-6 and mild stenosis.  Numbness can be in the more radial 3 digits.  No focal nerve compression and physical exam is fairly benign.  She does have much trigger points in the levator scapula and rhomboid  area that could reproduce some of the issues.  She has had therapy as well as chiropractic care.  She really has failed conservative care otherwise and the symptoms are pretty significant for her.  Her case is complicated by history of chronic fatigue syndrome but no documented history of fibromyalgia.  She currently has some myofascial pain as well.  I do think the headaches are probably myofascial in  origin.  I do think it would be warranted to complete a cervical epidural injection with fluoroscopic guidance.  This would be diagnostic and hopefully therapeutic.  If it did not seem to help very much think she should regroup with a good physical therapist for dry needling and myofascial pain treatment.  If she does get relief then we would see how long that would go and intermittently injection could be performed if it was very beneficial.  Alternatively electrodiagnostic study could be looked at for her tingling in the hands.  I think this is all part and parcel of what is going on and not carpal tunnel syndrome but we cannot rule that out.  We will see the patient for the injection she will continue to follow-up with Courtney Alvarez for orthopedic care.  Would not change any medications at this point.    Meds & Orders: No orders of the defined types were placed in this encounter.  No orders of the defined types were placed in this encounter.   Follow-up: Return for C7-T1 interlaminar epidural steroid injection..   Procedures: No procedures performed  No notes on file   Clinical History: MRI CERVICAL SPINE WITHOUT CONTRAST  TECHNIQUE: Multiplanar, multisequence MR imaging of the cervical spine was performed. No intravenous contrast was administered.  COMPARISON:  Cervical spine x-rays dated December 11, 2016.  FINDINGS: Alignment: Trace stepwise retrolisthesis of C4 on C5 and C5 on C6.  Vertebrae: No fracture, evidence of discitis, or bone lesion.  Cord: Normal signal and morphology.  Posterior Fossa, vertebral arteries, paraspinal tissues: Negative.  Disc levels:  C2-C3: Mild left facet uncovertebral hypertrophy resulting in mild left neuroforaminal stenosis. No significant central spinal canal or right neuroforaminal stenosis.  C3-C4: Mild bilateral facet uncovertebral hypertrophy resulting in mild bilateral neuroforaminal stenosis. No significant central spinal canal  stenosis.  C4-C5: Posterior disc osteophyte complex, slightly asymmetric to the right, and bilateral facet uncovertebral hypertrophy resulting in moderate bilateral neuroforaminal stenosis. No significant central spinal canal stenosis.  C5-C6: Small posterior disc osteophyte complex and mild bilateral facet uncovertebral hypertrophy resulting in mild central spinal canal stenosis and moderate bilateral neuroforaminal stenosis.  C6-C7: Small posterior disc osteophyte complex, slightly asymmetric to the right, without significant spinal canal or neuroforaminal stenosis.  C7-T1:  Negative.  No significant central spinal canal or neuroforaminal stenosis in the visualized upper thoracic spine.  IMPRESSION: 1. Multilevel degenerative changes of the cervical spine, worst at C4-C5 and C5-C6 where there is moderate bilateral neuroforaminal stenosis. Mild central spinal canal stenosis at C5-C6.   Electronically Signed   By: Titus Dubin M.D.   On: 12/26/2016 16:21  She reports that she quit smoking about 13 years ago. she has never used smokeless tobacco.  Recent Labs    08/28/16 1147  HGBA1C 5.2    Objective:  VS:  HT:    WT:   BMI:     BP:124/70  HR:67bpm  TEMP: ( )  RESP:  Physical Exam  Constitutional: She is oriented to person, place, and time. She appears well-developed and well-nourished. No distress.  HENT:  Head: Normocephalic and atraumatic.  Nose: Nose normal.  Mouth/Throat: Oropharynx is clear and moist.  Eyes: Conjunctivae are normal. Pupils are equal, round, and reactive to light.  Neck: Neck supple. No tracheal deviation present.  Cardiovascular: Regular rhythm and intact distal pulses.  Pulmonary/Chest: Effort normal. No respiratory distress.  Abdominal: She exhibits no distension. There is no guarding.  Musculoskeletal:  Examination of the cervical spine shows normal anatomic alignment.  She does have pain at end ranges of flexion and  extension.  She has good rotation.  He has positive trigger points in the paraspinal musculature trapezius levator scapula.  She has no shoulder impingement signs.  She has good upper body strength bilaterally in all muscle groups including wrist extension long finger flexion and abduction.  She has a negative Hoffman's test bilaterally.  She has a negative Tinel's test at the wrist bilaterally.  She has intact sensation to light touch.  Lymphadenopathy:    She has no cervical adenopathy.  Neurological: She is alert and oriented to person, place, and time. She exhibits normal muscle tone. Coordination normal.  Skin: Skin is warm. No rash noted. No erythema.  Psychiatric: She has a normal mood and affect. Her behavior is normal.  Nursing note and vitals reviewed.   Ortho Exam Imaging: Xr C-arm No Report  Result Date: 02/19/2017 Please see Notes or Procedures tab for imaging impression.   Past Medical/Family/Surgical/Social History: Medications & Allergies reviewed per EMR Patient Active Problem List   Diagnosis Date Noted  . Family history of colonic polyps   . Benign neoplasm of sigmoid colon   . Immunity status testing 08/01/2015  . Family history of colon cancer 07/31/2015  . Annual physical exam 07/31/2015  . Obesity 07/31/2015  . Foot pain 06/22/2015  . Loss of feeling or sensation 06/22/2015  . Backhand tennis elbow 07/06/2014  . Lateral epicondylitis of left elbow 07/06/2014  . 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[place]) 12/16/2013  . Arthralgia of multiple joints 12/16/2013  . Abnormal weight gain 12/16/2013  . 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 Adult hypothyroidism 09/07/2013   Past Medical History:  Diagnosis Date  . Arthritis   . Chronic fatigue syndrome   . GERD (gastroesophageal reflux disease)   . Headache    tension/cluster  . Hypothyroidism   . Motion sickness   . Plantar fasciitis of left foot   . PONV (postoperative nausea and  vomiting)   . Thyroid disease   . Vaginal Pap smear, abnormal    Family History  Problem Relation Age of Onset  . Arthritis Mother   . COPD Mother   . Thyroid disease Mother   . Arthritis Father   . Hyperlipidemia Father   . Cancer Father        polyp removed  . Hashimoto's thyroiditis Sister   . Arthritis Maternal Grandmother   . Cancer Maternal Grandmother        colon  . Heart disease Maternal Grandmother   . Thyroid disease Maternal Grandmother   . Alcohol abuse Maternal Grandfather   . Arthritis Maternal Grandfather   . Arthritis Paternal Grandmother   . Depression Paternal Grandmother   . Hypertension Paternal Grandmother   . Stroke Paternal Grandmother   . Arthritis Paternal Grandfather   . Heart disease Paternal Grandfather   . Hypertension Paternal Grandfather   . Breast cancer Neg Hx    Past Surgical History:  Procedure Laterality Date  . BLADDER SURGERY    . CERVICAL BIOPSY  W/ LOOP ELECTRODE EXCISION     Social History   Occupational History  . Not on file  Tobacco Use  . Smoking status: Former Smoker    Last attempt to quit: 06/14/2003    Years since quitting: 13.6  . Smokeless tobacco: Never Used  . Tobacco comment: quit 06/2003  Substance and Sexual Activity  . Alcohol use: Yes    Alcohol/week: 1.2 oz    Types: 2 Glasses of wine per week    Comment: occasionally   . Drug use: No  . Sexual activity: Yes    Partners: Male    Birth control/protection: IUD    Comment: mirena

## 2017-02-21 ENCOUNTER — Telehealth: Payer: Self-pay | Admitting: Podiatry

## 2017-02-21 NOTE — Telephone Encounter (Signed)
I saw Dr. Amalia Hailey a couple of weeks ago for a follow up. I supplied paperwork for my [employer] job and they are also requesting additional office notes and any other notes that were written that day. Just let me know if you guys want to mail them to me or if you want me to come pick them up. Please call me back at [phone]. Thank you.

## 2017-02-24 ENCOUNTER — Ambulatory Visit: Payer: Federal, State, Local not specified - PPO | Admitting: Physical Therapy

## 2017-02-26 ENCOUNTER — Ambulatory Visit: Payer: Federal, State, Local not specified - PPO | Admitting: Physical Therapy

## 2017-02-26 ENCOUNTER — Encounter: Payer: Self-pay | Admitting: Physical Therapy

## 2017-02-26 DIAGNOSIS — M6281 Muscle weakness (generalized): Secondary | ICD-10-CM | POA: Diagnosis not present

## 2017-02-26 DIAGNOSIS — M542 Cervicalgia: Secondary | ICD-10-CM

## 2017-02-26 NOTE — Therapy (Signed)
[place] MAIN [facility] SERVICES [address] [place], [place], [address] Phone: [phone]   Fax:  [phone]  Physical Therapy Treatment  Patient Details  Name: Courtney Alvarez MRN: [id number] Date of Birth: [birth year] Referring Provider: Meredith Pel   Encounter Date: 02/26/2017  PT End of Session - 02/26/17 0938    Visit Number  6    Number of Visits  17    Date for PT Re-Evaluation  03/26/17    PT Start Time  0845    PT Stop Time  0930    PT Time Calculation (min)  45 min    Activity Tolerance  Patient tolerated treatment well;No increased pain    Behavior During Therapy  WFL for tasks assessed/performed       Past Medical History:  Diagnosis Date  . Arthritis   . Chronic fatigue syndrome   . GERD (gastroesophageal reflux disease)   . Headache    tension/cluster  . Hypothyroidism   . Motion sickness   . Plantar fasciitis of left foot   . PONV (postoperative nausea and vomiting)   . Thyroid disease   . Vaginal Pap smear, abnormal     Past Surgical History:  Procedure Laterality Date  . BLADDER SURGERY    . CERVICAL BIOPSY  W/ LOOP ELECTRODE EXCISION      There were no vitals filed for this visit.  Subjective Assessment - 02/26/17 0849    Subjective  Patient is having 6/10 neck pain and her right arm is doing better. She was having symptoms of numbness and tingling all the way to her fingers and now she is not having the numbness all the way to her hand.     Pertinent History  Pt has had symptoms in cervical spine for several years, with increase of symptoms beginning over the last 5 years.  Pt's MRI results revealed disc bulging of C4-5, C5-6 and C6-7.  Pt has hx of headaches, with symptoms increasing as neck pain increases.  Pt has seen a chiropracter in the past for cervical and lumbar pain.  Pt has hx of low back pain and currently has plantarfascitis of L foot.  Pt has past medical hx of arthritis, chronic fatigue  syndrome, GERD, headaches, hypothroidism, and thyroid disease.    Limitations  Walking;Lifting;Standing;House hold activities;Other (comment) increased symptoms with recreaational activities such as running and weight training    How long can you sit comfortably?  45 mins - 1 hour     How long can you stand comfortably?  able to stand, but has increased pain     How long can you walk comfortably?  able to walk with work activities, has increased pain     Diagnostic tests  MRI    Patient Stated Goals  "I want to get back to how I was"    Pain Score  6     Pain Location  Neck    Pain Descriptors / Indicators  Aching    Pain Radiating Towards  upper traps    Pain Onset  More than a month ago    Pain Frequency  Constant    Aggravating Factors   movement, studying, stress    Pain Relieving Factors  rest ice and meds    Effect of Pain on Daily Activities  need to rest more    Multiple Pain Sites  -- back pain 7/10        Manual: Manual cervical traction 30s  hold x 5 bouts with 30s rest between; STM to R cervical extensors, upper trap, and levator scapulae CPA with grade 2 mobilizationsC2-T2, 20s/bout x 1 bout/level; OA release x 5 mins  Stretches to upper trap x 30 sec x 10 BUE    Postural education provided with conversation regarding modifications to seated study positions; Patient reports 0/10 pain following treatment                       PT Education - 02/26/17 0937    Education provided  Yes    Education Details  stretching for HEP    Person(s) Educated  Patient    Methods  Explanation;Demonstration;Verbal cues    Comprehension  Verbalized understanding;Returned demonstration       PT Short Term Goals - 01/29/17 1202      PT SHORT TERM GOAL #1   Title  Pt will demonstrate cervical ROM WFL's without pain or radiating symptoms down R UE.    Baseline  Pt limited in all planes with increased symptoms in R UE with flexion, R rotation and R sidebending     Time  4    Period  Weeks    Status  New    Target Date  02/26/17      PT SHORT TERM GOAL #2   Title  Pt will indicate pain of </= 5/10 with activity and cervical movement, in order to demonstrate improved function within pain free range.    Baseline  Worst: 10/10, Best: 5/10    Time  4    Period  Weeks    Status  New    Target Date  02/26/17      PT SHORT TERM GOAL #3   Title  Pt will demonstrate improved NDI score of <or= to 20% indicating min-mod disability in order to demonstrate self-reported improvement of pain and mobility.    Baseline  01/29/17: 38%    Time  4    Period  Weeks    Status  New    Target Date  02/26/17        PT Long Term Goals - 01/29/17 1206      PT LONG TERM GOAL #1   Title  Pt will demonstrate compliance with HEP in order to manage pain and symptoms.    Time  8    Period  Weeks    Status  New    Target Date  03/26/17      PT LONG TERM GOAL #2   Title  Pt will indicate pain levels of <or= 2/10 with activity in order to demonstrate improved functional mobility without pain.    Baseline  Worst: 10/10, Best: 5/10    Time  8    Period  Weeks    Status  New    Target Date  03/26/17      PT LONG TERM GOAL #3   Title  Pt will demonstrate improve strength of postural musculature in order to facilitate proper sitting and standing posture, allowing for improvment with mobility and daily tasks.    Baseline  01/29/17: L rhomboid and middle trap = 4/5, R rhombodi and middle trap = 4-/5, B lower trap = 3+/5    Time  8    Period  Weeks    Status  New    Target Date  03/26/17      PT LONG TERM GOAL #5   Title  Pt will improve NDI score to <or=  to 10% indicating minimal disability level in order to demonstrate self-reported improvement of pain and function.    Baseline  01/29/17: 38% indicating moderate disability    Time  8    Period  Weeks    Status  New    Target Date  03/26/17            Plan - 02/26/17 3428    Clinical Impression  Statement  Patient continues to have moderate to severe pain in neck and upper traps. She responded to manual therpay , STM and stretching to upper trap muscles. She was able to decrease her pain level following manual intervientions and STM. Patient will benefit form continued skilled PT to improve mobiity and decrease pain.      Rehab Potential  Good    Clinical Impairments Affecting Rehab Potential  High prior level of function, desire to return to running and weight training    PT Frequency  2x / week    PT Duration  8 weeks    PT Treatment/Interventions  ADLs/Self Care Home Management;Aquatic Therapy;Cryotherapy;Electrical Stimulation;Moist Heat;Traction;Ultrasound;Functional mobility training;Therapeutic activities;Therapeutic exercise;Neuromuscular re-education;Patient/family education;Manual techniques;Passive range of motion;Dry needling;Taping    PT Next Visit Plan  continue cervical manual technique and initiate cervical and upper quarter strengthening    PT Home Exercise Plan  scapular retraction, cervical retraction cervical AROM - R rotation with SNAG    Consulted and Agree with Plan of Care  Patient       Patient will benefit from skilled therapeutic intervention in order to improve the following deficits and impairments:  Decreased activity tolerance, Decreased mobility, Decreased range of motion, Decreased strength, Hypomobility, Increased muscle spasms, Postural dysfunction, Pain  Visit Diagnosis: Neck pain  Muscle weakness (generalized)     Problem List Patient Active Problem List   Diagnosis Date Noted  . Family history of colonic polyps   . Benign neoplasm of sigmoid colon   . Immunity status testing 08/01/2015  . Family history of colon cancer 07/31/2015  . Annual physical exam 07/31/2015  . Obesity 07/31/2015  . Foot pain 06/22/2015  . Loss of feeling or sensation 06/22/2015  . Backhand tennis elbow 07/06/2014  . Lateral epicondylitis of left elbow 07/06/2014   . 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[place]) 12/16/2013  . Arthralgia of multiple joints 12/16/2013  . Abnormal weight gain 12/16/2013  . 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 Adult hypothyroidism 09/07/2013    Alanson Puls, PT DPT 02/26/2017, 10:05 AM  [place] MAIN [facility] SERVICES [address] [place], [place], [address] Phone: [phone]   Fax:  [phone]  Name: Courtney Alvarez MRN: [id number] Date of Birth: Jan 27, 1976

## 2017-03-03 ENCOUNTER — Encounter: Payer: Self-pay | Admitting: Physical Therapy

## 2017-03-03 ENCOUNTER — Ambulatory Visit: Payer: Federal, State, Local not specified - PPO | Admitting: Physical Therapy

## 2017-03-03 DIAGNOSIS — M542 Cervicalgia: Secondary | ICD-10-CM

## 2017-03-03 DIAGNOSIS — M6281 Muscle weakness (generalized): Secondary | ICD-10-CM | POA: Diagnosis not present

## 2017-03-03 NOTE — Therapy (Signed)
[place] MAIN [facility] SERVICES [address] [place], [place], [address] Phone: [phone]   Fax:  [phone]  Physical Therapy Treatment  Patient Details  Name: Courtney Alvarez MRN: [id number] Date of Birth: 06-Mar-1976 Referring Provider: Meredith Pel   Encounter Date: 03/03/2017  PT End of Session - 03/03/17 0904    Visit Number  7    Number of Visits  17    Date for PT Re-Evaluation  03/26/17    PT Start Time  0815    PT Stop Time  0845    PT Time Calculation (min)  30 min    Activity Tolerance  Patient tolerated treatment well;No increased pain    Behavior During Therapy  WFL for tasks assessed/performed       Past Medical History:  Diagnosis Date  . Arthritis   . Chronic fatigue syndrome   . GERD (gastroesophageal reflux disease)   . Headache    tension/cluster  . Hypothyroidism   . Motion sickness   . Plantar fasciitis of left foot   . PONV (postoperative nausea and vomiting)   . Thyroid disease   . Vaginal Pap smear, abnormal     Past Surgical History:  Procedure Laterality Date  . BLADDER SURGERY    . CERVICAL BIOPSY  W/ LOOP ELECTRODE EXCISION    . COLONOSCOPY WITH PROPOFOL N/A 01/05/2016   Performed by Lucilla Lame, MD at [place]  . POLYPECTOMY  01/05/2016   Performed by Lucilla Lame, MD at [place]    There were no vitals filed for this visit.  Subjective Assessment - 03/03/17 0902    Subjective  Patient is continuing to have 6/10 neck pain and her right arm is doing better. She has stress from nursing school requirments.    Pertinent History  Pt has had symptoms in cervical spine for several years, with increase of symptoms beginning over the last 5 years.  Pt's MRI results revealed disc bulging of C4-5, C5-6 and C6-7.  Pt has hx of headaches, with symptoms increasing as neck pain increases.  Pt has seen a chiropracter in the past for cervical and lumbar pain.  Pt has hx of low back  pain and currently has plantarfascitis of L foot.  Pt has past medical hx of arthritis, chronic fatigue syndrome, GERD, headaches, hypothroidism, and thyroid disease.    Limitations  Walking;Lifting;Standing;House hold activities;Other (comment) increased symptoms with recreaational activities such as running and weight training    How long can you sit comfortably?  45 mins - 1 hour     How long can you stand comfortably?  able to stand, but has increased pain     How long can you walk comfortably?  able to walk with work activities, has increased pain     Diagnostic tests  MRI    Patient Stated Goals  "I want to get back to how I was"    Currently in Pain?  Yes    Pain Score  6     Pain Location  Neck    Pain Orientation  Medial;Lower    Pain Descriptors / Indicators  Aching    Pain Type  Chronic pain    Pain Radiating Towards  upper traps    Pain Onset  More than a month ago    Pain Frequency  Constant    Aggravating Factors   movement and stress    Pain Relieving Factors  rest, ice, meds PT  Multiple Pain Sites  No       Manual: Manual cervical traction 30s hold x 5 bouts with 30s rest between; STM to R cervical extensors, upper trap, and levator scapulae CPA with grade 2 mobilizationsC2-T2, 20s/bout x 1 bout/level; OA release x 5 mins  Stretches to upper trap x 30 sec x 10 BUE    Postural education provided with conversation regarding modifications to seated study positions; Patient reports 0/10 pain following treatment                        PT Education - 03/03/17 0904    Education provided  Yes    Education Details  posture correction    Person(s) Educated  Patient    Methods  Explanation    Comprehension  Verbalized understanding       PT Short Term Goals - 03/03/17 0931      PT SHORT TERM GOAL #1   Title  Pt will demonstrate cervical ROM WFL's without pain or radiating symptoms down R UE.    Baseline  Pt limited in all planes with  increased symptoms in R UE with flexion, R rotation and R sidebending    Time  4    Period  Weeks    Status  Partially Met    Target Date  04/02/17      PT SHORT TERM GOAL #2   Title  Pt will indicate pain of </= 5/10 with activity and cervical movement, in order to demonstrate improved function within pain free range.    Baseline  Worst: 10/10, Best: 5/10, 03/03/17 6/10    Time  4    Period  Weeks    Target Date  04/02/17      PT SHORT TERM GOAL #3   Title  Pt will demonstrate improved NDI score of <or= to 20% indicating min-mod disability in order to demonstrate self-reported improvement of pain and mobility.    Baseline  03/03/17 38%    Time  4    Period  Weeks    Status  On-going    Target Date  04/02/17        PT Long Term Goals - 03/03/17 0909      PT LONG TERM GOAL #1   Title  Pt will demonstrate compliance with HEP in order to manage pain and symptoms.    Time  8    Period  Weeks    Status  Partially Met    Target Date  03/26/17      PT LONG TERM GOAL #2   Title  Pt will indicate pain levels of <or= 2/10 with activity in order to demonstrate improved functional mobility without pain.    Baseline  Worst: 10/10, Best: 5/10, 11/19/`18 6/10    Time  8    Period  Weeks    Status  Partially Met    Target Date  03/26/17      PT LONG TERM GOAL #3   Title  Pt will demonstrate improve strength of postural musculature in order to facilitate proper sitting and standing posture, allowing for improvment with mobility and daily tasks.    Baseline  01/29/17: L rhomboid and middle trap = 4/5, R rhombodi and middle trap = 4-/5, B lower trap = 3+/5    Time  8    Period  Weeks    Status  Partially Met    Target Date  03/26/17  PT LONG TERM GOAL #5   Title  Pt will improve NDI score to <or= to 10% indicating minimal disability level in order to demonstrate self-reported improvement of pain and function.    Baseline  01/29/17: 38% indicating moderate disability    Time  8     Period  Weeks    Status  On-going    Target Date  03/26/17            Plan - 03/03/17 0905    Clinical Impression Statement  Patient continues to have moderate to severe pain in neck and upper traps. She responded to manual therpay , STM and stretching to upper trap muscles. She was able to decrease her pain level following manual intervientions and STM. Patient will benefit form continued skilled PT to improve mobiity and decrease pain.     Rehab Potential  Good    Clinical Impairments Affecting Rehab Potential  High prior level of function, desire to return to running and weight training    PT Frequency  2x / week    PT Duration  8 weeks    PT Treatment/Interventions  ADLs/Self Care Home Management;Aquatic Therapy;Cryotherapy;Electrical Stimulation;Moist Heat;Traction;Ultrasound;Functional mobility training;Therapeutic activities;Therapeutic exercise;Neuromuscular re-education;Patient/family education;Manual techniques;Passive range of motion;Dry needling;Taping    PT Next Visit Plan  continue cervical manual technique and initiate cervical and upper quarter strengthening    PT Home Exercise Plan  scapular retraction, cervical retraction cervical AROM - R rotation with SNAG    Consulted and Agree with Plan of Care  Patient       Patient will benefit from skilled therapeutic intervention in order to improve the following deficits and impairments:  Decreased activity tolerance, Decreased mobility, Decreased range of motion, Decreased strength, Hypomobility, Increased muscle spasms, Postural dysfunction, Pain  Visit Diagnosis: Neck pain  Muscle weakness (generalized)     Problem List Patient Active Problem List   Diagnosis Date Noted  . Family history of colonic polyps   . Benign neoplasm of sigmoid colon   . Immunity status testing 08/01/2015  . Family history of colon cancer 07/31/2015  . Annual physical exam 07/31/2015  . Obesity 07/31/2015  . Foot pain 06/22/2015  .  Loss of feeling or sensation 06/22/2015  . Backhand tennis elbow 07/06/2014  . Lateral epicondylitis of left elbow 07/06/2014  . 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[place]) 12/16/2013  . Arthralgia of multiple joints 12/16/2013  . Abnormal weight gain 12/16/2013  . 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 Adult hypothyroidism 09/07/2013    Alanson Puls, PT DPT 03/03/2017, 9:50 AM  [place] MAIN [facility] SERVICES [address] [place], [place], [address] Phone: [phone]   Fax:  [phone]  Name: Courtney Alvarez MRN: [id number] Date of Birth: Sep 10, 1975

## 2017-03-05 ENCOUNTER — Encounter: Payer: Self-pay | Admitting: Physical Therapy

## 2017-03-05 ENCOUNTER — Ambulatory Visit: Payer: Federal, State, Local not specified - PPO | Admitting: Physical Therapy

## 2017-03-05 DIAGNOSIS — M542 Cervicalgia: Secondary | ICD-10-CM | POA: Diagnosis not present

## 2017-03-05 DIAGNOSIS — M6281 Muscle weakness (generalized): Secondary | ICD-10-CM | POA: Diagnosis not present

## 2017-03-05 NOTE — Therapy (Signed)
[place] MAIN [facility] SERVICES [address] [place], [place], [address] Phone: [phone]   Fax:  [phone]  Physical Therapy Treatment  Patient Details  Name: Courtney Alvarez MRN: [id number] Date of Birth: Jan 16, 1976 Referring Provider: Meredith Pel   Encounter Date: 03/05/2017  PT End of Session - 03/05/17 0812    Visit Number  8    Number of Visits  17    Date for PT Re-Evaluation  03/26/17    PT Start Time  0804    PT Stop Time  0845    PT Time Calculation (min)  41 min    Activity Tolerance  Patient tolerated treatment well;No increased pain    Behavior During Therapy  WFL for tasks assessed/performed       Past Medical History:  Diagnosis Date  . Arthritis   . Chronic fatigue syndrome   . GERD (gastroesophageal reflux disease)   . Headache    tension/cluster  . Hypothyroidism   . Motion sickness   . Plantar fasciitis of left foot   . PONV (postoperative nausea and vomiting)   . Thyroid disease   . Vaginal Pap smear, abnormal     Past Surgical History:  Procedure Laterality Date  . BLADDER SURGERY    . CERVICAL BIOPSY  W/ LOOP ELECTRODE EXCISION    . COLONOSCOPY WITH PROPOFOL N/A 01/05/2016   Procedure: COLONOSCOPY WITH PROPOFOL;  Surgeon: Lucilla Lame, MD;  Location: [place];  Service: Endoscopy;  Laterality: N/A;  . POLYPECTOMY  01/05/2016   Procedure: POLYPECTOMY;  Surgeon: Lucilla Lame, MD;  Location: [place];  Service: Endoscopy;;    There were no vitals filed for this visit.  Subjective Assessment - 03/05/17 0810    Subjective  Patient is continuing to have 4/10 neck pain and her right arm is doing better. She has stress from nursing school requirments.    Pertinent History  Pt has had symptoms in cervical spine for several years, with increase of symptoms beginning over the last 5 years.  Pt's MRI results revealed disc bulging of C4-5, C5-6 and C6-7.  Pt has hx of headaches, with  symptoms increasing as neck pain increases.  Pt has seen a chiropracter in the past for cervical and lumbar pain.  Pt has hx of low back pain and currently has plantarfascitis of L foot.  Pt has past medical hx of arthritis, chronic fatigue syndrome, GERD, headaches, hypothroidism, and thyroid disease.    Limitations  Walking;Lifting;Standing;House hold activities;Other (comment) increased symptoms with recreaational activities such as running and weight training    How long can you sit comfortably?  45 mins - 1 hour     How long can you stand comfortably?  able to stand, but has increased pain     How long can you walk comfortably?  able to walk with work activities, has increased pain     Diagnostic tests  MRI    Patient Stated Goals  "I want to get back to how I was"    Currently in Pain?  No/denies    Pain Score  4     Pain Location  Neck    Pain Orientation  Medial;Lower    Pain Descriptors / Indicators  Aching    Pain Type  Chronic pain    Pain Onset  More than a month ago    Pain Frequency  Constant    Aggravating Factors   reading,  and movement  Pain Relieving Factors  rest and ice and meds    Multiple Pain Sites  No       Manual: Manual cervical traction 30s hold x 5 bouts with 30s rest between; STM to R cervical extensors, upper trap, and levator scapulae CPA with grade 2 mobilizationsC2-T2, 20s/bout x 1 bout/level; Throacic mobs PA T1- T6 , 20s/bout x 2 OA release x 5 mins  Stretches to upper trap x 30 sec x 10 BUE    Postural education provided with conversation regarding modifications to seated study positions; Patient reports 1/10 pain following treatment                       PT Education - 03/05/17 0811    Education provided  Yes    Education Details  posture and correction    Person(s) Educated  Patient    Methods  Explanation;Verbal cues    Comprehension  Verbalized understanding       PT Short Term Goals - 03/03/17 0931       PT SHORT TERM GOAL #1   Title  Pt will demonstrate cervical ROM WFL's without pain or radiating symptoms down R UE.    Baseline  Pt limited in all planes with increased symptoms in R UE with flexion, R rotation and R sidebending    Time  4    Period  Weeks    Status  Partially Met    Target Date  04/02/17      PT SHORT TERM GOAL #2   Title  Pt will indicate pain of </= 5/10 with activity and cervical movement, in order to demonstrate improved function within pain free range.    Baseline  Worst: 10/10, Best: 5/10, 03/03/17 6/10    Time  4    Period  Weeks    Target Date  04/02/17      PT SHORT TERM GOAL #3   Title  Pt will demonstrate improved NDI score of <or= to 20% indicating min-mod disability in order to demonstrate self-reported improvement of pain and mobility.    Baseline  03/03/17 38%    Time  4    Period  Weeks    Status  On-going    Target Date  04/02/17        PT Long Term Goals - 03/03/17 0909      PT LONG TERM GOAL #1   Title  Pt will demonstrate compliance with HEP in order to manage pain and symptoms.    Time  8    Period  Weeks    Status  Partially Met    Target Date  03/26/17      PT LONG TERM GOAL #2   Title  Pt will indicate pain levels of <or= 2/10 with activity in order to demonstrate improved functional mobility without pain.    Baseline  Worst: 10/10, Best: 5/10, 11/19/`18 6/10    Time  8    Period  Weeks    Status  Partially Met    Target Date  03/26/17      PT LONG TERM GOAL #3   Title  Pt will demonstrate improve strength of postural musculature in order to facilitate proper sitting and standing posture, allowing for improvment with mobility and daily tasks.    Baseline  01/29/17: L rhomboid and middle trap = 4/5, R rhombodi and middle trap = 4-/5, B lower trap = 3+/5    Time  8  Period  Weeks    Status  Partially Met    Target Date  03/26/17      PT LONG TERM GOAL #5   Title  Pt will improve NDI score to <or= to 10% indicating minimal  disability level in order to demonstrate self-reported improvement of pain and function.    Baseline  01/29/17: 38% indicating moderate disability    Time  8    Period  Weeks    Status  On-going    Target Date  03/26/17            Plan - 03/05/17 9735    Clinical Impression Statement  Patient presents with pain symptoms today and 4/10 neck pain. Following manual therapy and heat she reports 1/10 pain  in her neck at the end of her session compared to 4/10 neck pain upon arrival.. Continue additional HEP especially cervical retractions. Follow up as scheduled. Pt will benefit from continued therapy to help with her neck pain and improve her pain-free function with her studies and with leisure activities.    Rehab Potential  Good    Clinical Impairments Affecting Rehab Potential  High prior level of function, desire to return to running and weight training    PT Frequency  2x / week    PT Duration  8 weeks    PT Treatment/Interventions  ADLs/Self Care Home Management;Aquatic Therapy;Cryotherapy;Electrical Stimulation;Moist Heat;Traction;Ultrasound;Functional mobility training;Therapeutic activities;Therapeutic exercise;Neuromuscular re-education;Patient/family education;Manual techniques;Passive range of motion;Dry needling;Taping    PT Next Visit Plan  continue cervical manual technique and initiate cervical and upper quarter strengthening    PT Home Exercise Plan  scapular retraction, cervical retraction cervical AROM - R rotation with SNAG    Consulted and Agree with Plan of Care  Patient       Patient will benefit from skilled therapeutic intervention in order to improve the following deficits and impairments:  Decreased activity tolerance, Decreased mobility, Decreased range of motion, Decreased strength, Hypomobility, Increased muscle spasms, Postural dysfunction, Pain  Visit Diagnosis: Neck pain  Muscle weakness (generalized)     Problem List Patient Active Problem List    Diagnosis Date Noted  . Family history of colonic polyps   . Benign neoplasm of sigmoid colon   . Immunity status testing 08/01/2015  . Family history of colon cancer 07/31/2015  . Annual physical exam 07/31/2015  . Obesity 07/31/2015  . Foot pain 06/22/2015  . Loss of feeling or sensation 06/22/2015  . Backhand tennis elbow 07/06/2014  . Lateral epicondylitis of left elbow 07/06/2014  . 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[place]) 12/16/2013  . Arthralgia of multiple joints 12/16/2013  . Abnormal weight gain 12/16/2013  . 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 Adult hypothyroidism 09/07/2013    Arelia Sneddon S,PT DPT 03/05/2017, 8:56 AM  [place] MAIN [facility] SERVICES [address] [place], [place], [address] Phone: [phone]   Fax:  [phone]  Name: Courtney Alvarez MRN: [id number] Date of Birth: 1976-03-03

## 2017-03-10 ENCOUNTER — Encounter: Payer: Self-pay | Admitting: Physical Therapy

## 2017-03-10 ENCOUNTER — Ambulatory Visit: Payer: Federal, State, Local not specified - PPO | Admitting: Physical Therapy

## 2017-03-10 DIAGNOSIS — M6281 Muscle weakness (generalized): Secondary | ICD-10-CM | POA: Diagnosis not present

## 2017-03-10 DIAGNOSIS — M542 Cervicalgia: Secondary | ICD-10-CM | POA: Diagnosis not present

## 2017-03-10 NOTE — Therapy (Signed)
[place] MAIN [facility] SERVICES [address] [place], [place], [address] Phone: [phone]   Fax:  [phone]  Physical Therapy Treatment  Patient Details  Name: Courtney Alvarez MRN: [id number] Date of Birth: [birth year] Referring Provider: Meredith Pel   Encounter Date: 03/10/2017  PT End of Session - 03/10/17 0929    Visit Number  9    Number of Visits  17    Date for PT Re-Evaluation  03/26/17    PT Start Time  0845    PT Stop Time  0930    PT Time Calculation (min)  45 min    Activity Tolerance  Patient tolerated treatment well;No increased pain    Behavior During Therapy  WFL for tasks assessed/performed       Past Medical History:  Diagnosis Date  . Arthritis   . Chronic fatigue syndrome   . GERD (gastroesophageal reflux disease)   . Headache    tension/cluster  . Hypothyroidism   . Motion sickness   . Plantar fasciitis of left foot   . PONV (postoperative nausea and vomiting)   . Thyroid disease   . Vaginal Pap smear, abnormal     Past Surgical History:  Procedure Laterality Date  . BLADDER SURGERY    . CERVICAL BIOPSY  W/ LOOP ELECTRODE EXCISION    . COLONOSCOPY WITH PROPOFOL N/A 01/05/2016   Procedure: COLONOSCOPY WITH PROPOFOL;  Surgeon: Lucilla Lame, MD;  Location: [place];  Service: Endoscopy;  Laterality: N/A;  . POLYPECTOMY  01/05/2016   Procedure: POLYPECTOMY;  Surgeon: Lucilla Lame, MD;  Location: [place];  Service: Endoscopy;;    There were no vitals filed for this visit.  Subjective Assessment - 03/10/17 0928    Subjective  Patient is continuing to have 4/10 neck pain and her right arm has started having intermittent numbness and tingling during the day.      Pertinent History  Pt has had symptoms in cervical spine for several years, with increase of symptoms beginning over the last 5 years.  Pt's MRI results revealed disc bulging of C4-5, C5-6 and C6-7.  Pt has hx of headaches,  with symptoms increasing as neck pain increases.  Pt has seen a chiropracter in the past for cervical and lumbar pain.  Pt has hx of low back pain and currently has plantarfascitis of L foot.  Pt has past medical hx of arthritis, chronic fatigue syndrome, GERD, headaches, hypothroidism, and thyroid disease.    Limitations  Walking;Lifting;Standing;House hold activities;Other (comment) increased symptoms with recreaational activities such as running and weight training    How long can you sit comfortably?  45 mins - 1 hour     How long can you stand comfortably?  able to stand, but has increased pain     How long can you walk comfortably?  able to walk with work activities, has increased pain     Diagnostic tests  MRI    Patient Stated Goals  "I want to get back to how I was"    Currently in Pain?  Yes    Pain Score  4     Pain Location  Neck    Pain Orientation  Medial;Lower    Pain Descriptors / Indicators  Aching    Pain Type  Chronic pain    Pain Radiating Towards  upper traps    Pain Onset  More than a month ago    Pain Frequency  Constant  Aggravating Factors   studying, cooking    Pain Relieving Factors  rest, ice, meds PT    Multiple Pain Sites  No       Manual: Manual cervical traction 30s hold x 5 bouts with 30s rest between; STM to R cervical extensors, upper trap, and levator scapulae CPA with grade 2 mobilizationsC2-T2, 20s/bout x 1 bout/level; Throacic mobs PA T1- T6 , 20s/bout x 2 OA release x 5 mins  Stretches to upper trap x 30 sec x 10 BUE    Postural education provided with conversation regarding modifications to seated study positions; Patient reports 1/10 pain following treatment                       PT Education - 03/10/17 0929    Education provided  Yes    Education Details  posture education    Person(s) Educated  Patient    Methods  Explanation;Tactile cues    Comprehension  Verbalized understanding;Verbal cues required        PT Short Term Goals - 03/03/17 0931      PT SHORT TERM GOAL #1   Title  Pt will demonstrate cervical ROM WFL's without pain or radiating symptoms down R UE.    Baseline  Pt limited in all planes with increased symptoms in R UE with flexion, R rotation and R sidebending    Time  4    Period  Weeks    Status  Partially Met    Target Date  04/02/17      PT SHORT TERM GOAL #2   Title  Pt will indicate pain of </= 5/10 with activity and cervical movement, in order to demonstrate improved function within pain free range.    Baseline  Worst: 10/10, Best: 5/10, 03/03/17 6/10    Time  4    Period  Weeks    Target Date  04/02/17      PT SHORT TERM GOAL #3   Title  Pt will demonstrate improved NDI score of <or= to 20% indicating min-mod disability in order to demonstrate self-reported improvement of pain and mobility.    Baseline  03/03/17 38%    Time  4    Period  Weeks    Status  On-going    Target Date  04/02/17        PT Long Term Goals - 03/03/17 0909      PT LONG TERM GOAL #1   Title  Pt will demonstrate compliance with HEP in order to manage pain and symptoms.    Time  8    Period  Weeks    Status  Partially Met    Target Date  03/26/17      PT LONG TERM GOAL #2   Title  Pt will indicate pain levels of <or= 2/10 with activity in order to demonstrate improved functional mobility without pain.    Baseline  Worst: 10/10, Best: 5/10, 11/19/`18 6/10    Time  8    Period  Weeks    Status  Partially Met    Target Date  03/26/17      PT LONG TERM GOAL #3   Title  Pt will demonstrate improve strength of postural musculature in order to facilitate proper sitting and standing posture, allowing for improvment with mobility and daily tasks.    Baseline  01/29/17: L rhomboid and middle trap = 4/5, R rhombodi and middle trap = 4-/5, B lower trap = 3+/5  Time  8    Period  Weeks    Status  Partially Met    Target Date  03/26/17      PT LONG TERM GOAL #5   Title  Pt will  improve NDI score to <or= to 10% indicating minimal disability level in order to demonstrate self-reported improvement of pain and function.    Baseline  01/29/17: 38% indicating moderate disability    Time  8    Period  Weeks    Status  On-going    Target Date  03/26/17            Plan - 03/10/17 0930    Clinical Impression Statement  Patient continues to have moderate to severe pain in neck and upper traps. She responded to manual therpay , STM and stretching to upper trap muscles. She was able to decrease her pain level following manual intervientions and STM. Patient will benefit form continued skilled PT to improve mobiity and decrease pain.    Rehab Potential  Good    Clinical Impairments Affecting Rehab Potential  High prior level of function, desire to return to running and weight training    PT Frequency  2x / week    PT Duration  8 weeks    PT Treatment/Interventions  ADLs/Self Care Home Management;Aquatic Therapy;Cryotherapy;Electrical Stimulation;Moist Heat;Traction;Ultrasound;Functional mobility training;Therapeutic activities;Therapeutic exercise;Neuromuscular re-education;Patient/family education;Manual techniques;Passive range of motion;Dry needling;Taping    PT Next Visit Plan  , traction cervical, manual therapy    PT Home Exercise Plan  scapular retraction, cervical retraction cervical AROM - R rotation with SNAG    Consulted and Agree with Plan of Care  Patient       Patient will benefit from skilled therapeutic intervention in order to improve the following deficits and impairments:  Decreased activity tolerance, Decreased mobility, Decreased range of motion, Decreased strength, Hypomobility, Increased muscle spasms, Postural dysfunction, Pain  Visit Diagnosis: Neck pain  Muscle weakness (generalized)     Problem List Patient Active Problem List   Diagnosis Date Noted  . Family history of colonic polyps   . Benign neoplasm of sigmoid colon   . Immunity  status testing 08/01/2015  . Family history of colon cancer 07/31/2015  . Annual physical exam 07/31/2015  . Obesity 07/31/2015  . Foot pain 06/22/2015  . Loss of feeling or sensation 06/22/2015  . Backhand tennis elbow 07/06/2014  . Lateral epicondylitis of left elbow 07/06/2014  . 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[place]) 12/16/2013  . Arthralgia of multiple joints 12/16/2013  . Abnormal weight gain 12/16/2013  . 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 Adult hypothyroidism 09/07/2013    Alanson Puls, PT DPT 03/10/2017, 9:32 AM  [place] MAIN [facility] SERVICES [address] [place], [place], [address] Phone: [phone]   Fax:  [phone]  Name: Courtney Alvarez MRN: [id number] Date of Birth: 1975/08/21

## 2017-03-12 ENCOUNTER — Ambulatory Visit: Payer: Federal, State, Local not specified - PPO | Admitting: Physical Therapy

## 2017-03-12 ENCOUNTER — Encounter: Payer: Self-pay | Admitting: Physical Therapy

## 2017-03-12 DIAGNOSIS — M542 Cervicalgia: Secondary | ICD-10-CM | POA: Diagnosis not present

## 2017-03-12 DIAGNOSIS — M6281 Muscle weakness (generalized): Secondary | ICD-10-CM

## 2017-03-12 NOTE — Therapy (Signed)
[place] MAIN [facility] SERVICES [address] [place], [place], [address] Phone: [phone]   Fax:  [phone]  Physical Therapy Treatment  Patient Details  Name: Courtney Alvarez MRN: [id number] Date of Birth: 1975-05-01 Referring Provider: Meredith Pel   Encounter Date: 03/12/2017  PT End of Session - 03/12/17 0816    Visit Number  10  (Pended)     Number of Visits  17  (Pended)     Date for PT Re-Evaluation  03/26/17  (Pended)     PT Start Time  0805  (Pended)     PT Stop Time  0845  (Pended)     PT Time Calculation (min)  40 min  (Pended)     Activity Tolerance  Patient tolerated treatment well;No increased pain  (Pended)     Behavior During Therapy  WFL for tasks assessed/performed  (Pended)        Past Medical History:  Diagnosis Date  . Arthritis   . Chronic fatigue syndrome   . GERD (gastroesophageal reflux disease)   . Headache    tension/cluster  . Hypothyroidism   . Motion sickness   . Plantar fasciitis of left foot   . PONV (postoperative nausea and vomiting)   . Thyroid disease   . Vaginal Pap smear, abnormal     Past Surgical History:  Procedure Laterality Date  . BLADDER SURGERY    . CERVICAL BIOPSY  W/ LOOP ELECTRODE EXCISION    . COLONOSCOPY WITH PROPOFOL N/A 01/05/2016   Procedure: COLONOSCOPY WITH PROPOFOL;  Surgeon: Lucilla Lame, MD;  Location: [place];  Service: Endoscopy;  Laterality: N/A;  . POLYPECTOMY  01/05/2016   Procedure: POLYPECTOMY;  Surgeon: Lucilla Lame, MD;  Location: [place];  Service: Endoscopy;;    There were no vitals filed for this visit.  Subjective Assessment - 03/12/17 0812    Subjective  Patient is waking up with numbness in right arm and thumb, 2nd and 3rd digit and it resolved quickly. She is using a cervical pillow. She is having 4/10 pain to lower cervical neck musculature.     Pertinent History  Pt has had symptoms in cervical spine for several years,  with increase of symptoms beginning over the last 5 years.  Pt's MRI results revealed disc bulging of C4-5, C5-6 and C6-7.  Pt has hx of headaches, with symptoms increasing as neck pain increases.  Pt has seen a chiropracter in the past for cervical and lumbar pain.  Pt has hx of low back pain and currently has plantarfascitis of L foot.  Pt has past medical hx of arthritis, chronic fatigue syndrome, GERD, headaches, hypothroidism, and thyroid disease.    Limitations  Walking;Lifting;Standing;House hold activities;Other (comment) increased symptoms with recreaational activities such as running and weight training    How long can you sit comfortably?  45 mins - 1 hour     How long can you stand comfortably?  able to stand, but has increased pain     How long can you walk comfortably?  able to walk with work activities, has increased pain     Diagnostic tests  MRI    Patient Stated Goals  "I want to get back to how I was"    Currently in Pain?  Yes    Pain Score  4     Pain Location  Neck    Pain Orientation  Medial;Lower    Pain Descriptors / Indicators  Aching  Pain Onset  More than a month ago    Multiple Pain Sites  No       Manual: Manual cervical traction 30s hold x 5 bouts with 30s rest between; STM to R cervical extensors, upper trap, and levator scapulae CPA with grade 2 mobilizationsC2-T2, 20s/bout x 1 bout/level;Throacic mobs PA T1- T6 , 20s/bout x 2 OA release x 5 mins  Stretches to upper trap x 30 sec x 10 BUE Manual traction intermittent with 20-22 lbs x 15 mins     Postural education provided with conversation regarding modifications to seated study positions; Patient reports1/10 pain following treatment                       PT Education - 03/12/17 0816    Education provided  Yes    Education Details  posture and stretching    Person(s) Educated  Patient    Methods  Explanation    Comprehension  Verbalized understanding       PT Short  Term Goals - 03/12/17 0859      PT SHORT TERM GOAL #1   Title  Pt will demonstrate cervical ROM WFL's without pain or radiating symptoms down R UE.    Baseline  Pt limited in all planes with increased symptoms in R UE with flexion, R rotation and R sidebending    Time  4    Period  Weeks    Status  On-going    Target Date  03/26/17      PT SHORT TERM GOAL #2   Title  Pt will indicate pain of </= 5/10 with activity and cervical movement, in order to demonstrate improved function within pain free range.    Baseline  ; 4/10 03/12/17    Time  4    Period  Weeks    Status  On-going    Target Date  03/26/17      PT SHORT TERM GOAL #3   Title  Pt will demonstrate improved NDI score of <or= to 20% indicating min-mod disability in order to demonstrate self-reported improvement of pain and mobility.    Baseline  03/03/17 38%    Time  4    Period  Weeks    Status  On-going    Target Date  03/26/17        PT Long Term Goals - 03/12/17 0858      PT LONG TERM GOAL #1   Title  Pt will demonstrate compliance with HEP in order to manage pain and symptoms.    Time  8    Period  Weeks    Status  On-going    Target Date  03/26/17      PT LONG TERM GOAL #2   Title  Pt will indicate pain levels of <or= 2/10 with activity in order to demonstrate improved functional mobility without pain.    Baseline  Worst: 10/10, Best: 5/10, 11/19/`18 6/10, 03/12/17 4/10    Time  8    Period  Weeks    Status  On-going    Target Date  03/26/17      PT LONG TERM GOAL #3   Title  Pt will demonstrate improve strength of postural musculature in order to facilitate proper sitting and standing posture, allowing for improvment with mobility and daily tasks.    Baseline  01/29/17: L rhomboid and middle trap = 4/5, R rhombodi and middle trap = 4-/5, B lower trap = 3+/5  Time  8    Period  Weeks    Status  On-going    Target Date  03/26/17      PT LONG TERM GOAL #5   Title  Pt will improve NDI score to <or=  to 10% indicating minimal disability level in order to demonstrate self-reported improvement of pain and function.    Baseline  01/29/17: 38% indicating moderate disability    Time  8    Period  Weeks    Status  On-going            Plan - 03/12/17 0857    Clinical Impression Statement  Patient continues to have moderate to severe pain in neck and upper traps. She responded to manual therpay , STM and stretching to upper trap muscles. She was able to decrease her pain level following manual intervientions, traction and STM. Patient will benefit form continued skilled PT to improve mobiity and decrease pain.     Rehab Potential  Good    Clinical Impairments Affecting Rehab Potential  High prior level of function, desire to return to running and weight training    PT Frequency  2x / week    PT Duration  8 weeks    PT Treatment/Interventions  ADLs/Self Care Home Management;Aquatic Therapy;Cryotherapy;Electrical Stimulation;Moist Heat;Traction;Ultrasound;Functional mobility training;Therapeutic activities;Therapeutic exercise;Neuromuscular re-education;Patient/family education;Manual techniques;Passive range of motion;Dry needling;Taping    PT Next Visit Plan  , traction cervical, manual therapy    PT Home Exercise Plan  scapular retraction, cervical retraction cervical AROM - R rotation with SNAG    Consulted and Agree with Plan of Care  Patient       Patient will benefit from skilled therapeutic intervention in order to improve the following deficits and impairments:  Decreased activity tolerance, Decreased mobility, Decreased range of motion, Decreased strength, Hypomobility, Increased muscle spasms, Postural dysfunction, Pain  Visit Diagnosis: Neck pain  Muscle weakness (generalized)     Problem List Patient Active Problem List   Diagnosis Date Noted  . Family history of colonic polyps   . Benign neoplasm of sigmoid colon   . Immunity status testing 08/01/2015  . Family  history of colon cancer 07/31/2015  . Annual physical exam 07/31/2015  . Obesity 07/31/2015  . Foot pain 06/22/2015  . Loss of feeling or sensation 06/22/2015  . Backhand tennis elbow 07/06/2014  . Lateral epicondylitis of left elbow 07/06/2014  . 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[place]) 12/16/2013  . Arthralgia of multiple joints 12/16/2013  . Abnormal weight gain 12/16/2013  . 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 Adult hypothyroidism 09/07/2013    Alanson Puls, PT DPT 03/12/2017, 9:03 AM  [place] MAIN [facility] SERVICES [address] [place], [place], [address] Phone: [phone]   Fax:  [phone]  Name: LAIZA VEENSTRA MRN: [id number] Date of Birth: 08-21-1975

## 2017-03-17 ENCOUNTER — Other Ambulatory Visit: Payer: Self-pay

## 2017-03-17 ENCOUNTER — Encounter: Payer: Self-pay | Admitting: Physical Therapy

## 2017-03-17 ENCOUNTER — Ambulatory Visit: Payer: Federal, State, Local not specified - PPO | Attending: Orthopedic Surgery | Admitting: Physical Therapy

## 2017-03-17 VITALS — BP 124/81 | HR 70

## 2017-03-17 DIAGNOSIS — M542 Cervicalgia: Secondary | ICD-10-CM | POA: Diagnosis not present

## 2017-03-17 DIAGNOSIS — M6281 Muscle weakness (generalized): Secondary | ICD-10-CM | POA: Diagnosis not present

## 2017-03-17 NOTE — Therapy (Signed)
[place] MAIN [facility] SERVICES [address] [place], [place], [address] Phone: [phone]   Fax:  [phone]  Physical Therapy Treatment  Patient Details  Name: Courtney Alvarez MRN: [id number] Date of Birth: February 15, 1976 Referring Provider: Meredith Pel   Encounter Date: 03/17/2017  PT End of Session - 03/17/17 0755    Visit Number  11    Number of Visits  17    Date for PT Re-Evaluation  03/26/17    PT Start Time  0800    PT Stop Time  9563    PT Time Calculation (min)  44 min    Activity Tolerance  Patient tolerated treatment well;No increased pain    Behavior During Therapy  WFL for tasks assessed/performed       Past Medical History:  Diagnosis Date  . Arthritis   . Chronic fatigue syndrome   . GERD (gastroesophageal reflux disease)   . Headache    tension/cluster  . Hypothyroidism   . Motion sickness   . Plantar fasciitis of left foot   . PONV (postoperative nausea and vomiting)   . Thyroid disease   . Vaginal Pap smear, abnormal     Past Surgical History:  Procedure Laterality Date  . BLADDER SURGERY    . CERVICAL BIOPSY  W/ LOOP ELECTRODE EXCISION    . COLONOSCOPY WITH PROPOFOL N/A 01/05/2016   Procedure: COLONOSCOPY WITH PROPOFOL;  Surgeon: Lucilla Lame, MD;  Location: [place];  Service: Endoscopy;  Laterality: N/A;  . POLYPECTOMY  01/05/2016   Procedure: POLYPECTOMY;  Surgeon: Lucilla Lame, MD;  Location: [place];  Service: Endoscopy;;    Vitals:   03/17/17 0804  BP: 124/81  Pulse: 70    Subjective Assessment - 03/17/17 0803    Subjective  Pt reports she is "feeling a little rough today".  She attributes this to stress over the weekend.  Pt reports she responded very well to traction done at last session.      Pertinent History  Pt has had symptoms in cervical spine for several years, with increase of symptoms beginning over the last 5 years.  Pt's MRI results revealed disc bulging of  C4-5, C5-6 and C6-7.  Pt has hx of headaches, with symptoms increasing as neck pain increases.  Pt has seen a chiropracter in the past for cervical and lumbar pain.  Pt has hx of low back pain and currently has plantarfascitis of L foot.  Pt has past medical hx of arthritis, chronic fatigue syndrome, GERD, headaches, hypothroidism, and thyroid disease.    Limitations  Walking;Lifting;Standing;House hold activities;Other (comment) increased symptoms with recreaational activities such as running and weight training    How long can you sit comfortably?  45 mins - 1 hour     How long can you stand comfortably?  able to stand, but has increased pain     How long can you walk comfortably?  able to walk with work activities, has increased pain     Diagnostic tests  MRI    Patient Stated Goals  "I want to get back to how I was"    Currently in Pain?  Yes    Pain Score  5     Pain Location  Neck    Pain Onset  More than a month ago    Pain Frequency  Constant    Multiple Pain Sites  No       TREATMENT   Manual cervical traction 30s  hold x 5 bouts with 30s rest between;  STM to R cervical extensors  CPA with grade 2 mobilizations?C2-T2, 20s/bout x 1 bout/level;?Throacic mobs PA T1- T6 , 20s/bout x 2  Suboccipital release x 5 mins  Stretches to upper trap x 30 sec x 4 BUE  Supine pec stretch with towel roll along thoracic spine x3 minutes  Manual traction with 20-22 lbs x 10 mins?              PT Education - 03/17/17 0755    Education provided  Yes    Education Details  Exercise technique; discussed utilizing destressing activities throughout her day    Person(s) Educated  Patient    Methods  Explanation;Demonstration    Comprehension  Verbalized understanding;Returned demonstration;Need further instruction;Verbal cues required       PT Short Term Goals - 03/12/17 0859      PT SHORT TERM GOAL #1   Title  Pt will demonstrate cervical ROM WFL's without pain or radiating symptoms  down R UE.    Baseline  Pt limited in all planes with increased symptoms in R UE with flexion, R rotation and R sidebending    Time  4    Period  Weeks    Status  On-going    Target Date  03/26/17      PT SHORT TERM GOAL #2   Title  Pt will indicate pain of </= 5/10 with activity and cervical movement, in order to demonstrate improved function within pain free range.    Baseline  ; 4/10 03/12/17    Time  4    Period  Weeks    Status  On-going    Target Date  03/26/17      PT SHORT TERM GOAL #3   Title  Pt will demonstrate improved NDI score of <or= to 20% indicating min-mod disability in order to demonstrate self-reported improvement of pain and mobility.    Baseline  03/03/17 38%    Time  4    Period  Weeks    Status  On-going    Target Date  03/26/17        PT Long Term Goals - 03/12/17 0858      PT LONG TERM GOAL #1   Title  Pt will demonstrate compliance with HEP in order to manage pain and symptoms.    Time  8    Period  Weeks    Status  On-going    Target Date  03/26/17      PT LONG TERM GOAL #2   Title  Pt will indicate pain levels of <or= 2/10 with activity in order to demonstrate improved functional mobility without pain.    Baseline  Worst: 10/10, Best: 5/10, 11/19/`18 6/10, 03/12/17 4/10    Time  8    Period  Weeks    Status  On-going    Target Date  03/26/17      PT LONG TERM GOAL #3   Title  Pt will demonstrate improve strength of postural musculature in order to facilitate proper sitting and standing posture, allowing for improvment with mobility and daily tasks.    Baseline  01/29/17: L rhomboid and middle trap = 4/5, R rhombodi and middle trap = 4-/5, B lower trap = 3+/5    Time  8    Period  Weeks    Status  On-going    Target Date  03/26/17      PT LONG TERM GOAL #5  Title  Pt will improve NDI score to <or= to 10% indicating minimal disability level in order to demonstrate self-reported improvement of pain and function.    Baseline  01/29/17:  38% indicating moderate disability    Time  8    Period  Weeks    Status  On-going            Plan - 03/17/17 0174    Clinical Impression Statement  Pt reported an improvement in symptoms following traction at end of last session so this was repeated again today with a static hold.  The pt continues to present with significantly more tension in R upper trap compared to the L and she tolerated stretching well to this area.  Introduced Geologist, engineering on towel along thoracic spine to address hypomobility in thoracic spine and tight pec musuclature.  She will benefit from continued skilled PT intervention for decreased symptoms and improved QOL.     Rehab Potential  Good    Clinical Impairments Affecting Rehab Potential  High prior level of function, desire to return to running and weight training    PT Frequency  2x / week    PT Duration  8 weeks    PT Treatment/Interventions  ADLs/Self Care Home Management;Aquatic Therapy;Cryotherapy;Electrical Stimulation;Moist Heat;Traction;Ultrasound;Functional mobility training;Therapeutic activities;Therapeutic exercise;Neuromuscular re-education;Patient/family education;Manual techniques;Passive range of motion;Dry needling;Taping    PT Next Visit Plan  , traction cervical, manual therapy    PT Home Exercise Plan  scapular retraction, cervical retraction cervical AROM - R rotation with SNAG    Consulted and Agree with Plan of Care  Patient       Patient will benefit from skilled therapeutic intervention in order to improve the following deficits and impairments:  Decreased activity tolerance, Decreased mobility, Decreased range of motion, Decreased strength, Hypomobility, Increased muscle spasms, Postural dysfunction, Pain  Visit Diagnosis: Neck pain  Muscle weakness (generalized)     Problem List Patient Active Problem List   Diagnosis Date Noted  . Family history of colonic polyps   . Benign neoplasm of sigmoid colon   . Immunity status  testing 08/01/2015  . Family history of colon cancer 07/31/2015  . Annual physical exam 07/31/2015  . Obesity 07/31/2015  . Foot pain 06/22/2015  . Loss of feeling or sensation 06/22/2015  . Backhand tennis elbow 07/06/2014  . Lateral epicondylitis of left elbow 07/06/2014  . 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[place]) 12/16/2013  . Arthralgia of multiple joints 12/16/2013  . Abnormal weight gain 12/16/2013  . 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 Adult hypothyroidism 09/07/2013    Collie Siad PT, DPT 03/17/2017, 8:40 AM  [place] MAIN [facility] SERVICES [address] [place], [place], [address] Phone: [phone]   Fax:  [phone]  Name: YOLUNDA KLOOS MRN: [id number] Date of Birth: 20-Jul-1975

## 2017-03-19 ENCOUNTER — Ambulatory Visit: Payer: Federal, State, Local not specified - PPO | Admitting: Physical Therapy

## 2017-03-19 ENCOUNTER — Encounter: Payer: Self-pay | Admitting: Physical Therapy

## 2017-03-19 DIAGNOSIS — M542 Cervicalgia: Secondary | ICD-10-CM | POA: Diagnosis not present

## 2017-03-19 DIAGNOSIS — M6281 Muscle weakness (generalized): Secondary | ICD-10-CM | POA: Diagnosis not present

## 2017-03-19 NOTE — Therapy (Signed)
[place] MAIN [facility] SERVICES [address] [place], [place], [address] Phone: [phone]   Fax:  [phone]  Physical Therapy Treatment  Patient Details  Name: Courtney Alvarez MRN: [id number] Date of Birth: 02-07-1976 Referring Provider: Meredith Pel   Encounter Date: 03/19/2017  PT End of Session - 03/19/17 0815    Visit Number  12    Number of Visits  17    Date for PT Re-Evaluation  03/26/17    PT Start Time  0803    PT Stop Time  0845    PT Time Calculation (min)  42 min    Activity Tolerance  Patient tolerated treatment well;No increased pain    Behavior During Therapy  WFL for tasks assessed/performed       Past Medical History:  Diagnosis Date  . Arthritis   . Chronic fatigue syndrome   . GERD (gastroesophageal reflux disease)   . Headache    tension/cluster  . Hypothyroidism   . Motion sickness   . Plantar fasciitis of left foot   . PONV (postoperative nausea and vomiting)   . Thyroid disease   . Vaginal Pap smear, abnormal     Past Surgical History:  Procedure Laterality Date  . BLADDER SURGERY    . CERVICAL BIOPSY  W/ LOOP ELECTRODE EXCISION    . COLONOSCOPY WITH PROPOFOL N/A 01/05/2016   Procedure: COLONOSCOPY WITH PROPOFOL;  Surgeon: Lucilla Lame, MD;  Location: [place];  Service: Endoscopy;  Laterality: N/A;  . POLYPECTOMY  01/05/2016   Procedure: POLYPECTOMY;  Surgeon: Lucilla Lame, MD;  Location: [place];  Service: Endoscopy;;    There were no vitals filed for this visit.  Subjective Assessment - 03/19/17 0812    Subjective  Pt reports she responded very well to traction done at last session.  Patient reports that she thinks she responded better to the intermittent traction compared to the constant traction.     Pertinent History  Pt has had symptoms in cervical spine for several years, with increase of symptoms beginning over the last 5 years.  Pt's MRI results revealed disc  bulging of C4-5, C5-6 and C6-7.  Pt has hx of headaches, with symptoms increasing as neck pain increases.  Pt has seen a chiropracter in the past for cervical and lumbar pain.  Pt has hx of low back pain and currently has plantarfascitis of L foot.  Pt has past medical hx of arthritis, chronic fatigue syndrome, GERD, headaches, hypothroidism, and thyroid disease.    Limitations  Walking;Lifting;Standing;House hold activities;Other (comment) increased symptoms with recreaational activities such as running and weight training    How long can you sit comfortably?  45 mins - 1 hour     How long can you stand comfortably?  able to stand, but has increased pain     How long can you walk comfortably?  able to walk with work activities, has increased pain     Diagnostic tests  MRI    Patient Stated Goals  "I want to get back to how I was"    Currently in Pain?  Yes    Pain Score  5     Pain Location  Neck    Pain Orientation  Medial;Lateral    Pain Descriptors / Indicators  Aching    Pain Type  Chronic pain    Pain Radiating Towards  upper traps    Pain Onset  More than a month ago  Pain Frequency  Constant    Aggravating Factors   studying cooking    Pain Relieving Factors  rest, ice, meds , PT    Effect of Pain on Daily Activities  need to rest more    Multiple Pain Sites  No       Manual: cervical traction 30s hold x  25 lbs with 30s rest between; STM to R cervical extensors, upper trap, and levator scapulae CPA with grade 2 mobilizationsC2-T2, 20s/bout x 1 bout/level;Throacic mobs PA T1- T6 , 20s/bout x 2 OA release x 5 mins  Stretches to upper trap x 30 sec x 10 BUE    Postural education provided with conversation regarding modifications to seated study positions; Patient reports4/10 pain following treatment                        PT Education - 03/19/17 0815    Education provided  Yes    Education Details  HEP    Person(s) Educated  Patient    Methods   Explanation    Comprehension  Verbalized understanding       PT Short Term Goals - 03/12/17 0859      PT SHORT TERM GOAL #1   Title  Pt will demonstrate cervical ROM WFL's without pain or radiating symptoms down R UE.    Baseline  Pt limited in all planes with increased symptoms in R UE with flexion, R rotation and R sidebending    Time  4    Period  Weeks    Status  On-going    Target Date  03/26/17      PT SHORT TERM GOAL #2   Title  Pt will indicate pain of </= 5/10 with activity and cervical movement, in order to demonstrate improved function within pain free range.    Baseline  ; 4/10 03/12/17    Time  4    Period  Weeks    Status  On-going    Target Date  03/26/17      PT SHORT TERM GOAL #3   Title  Pt will demonstrate improved NDI score of <or= to 20% indicating min-mod disability in order to demonstrate self-reported improvement of pain and mobility.    Baseline  03/03/17 38%    Time  4    Period  Weeks    Status  On-going    Target Date  03/26/17        PT Long Term Goals - 03/12/17 0858      PT LONG TERM GOAL #1   Title  Pt will demonstrate compliance with HEP in order to manage pain and symptoms.    Time  8    Period  Weeks    Status  On-going    Target Date  03/26/17      PT LONG TERM GOAL #2   Title  Pt will indicate pain levels of <or= 2/10 with activity in order to demonstrate improved functional mobility without pain.    Baseline  Worst: 10/10, Best: 5/10, 11/19/`18 6/10, 03/12/17 4/10    Time  8    Period  Weeks    Status  On-going    Target Date  03/26/17      PT LONG TERM GOAL #3   Title  Pt will demonstrate improve strength of postural musculature in order to facilitate proper sitting and standing posture, allowing for improvment with mobility and daily tasks.    Baseline  01/29/17:  L rhomboid and middle trap = 4/5, R rhombodi and middle trap = 4-/5, B lower trap = 3+/5    Time  8    Period  Weeks    Status  On-going    Target Date   03/26/17      PT LONG TERM GOAL #5   Title  Pt will improve NDI score to <or= to 10% indicating minimal disability level in order to demonstrate self-reported improvement of pain and function.    Baseline  01/29/17: 38% indicating moderate disability    Time  8    Period  Weeks    Status  On-going            Plan - 03/19/17 0815    Clinical Impression Statement  Patient continues to have moderate to severe pain in neck and upper traps. She responded to manual therpay  And cervical traction. STM and stretching to upper trap muscles. She was able to decrease her pain level following manual intervientions and STM. Patient will benefit form continued skilled PT to improve mobiity and decrease pain.    Rehab Potential  Good    Clinical Impairments Affecting Rehab Potential  High prior level of function, desire to return to running and weight training    PT Frequency  2x / week    PT Duration  8 weeks    PT Treatment/Interventions  ADLs/Self Care Home Management;Aquatic Therapy;Cryotherapy;Electrical Stimulation;Moist Heat;Traction;Ultrasound;Functional mobility training;Therapeutic activities;Therapeutic exercise;Neuromuscular re-education;Patient/family education;Manual techniques;Passive range of motion;Dry needling;Taping    PT Next Visit Plan  , traction cervical, manual therapy    PT Home Exercise Plan  scapular retraction, cervical retraction cervical AROM - R rotation with SNAG    Consulted and Agree with Plan of Care  Patient       Patient will benefit from skilled therapeutic intervention in order to improve the following deficits and impairments:  Decreased activity tolerance, Decreased mobility, Decreased range of motion, Decreased strength, Hypomobility, Increased muscle spasms, Postural dysfunction, Pain  Visit Diagnosis: Neck pain  Muscle weakness (generalized)     Problem List Patient Active Problem List   Diagnosis Date Noted  . Family history of colonic polyps    . Benign neoplasm of sigmoid colon   . Immunity status testing 08/01/2015  . Family history of colon cancer 07/31/2015  . Annual physical exam 07/31/2015  . Obesity 07/31/2015  . Foot pain 06/22/2015  . Loss of feeling or sensation 06/22/2015  . Backhand tennis elbow 07/06/2014  . Lateral epicondylitis of left elbow 07/06/2014  . 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[place]) 12/16/2013  . Arthralgia of multiple joints 12/16/2013  . Abnormal weight gain 12/16/2013  . 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 Adult hypothyroidism 09/07/2013    Alanson Puls, PT DPT 03/19/2017, 8:50 AM  [place] MAIN [facility] SERVICES [address] [place], [place], [address] Phone: [phone]   Fax:  [phone]  Name: Courtney Alvarez MRN: [id number] Date of Birth: 1975/10/23

## 2017-03-24 ENCOUNTER — Encounter: Payer: Federal, State, Local not specified - PPO | Admitting: Physical Therapy

## 2017-03-26 ENCOUNTER — Ambulatory Visit: Payer: Federal, State, Local not specified - PPO | Admitting: Physical Therapy

## 2017-04-03 ENCOUNTER — Encounter: Payer: Self-pay | Admitting: Podiatry

## 2017-04-03 DIAGNOSIS — M722 Plantar fascial fibromatosis: Secondary | ICD-10-CM | POA: Diagnosis not present

## 2017-04-03 DIAGNOSIS — K219 Gastro-esophageal reflux disease without esophagitis: Secondary | ICD-10-CM | POA: Diagnosis not present

## 2017-04-04 ENCOUNTER — Encounter: Payer: Federal, State, Local not specified - PPO | Admitting: Podiatry

## 2017-04-11 ENCOUNTER — Encounter: Payer: Self-pay | Admitting: Podiatry

## 2017-04-11 ENCOUNTER — Ambulatory Visit (INDEPENDENT_AMBULATORY_CARE_PROVIDER_SITE_OTHER): Payer: Federal, State, Local not specified - PPO | Admitting: Podiatry

## 2017-04-11 VITALS — BP 131/83 | HR 75 | Temp 98.6°F | Resp 16

## 2017-04-11 DIAGNOSIS — Z9889 Other specified postprocedural states: Secondary | ICD-10-CM

## 2017-04-11 DIAGNOSIS — M722 Plantar fascial fibromatosis: Secondary | ICD-10-CM

## 2017-04-17 NOTE — Progress Notes (Signed)
   Subjective:  Patient presents today status post EPF left. DOS: 04/03/17.  She states she is doing well, but she reports a burning sensation at the heel.  Patient is here for further evaluation and treatment.   Past Medical History:  Diagnosis Date  . Arthritis   . Chronic fatigue syndrome   . GERD (gastroesophageal reflux disease)   . Headache    tension/cluster  . Hypothyroidism   . Motion sickness   . Plantar fasciitis of left foot   . PONV (postoperative nausea and vomiting)   . Thyroid disease   . Vaginal Pap smear, abnormal       Objective/Physical Exam Skin incisions appear to be well coapted with sutures and staples intact. No sign of infectious process noted. No dehiscence. No active bleeding noted. Moderate edema noted to the surgical extremity.  Assessment: 1. s/p EPF left. DOS: 04/03/17   Plan of Care:  1. Patient was evaluated.  2.  Dressings were removed.  Antibiotic ointment and Band-Aid applied. 3.  Continue weightbearing in cam boot. 4.  Continue taking meloxicam. 5.  Return to clinic in 1 week for suture removal.   Edrick Kins, DPM [facility]  Dr. Edrick Kins, [place] [place]                                        [place], [place] [address]                Office [phone]  Fax [phone]

## 2017-04-18 ENCOUNTER — Ambulatory Visit (INDEPENDENT_AMBULATORY_CARE_PROVIDER_SITE_OTHER): Payer: Federal, State, Local not specified - PPO | Admitting: Podiatry

## 2017-04-18 ENCOUNTER — Encounter: Payer: Self-pay | Admitting: Podiatry

## 2017-04-18 DIAGNOSIS — Z9889 Other specified postprocedural states: Secondary | ICD-10-CM | POA: Diagnosis not present

## 2017-04-20 NOTE — Progress Notes (Signed)
   Subjective:  Patient presents today status post left EPF surgery. DOS: 04/03/17.  She states she is doing better.  She reports a dull ache, burning sensation.  She rates her pain at 3/10 currently.  Patient is here for further evaluation and treatment.  Past Medical History:  Diagnosis Date  . Arthritis   . Chronic fatigue syndrome   . GERD (gastroesophageal reflux disease)   . Headache    tension/cluster  . Hypothyroidism   . Motion sickness   . Plantar fasciitis of left foot   . PONV (postoperative nausea and vomiting)   . Thyroid disease   . Vaginal Pap smear, abnormal       Objective/Physical Exam Skin incisions appear to be well coapted with sutures and staples intact. No sign of infectious process noted. No dehiscence. No active bleeding noted. Moderate edema noted to the surgical extremity.  Assessment: 1. s/p left EPF surgery. DOS: 04/03/17   Plan of Care:  1.  Patient was evaluated.  2.  Sutures removed. 3.  Compression anklet dispensed. 4.  Transition from cam boot to good sneakers with custom molded orthotics. 5.  Return to clinic in 2 weeks.   Edrick Kins, DPM [facility]  Dr. Edrick Kins, [place]                                        [place], [place] [address]                Office [phone]  Fax [phone]

## 2017-04-29 ENCOUNTER — Ambulatory Visit (INDEPENDENT_AMBULATORY_CARE_PROVIDER_SITE_OTHER): Payer: Federal, State, Local not specified - PPO | Admitting: Podiatry

## 2017-04-29 ENCOUNTER — Encounter: Payer: Self-pay | Admitting: Podiatry

## 2017-04-29 DIAGNOSIS — Z9889 Other specified postprocedural states: Secondary | ICD-10-CM

## 2017-05-01 NOTE — Progress Notes (Signed)
   Subjective:  Patient presents today status post left EPF surgery. DOS: 04/03/17. She states she is doing well. She reports wearing sneakers with orthotics and compression anklet. Patient is here for further evaluation and treatment.  Past Medical History:  Diagnosis Date  . Arthritis   . Chronic fatigue syndrome   . GERD (gastroesophageal reflux disease)   . Headache    tension/cluster  . Hypothyroidism   . Motion sickness   . Plantar fasciitis of left foot   . PONV (postoperative nausea and vomiting)   . Thyroid disease   . Vaginal Pap smear, abnormal       Objective/Physical Exam Skin incisions healed. No sign of infectious process noted. Negative for pain with palpation to the left plantar fascia.   Assessment: 1. s/p left EPF surgery. DOS: 04/03/17   Plan of Care:  1.  Patient was evaluated.  2.  Resume wearing good sneakers. 3.  Slowly increase activity. 4.  Return to clinic in 4 weeks.  Begins nursing clinicals on 04/30/17.   Edrick Kins, DPM [facility]  Dr. Edrick Kins, [place]                                        [place], [place] [address]                Office [phone]  Fax [phone]

## 2017-05-20 ENCOUNTER — Encounter: Payer: Self-pay | Admitting: Podiatry

## 2017-05-20 ENCOUNTER — Ambulatory Visit (INDEPENDENT_AMBULATORY_CARE_PROVIDER_SITE_OTHER): Payer: Federal, State, Local not specified - PPO | Admitting: Podiatry

## 2017-05-20 ENCOUNTER — Ambulatory Visit (INDEPENDENT_AMBULATORY_CARE_PROVIDER_SITE_OTHER): Payer: Federal, State, Local not specified - PPO

## 2017-05-20 DIAGNOSIS — Z9889 Other specified postprocedural states: Secondary | ICD-10-CM

## 2017-05-20 DIAGNOSIS — M722 Plantar fascial fibromatosis: Secondary | ICD-10-CM | POA: Diagnosis not present

## 2017-05-22 NOTE — Progress Notes (Signed)
   Subjective:  Patient presents today status post left EPF surgery. DOS: 04/03/17. She reports a sharp pain in the left arch that began two weeks ago. She reports associated numbness to the lateral side of the foot. Icing and stretching the area helps alleviate the symptoms. She has been taking Mobic with no significant relief. She discontinued wearing the orthotics stating they cause burning to the foot. Patient is here for further evaluation and treatment.  Past Medical History:  Diagnosis Date  . Arthritis   . Chronic fatigue syndrome   . GERD (gastroesophageal reflux disease)   . Headache    tension/cluster  . Hypothyroidism   . Motion sickness   . Plantar fasciitis of left foot   . PONV (postoperative nausea and vomiting)   . Thyroid disease   . Vaginal Pap smear, abnormal       Objective/Physical Exam Skin incisions healed. No sign of infectious process noted. Negative for pain with palpation to the left plantar fascia. Paresthesia noted with light touch to the lateral plantar foot.   Radiographic Exam:  Normal osseous mineralization. Joint spaces preserved. No fracture/dislocation/boney destruction.    Assessment: 1. s/p left EPF surgery. DOS: 04/03/17   Plan of Care:  1. Patient was evaluated. X-Rays reviewed.  2. Continue wearing custom molded orthotics with good shoe gear. 3. Continue stretching exercises.  4. Return to clinic as needed.   Begins nursing clinicals on 04/30/17.   Edrick Kins, DPM [facility]  Dr. Edrick Kins, [place]                                        [place], [place] [address]                Office [phone]  Fax [phone]

## 2017-05-27 ENCOUNTER — Ambulatory Visit: Payer: Federal, State, Local not specified - PPO | Admitting: Podiatry

## 2017-05-29 DIAGNOSIS — F411 Generalized anxiety disorder: Secondary | ICD-10-CM | POA: Diagnosis not present

## 2017-05-29 DIAGNOSIS — L2089 Other atopic dermatitis: Secondary | ICD-10-CM | POA: Diagnosis not present

## 2017-05-29 DIAGNOSIS — F5105 Insomnia due to other mental disorder: Secondary | ICD-10-CM | POA: Diagnosis not present

## 2017-05-29 DIAGNOSIS — L309 Dermatitis, unspecified: Secondary | ICD-10-CM | POA: Diagnosis not present

## 2017-05-29 DIAGNOSIS — F321 Major depressive disorder, single episode, moderate: Secondary | ICD-10-CM | POA: Diagnosis not present

## 2017-06-01 DIAGNOSIS — Z789 Other specified health status: Secondary | ICD-10-CM | POA: Insufficient documentation

## 2017-06-01 NOTE — Telephone Encounter (Signed)
I came across an old MyChart note from previous provider; immune to varicella Updating problem list Closing note

## 2017-06-11 DIAGNOSIS — F321 Major depressive disorder, single episode, moderate: Secondary | ICD-10-CM | POA: Diagnosis not present

## 2017-06-13 DIAGNOSIS — F411 Generalized anxiety disorder: Secondary | ICD-10-CM | POA: Diagnosis not present

## 2017-06-13 DIAGNOSIS — F5105 Insomnia due to other mental disorder: Secondary | ICD-10-CM | POA: Diagnosis not present

## 2017-06-13 DIAGNOSIS — F321 Major depressive disorder, single episode, moderate: Secondary | ICD-10-CM | POA: Diagnosis not present

## 2017-07-04 DIAGNOSIS — F5105 Insomnia due to other mental disorder: Secondary | ICD-10-CM | POA: Diagnosis not present

## 2017-07-04 DIAGNOSIS — F411 Generalized anxiety disorder: Secondary | ICD-10-CM | POA: Diagnosis not present

## 2017-07-04 DIAGNOSIS — F321 Major depressive disorder, single episode, moderate: Secondary | ICD-10-CM | POA: Diagnosis not present

## 2017-07-09 DIAGNOSIS — F321 Major depressive disorder, single episode, moderate: Secondary | ICD-10-CM | POA: Diagnosis not present

## 2017-07-15 DIAGNOSIS — E039 Hypothyroidism, unspecified: Secondary | ICD-10-CM | POA: Diagnosis not present

## 2017-07-22 DIAGNOSIS — E039 Hypothyroidism, unspecified: Secondary | ICD-10-CM | POA: Diagnosis not present

## 2017-07-22 DIAGNOSIS — E041 Nontoxic single thyroid nodule: Secondary | ICD-10-CM | POA: Diagnosis not present

## 2017-07-22 DIAGNOSIS — R5382 Chronic fatigue, unspecified: Secondary | ICD-10-CM | POA: Diagnosis not present

## 2017-07-22 DIAGNOSIS — E669 Obesity, unspecified: Secondary | ICD-10-CM | POA: Diagnosis not present

## 2017-07-31 DIAGNOSIS — F321 Major depressive disorder, single episode, moderate: Secondary | ICD-10-CM | POA: Diagnosis not present

## 2017-08-07 DIAGNOSIS — J0140 Acute pansinusitis, unspecified: Secondary | ICD-10-CM | POA: Diagnosis not present

## 2017-08-07 DIAGNOSIS — F321 Major depressive disorder, single episode, moderate: Secondary | ICD-10-CM | POA: Diagnosis not present

## 2017-08-07 DIAGNOSIS — F5105 Insomnia due to other mental disorder: Secondary | ICD-10-CM | POA: Diagnosis not present

## 2017-08-07 DIAGNOSIS — F411 Generalized anxiety disorder: Secondary | ICD-10-CM | POA: Diagnosis not present

## 2017-08-12 DIAGNOSIS — H6122 Impacted cerumen, left ear: Secondary | ICD-10-CM | POA: Diagnosis not present

## 2017-08-12 DIAGNOSIS — H60502 Unspecified acute noninfective otitis externa, left ear: Secondary | ICD-10-CM | POA: Diagnosis not present

## 2017-08-12 DIAGNOSIS — H6121 Impacted cerumen, right ear: Secondary | ICD-10-CM | POA: Diagnosis not present

## 2017-08-20 DIAGNOSIS — F321 Major depressive disorder, single episode, moderate: Secondary | ICD-10-CM | POA: Diagnosis not present

## 2017-08-29 DIAGNOSIS — R5383 Other fatigue: Secondary | ICD-10-CM | POA: Diagnosis not present

## 2017-08-29 DIAGNOSIS — J0140 Acute pansinusitis, unspecified: Secondary | ICD-10-CM | POA: Diagnosis not present

## 2017-09-01 ENCOUNTER — Ambulatory Visit: Payer: Federal, State, Local not specified - PPO | Admitting: Nurse Practitioner

## 2017-09-01 ENCOUNTER — Encounter: Payer: Self-pay | Admitting: Nurse Practitioner

## 2017-09-01 VITALS — BP 118/62 | HR 90 | Temp 98.6°F | Resp 16 | Ht 67.0 in | Wt 207.6 lb

## 2017-09-01 DIAGNOSIS — J0101 Acute recurrent maxillary sinusitis: Secondary | ICD-10-CM

## 2017-09-01 DIAGNOSIS — E559 Vitamin D deficiency, unspecified: Secondary | ICD-10-CM | POA: Diagnosis not present

## 2017-09-01 DIAGNOSIS — E039 Hypothyroidism, unspecified: Secondary | ICD-10-CM

## 2017-09-01 DIAGNOSIS — R5383 Other fatigue: Secondary | ICD-10-CM

## 2017-09-01 NOTE — Progress Notes (Addendum)
Name: Courtney Alvarez   MRN: [id number]    DOB: July 03, 1975   Date:09/01/2017       Progress Note  Subjective  Chief Complaint  Chief Complaint  Patient presents with  . Sinusitis    seen at [facility] 2 times. Cough, congested sinus pressure and fatigue for 2 weeks. Currently on Doxycycline.     HPI  Sinusitis Patient has chronic sinusitis with acute episodes over the past 2 weeks- patient was seen at [facility] rx augmentin completed the course- no relief of symptoms, seen at [facility] again complete left ear lavage and rx abx ear drop for 1 week QID and started on doxycycline 4 days ago. Patient endorses decrease facial pressure, more nasal drainage, bad taste in mouth.   Patient does endorses increased fatigue over the last 2 weeks. Pt endorses more frequent naps- states never takes naps in the past. Patient states sees endocrinology last visit 4/9 and dose was decreased to 120mg  of armour 6 days a week and half dose- 60mg  one day a week on sundays- has been on this dose for 6 weeks now. Sts endocrinologist left practice and recommended PCP or new endo for follow-up of thyroid. Patient states goes to sleeps at 9pm and wakes up at 6pm. Drinks over 64 ounces of water a day.  No fever or chills, rashes, shob,   Patient Active Problem List   Diagnosis Date Noted  . Immune to varicella 06/01/2017  . Family history of colonic polyps   . Benign neoplasm of sigmoid colon   . Immunity status testing 08/01/2015  . Family history of colon cancer 07/31/2015  . Annual physical exam 07/31/2015  . Obesity 07/31/2015  . Foot pain 06/22/2015  . Loss of feeling or sensation 06/22/2015  . Backhand tennis elbow 07/06/2014  . Lateral epicondylitis of left elbow 07/06/2014  . 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[place]) 12/16/2013  . Arthralgia of multiple joints 12/16/2013  . Abnormal weight gain 12/16/2013  . 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 Adult hypothyroidism 09/07/2013    Past  Medical History:  Diagnosis Date  . Arthritis   . Chronic fatigue syndrome   . GERD (gastroesophageal reflux disease)   . Headache    tension/cluster  . Hypothyroidism   . Motion sickness   . Plantar fasciitis of left foot   . PONV (postoperative nausea and vomiting)   . Thyroid disease   . Vaginal Pap smear, abnormal     Past Surgical History:  Procedure Laterality Date  . BLADDER SURGERY    . CERVICAL BIOPSY  W/ LOOP ELECTRODE EXCISION    . COLONOSCOPY WITH PROPOFOL N/A 01/05/2016   Procedure: COLONOSCOPY WITH PROPOFOL;  Surgeon: Lucilla Lame, MD;  Location: [place];  Service: Endoscopy;  Laterality: N/A;  . POLYPECTOMY  01/05/2016   Procedure: POLYPECTOMY;  Surgeon: Lucilla Lame, MD;  Location: [place];  Service: Endoscopy;;    Social History   Tobacco Use  . Smoking status: Former Smoker    Last attempt to quit: 06/14/2003    Years since quitting: 14.2  . Smokeless tobacco: Never Used  . Tobacco comment: quit 06/2003  Substance Use Topics  . Alcohol use: Yes    Alcohol/week: 1.2 oz    Types: 2 Glasses of wine per week    Comment: occasionally      Current Outpatient Medications:  .  calcium carbonate (TUMS - DOSED IN MG ELEMENTAL CALCIUM) 500 MG chewable tablet, Chew 1 tablet by  mouth daily., Disp: , Rfl:  .  Cholecalciferol (PA VITAMIN D-3 GUMMY PO), Take by mouth., Disp: , Rfl:  .  Crisaborole (EUCRISA) 2 % OINT, Apply TO AFFECTED AREA(S) OF RASH ON ARMS EVERY DAY AS NEEDED, Disp: , Rfl:  .  doxycycline (VIBRA-TABS) 100 MG tablet, TK 1 T PO BID FOR 10 DAYS, Disp: , Rfl: 0 .  lansoprazole (PREVACID) 15 MG capsule, Take by mouth., Disp: , Rfl:  .  levonorgestrel (MIRENA) 20 MCG/24HR IUD, 1 each by Intrauterine route once., Disp: , Rfl:  .  Multiple Vitamins-Minerals (MULTIVITAMIN GUMMIES WOMENS) CHEW, Chew by mouth., Disp: , Rfl:  .  pseudoephedrine-acetaminophen (TYLENOL SINUS) 30-500 MG TABS tablet, Take 1 tablet by mouth every 6 (six) hours as  needed., Disp: , Rfl:  .  temazepam (RESTORIL) 15 MG capsule, Take by mouth., Disp: , Rfl:  .  thyroid (ARMOUR) 120 MG tablet, Take 120 mg by mouth daily before breakfast., Disp: , Rfl:  .  venlafaxine XR (EFFEXOR-XR) 150 MG 24 hr capsule, TK 1 C PO D, Disp: , Rfl: 1 .  ranitidine (ZANTAC) 150 MG tablet, Take 150 mg by mouth as needed for heartburn., Disp: , Rfl:  .  vitamin B-12 (CYANOCOBALAMIN) 1000 MCG tablet, Take 1,000 mcg by mouth daily., Disp: , Rfl:   Current Facility-Administered Medications:  .  betamethasone acetate-betamethasone sodium phosphate (CELESTONE) injection 3 mg, 3 mg, Intramuscular, Once, Evans, Brent M, DPM .  triamcinolone acetonide (KENALOG) 10 MG/ML injection 10 mg, 10 mg, Other, Once, Stover, Titorya, DPM  No Known Allergies  ROS  No other specific complaints in a complete review of systems (except as listed in HPI above).  Objective  Vitals:   09/01/17 1107 09/01/17 1211  BP: 118/62   Pulse: (!) 104 90  Resp: 16   Temp: 98.6 F (37 C)   TempSrc: Oral   SpO2: 96%   Weight: 207 lb 9.6 oz (94.2 kg)   Height: 5\' 7"  (1.702 m)      Body mass index is 32.51 kg/m.  Nursing Note and Vital Signs reviewed.  Physical Exam   Constitutional: Patient appears well-developed and well-nourished.  No distress.  HEENT: head atraumatic, normocephalic, pupils equal and reactive to light, TM's without erythema or bulging, no maxillary or frontal sinus tenderness , neck supple without lymphadenopathy- no goiter or nodules palpated, oropharynx pink and moist without exudate, no nasal discharge Cardiovascular: Normal rate, regular rhythm, S1/S2 present.  No murmur or rub heard. Pulses intact  Pulmonary/Chest: Effort normal and breath sounds clear. No respiratory distress or retractions. Psychiatric: Patient has a normal mood and affect. behavior is normal. Judgment and thought content normal.  No results found for this or any previous visit (from the past 72  hour(s)).  Assessment & Plan  1. Other fatigue - possibly due to sinus infection, thyroid, electrolyte imbalance will follow-up in one month  - BASIC METABOLIC PANEL WITH GFR - Magnesium - TSH 2. Acute recurrent maxillary sinusitis -continue abx therapy till complete, rest and hydration   3. Adult hypothyroidism -continue current therapies  - TSH  5. Vitamin D deficiency -continue multi-vitamin  - Vitamin D (25 hydroxy)    -Red flags and when to present for emergency care or RTC including fever >101.59F, chest pain, shortness of breath, new/worsening/un-resolving symptoms, reviewed with patient at time of visit. Follow up and care instructions discussed and provided in AVS. -Reviewed Health Maintenance: update tetanus today  --------------------------------- I have reviewed this encounter including the documentation  in this note and/or discussed this patient with the provider, Suezanne Cheshire DNP AGNP-C. I am certifying that I agree with the content of this note as supervising physician. Enid Derry, [facility] 09/09/2017, 1:36 PM

## 2017-09-01 NOTE — Patient Instructions (Signed)

## 2017-09-02 ENCOUNTER — Other Ambulatory Visit: Payer: Self-pay | Admitting: Nurse Practitioner

## 2017-09-02 ENCOUNTER — Telehealth: Payer: Self-pay | Admitting: Nurse Practitioner

## 2017-09-02 DIAGNOSIS — E039 Hypothyroidism, unspecified: Secondary | ICD-10-CM

## 2017-09-02 NOTE — Telephone Encounter (Signed)
Can we add on T3 & T4 due to low TSH

## 2017-09-03 ENCOUNTER — Ambulatory Visit (INDEPENDENT_AMBULATORY_CARE_PROVIDER_SITE_OTHER): Payer: Federal, State, Local not specified - PPO | Admitting: Obstetrics and Gynecology

## 2017-09-03 ENCOUNTER — Encounter: Payer: Self-pay | Admitting: Obstetrics and Gynecology

## 2017-09-03 VITALS — BP 100/63 | HR 80 | Ht 66.0 in | Wt 209.9 lb

## 2017-09-03 DIAGNOSIS — E6609 Other obesity due to excess calories: Secondary | ICD-10-CM

## 2017-09-03 DIAGNOSIS — Z01419 Encounter for gynecological examination (general) (routine) without abnormal findings: Secondary | ICD-10-CM | POA: Diagnosis not present

## 2017-09-03 DIAGNOSIS — Z30431 Encounter for routine checking of intrauterine contraceptive device: Secondary | ICD-10-CM | POA: Diagnosis not present

## 2017-09-03 NOTE — Progress Notes (Signed)
Subjective:   Courtney Alvarez is a 42 y.o. G57P1 Caucasian female here for a routine well-woman exam.  No LMP recorded. (Menstrual status: IUD).    Current complaints: fatigue- saw PCP and had labs drawn. Also seeing Dr Nicolasa Ducking for mental health. PCP: [facility]       Doesn't need labs  Social History: Sexual: heterosexual Marital Status: married Living situation: with family Occupation: unemployed Tobacco/alcohol: no tobacco use Illicit drugs: no history of illicit drug use  The following portions of the patient's history were reviewed and updated as appropriate: allergies, current medications, past family history, past medical history, past social history, past surgical history and problem list.  Past Medical History Past Medical History:  Diagnosis Date  . Arthritis   . Chronic fatigue syndrome   . GERD (gastroesophageal reflux disease)   . Headache    tension/cluster  . Hypothyroidism   . Motion sickness   . Plantar fasciitis of left foot   . PONV (postoperative nausea and vomiting)   . Thyroid disease   . Vaginal Pap smear, abnormal     Past Surgical History Past Surgical History:  Procedure Laterality Date  . BLADDER SURGERY    . CERVICAL BIOPSY  W/ LOOP ELECTRODE EXCISION    . COLONOSCOPY WITH PROPOFOL N/A 01/05/2016   Procedure: COLONOSCOPY WITH PROPOFOL;  Surgeon: Lucilla Lame, MD;  Location: [facility];  Service: Endoscopy;  Laterality: N/A;  . POLYPECTOMY  01/05/2016   Procedure: POLYPECTOMY;  Surgeon: Lucilla Lame, MD;  Location: [place];  Service: Endoscopy;;    Gynecologic History G1P1  No LMP recorded. (Menstrual status: IUD). Contraception: IUD Last Pap: 08/2016. Results were: normal Last mammogram: 09/2016. Results were: f/u films normal   Obstetric History OB History  Gravida Para Term Preterm AB Living  1 1          SAB TAB Ectopic Multiple Live Births               # Outcome Date GA Lbr Len/2nd Weight Sex Delivery Anes PTL Lv  1  Para 2006    F Vag-Spont       Current Medications Current Outpatient Medications on File Prior to Visit  Medication Sig Dispense Refill  . Cholecalciferol (PA VITAMIN D-3 GUMMY PO) Take by mouth.    . Crisaborole (EUCRISA) 2 % OINT Apply TO AFFECTED AREA(S) OF RASH ON ARMS EVERY DAY AS NEEDED    . doxycycline (VIBRA-TABS) 100 MG tablet TK 1 T PO BID FOR 10 DAYS  0  . lansoprazole (PREVACID) 15 MG capsule Take by mouth.    . levonorgestrel (MIRENA) 20 MCG/24HR IUD 1 each by Intrauterine route once.    . Multiple Vitamins-Minerals (MULTIVITAMIN GUMMIES WOMENS) CHEW Chew by mouth.    . temazepam (RESTORIL) 15 MG capsule Take by mouth.    . thyroid (ARMOUR) 120 MG tablet Take 120 mg by mouth daily before breakfast.    . venlafaxine XR (EFFEXOR-XR) 150 MG 24 hr capsule TK 1 C PO D  1  . calcium carbonate (TUMS - DOSED IN MG ELEMENTAL CALCIUM) 500 MG chewable tablet Chew 1 tablet by mouth daily.    . pseudoephedrine-acetaminophen (TYLENOL SINUS) 30-500 MG TABS tablet Take 1 tablet by mouth every 6 (six) hours as needed.     Current Facility-Administered Medications on File Prior to Visit  Medication Dose Route Frequency Provider Last Rate Last Dose  . betamethasone acetate-betamethasone sodium phosphate (CELESTONE) injection 3 mg  3 mg Intramuscular Once  Edrick Kins, DPM      . triamcinolone acetonide (KENALOG) 10 MG/ML injection 10 mg  10 mg Other Once Landis Martins, DPM        Review of Systems Patient denies any headaches, blurred vision, shortness of breath, chest pain, abdominal pain, problems with bowel movements, urination, or intercourse.  Objective:  BP 100/63   Pulse 80   Ht 5\' 6"  (1.676 m)   Wt 209 lb 14.4 oz (95.2 kg)   BMI 33.88 kg/m  Physical Exam  General:  Well developed, well nourished, no acute distress. She is alert and oriented x3. Skin:  Warm and dry Neck:  Midline trachea, no thyromegaly or nodules Cardiovascular: Regular rate and rhythm, no murmur  heard Lungs:  Effort normal, all lung fields clear to auscultation bilaterally Breasts:  No dominant palpable mass, retraction, or nipple discharge Abdomen:  Soft, non tender, no hepatosplenomegaly or masses Pelvic:  External genitalia is normal in appearance.  The vagina is normal in appearance. The cervix is bulbous, no CMT. IUD string noted. Thin prep pap is not done . Uterus is felt to be normal size, shape, and contour.  No adnexal masses or tenderness noted. Extremities:  No swelling or varicosities noted Psych:  She has a normal mood and affect  Assessment:   Healthy well-woman exam IUD check obesity  Plan:   F/U 1 year for AE, or sooner if needed Mammogram ordered  Davonne Jarnigan Rockney Ghee, CNM

## 2017-09-03 NOTE — Patient Instructions (Signed)
Preventive Care 18-39 Years, Female Preventive care refers to lifestyle choices and visits with your health care provider that can promote health and wellness. What does preventive care include?  A yearly physical exam. This is also called an annual well check.  Dental exams once or twice a year.  Routine eye exams. Ask your health care provider how often you should have your eyes checked.  Personal lifestyle choices, including: ? Daily care of your teeth and gums. ? Regular physical activity. ? Eating a healthy diet. ? Avoiding tobacco and drug use. ? Limiting alcohol use. ? Practicing safe sex. ? Taking vitamin and mineral supplements as recommended by your health care provider. What happens during an annual well check? The services and screenings done by your health care provider during your annual well check will depend on your age, overall health, lifestyle risk factors, and family history of disease. Counseling Your health care provider may ask you questions about your:  Alcohol use.  Tobacco use.  Drug use.  Emotional well-being.  Home and relationship well-being.  Sexual activity.  Eating habits.  Work and work Statistician.  Method of birth control.  Menstrual cycle.  Pregnancy history.  Screening You may have the following tests or measurements:  Height, weight, and BMI.  Diabetes screening. This is done by checking your blood sugar (glucose) after you have not eaten for a while (fasting).  Blood pressure.  Lipid and cholesterol levels. These may be checked every 5 years starting at age 38.  Skin check.  Hepatitis C blood test.  Hepatitis B blood test.  Sexually transmitted disease (STD) testing.  BRCA-related cancer screening. This may be done if you have a family history of breast, ovarian, tubal, or peritoneal cancers.  Pelvic exam and Pap test. This may be done every 3 years starting at age 38. Starting at age 30, this may be done  every 5 years if you have a Pap test in combination with an HPV test.  Discuss your test results, treatment options, and if necessary, the need for more tests with your health care provider. Vaccines Your health care provider may recommend certain vaccines, such as:  Influenza vaccine. This is recommended every year.  Tetanus, diphtheria, and acellular pertussis (Tdap, Td) vaccine. You may need a Td booster every 10 years.  Varicella vaccine. You may need this if you have not been vaccinated.  HPV vaccine. If you are 39 or younger, you may need three doses over 6 months.  Measles, mumps, and rubella (MMR) vaccine. You may need at least one dose of MMR. You may also need a second dose.  Pneumococcal 13-valent conjugate (PCV13) vaccine. You may need this if you have certain conditions and were not previously vaccinated.  Pneumococcal polysaccharide (PPSV23) vaccine. You may need one or two doses if you smoke cigarettes or if you have certain conditions.  Meningococcal vaccine. One dose is recommended if you are age 68-21 years and a first-year college student living in a residence hall, or if you have one of several medical conditions. You may also need additional booster doses.  Hepatitis A vaccine. You may need this if you have certain conditions or if you travel or work in places where you may be exposed to hepatitis A.  Hepatitis B vaccine. You may need this if you have certain conditions or if you travel or work in places where you may be exposed to hepatitis B.  Haemophilus influenzae type b (Hib) vaccine. You may need this  if you have certain risk factors.  Talk to your health care provider about which screenings and vaccines you need and how often you need them. This information is not intended to replace advice given to you by your health care provider. Make sure you discuss any questions you have with your health care provider. Document Released: 05/28/2001 Document Revised:  12/20/2015 Document Reviewed: 01/31/2015 Elsevier Interactive Patient Education  2018 Elsevier Inc.  

## 2017-09-03 NOTE — Telephone Encounter (Signed)
Cindy at [facility] to call and see if labs can be added on.

## 2017-09-04 LAB — TEST AUTHORIZATION

## 2017-09-04 LAB — T4: T4 TOTAL: 7.3 ug/dL (ref 5.1–11.9)

## 2017-09-04 LAB — T3: T3 TOTAL: 147 ng/dL (ref 76–181)

## 2017-09-04 LAB — BASIC METABOLIC PANEL WITH GFR
BUN: 16 mg/dL (ref 7–25)
CHLORIDE: 105 mmol/L (ref 98–110)
CO2: 29 mmol/L (ref 20–32)
CREATININE: 0.75 mg/dL (ref 0.50–1.10)
Calcium: 9.4 mg/dL (ref 8.6–10.2)
GFR, Est African American: 114 mL/min/{1.73_m2} (ref >=60)
GFR, Est Non African American: 98 mL/min/{1.73_m2} (ref >=60)
GLUCOSE: 111 mg/dL (ref 65–139)
POTASSIUM: 4.5 mmol/L (ref 3.5–5.3)
Sodium: 139 mmol/L (ref 135–146)

## 2017-09-04 LAB — VITAMIN D 25 HYDROXY (VIT D DEFICIENCY, FRACTURES): VIT D 25 HYDROXY: 35 ng/mL (ref 30–100)

## 2017-09-04 LAB — MAGNESIUM: MAGNESIUM: 2.2 mg/dL (ref 1.5–2.5)

## 2017-09-04 LAB — TSH: TSH: 0.28 m[IU]/L — AB

## 2017-09-15 DIAGNOSIS — F411 Generalized anxiety disorder: Secondary | ICD-10-CM | POA: Diagnosis not present

## 2017-09-15 DIAGNOSIS — F321 Major depressive disorder, single episode, moderate: Secondary | ICD-10-CM | POA: Diagnosis not present

## 2017-09-15 DIAGNOSIS — F5105 Insomnia due to other mental disorder: Secondary | ICD-10-CM | POA: Diagnosis not present

## 2017-09-24 IMAGING — MG MM DIGITAL SCREENING BILAT W/ TOMO W/ CAD
8 of 13 series · 8 of 29 positions shown · non-contrast
Comparison: Previous exam(s).

CLINICAL DATA: Screening.

EXAM:
2D DIGITAL SCREENING BILATERAL MAMMOGRAM WITH CAD AND ADJUNCT TOMO

[L MLO (1 of 2)]
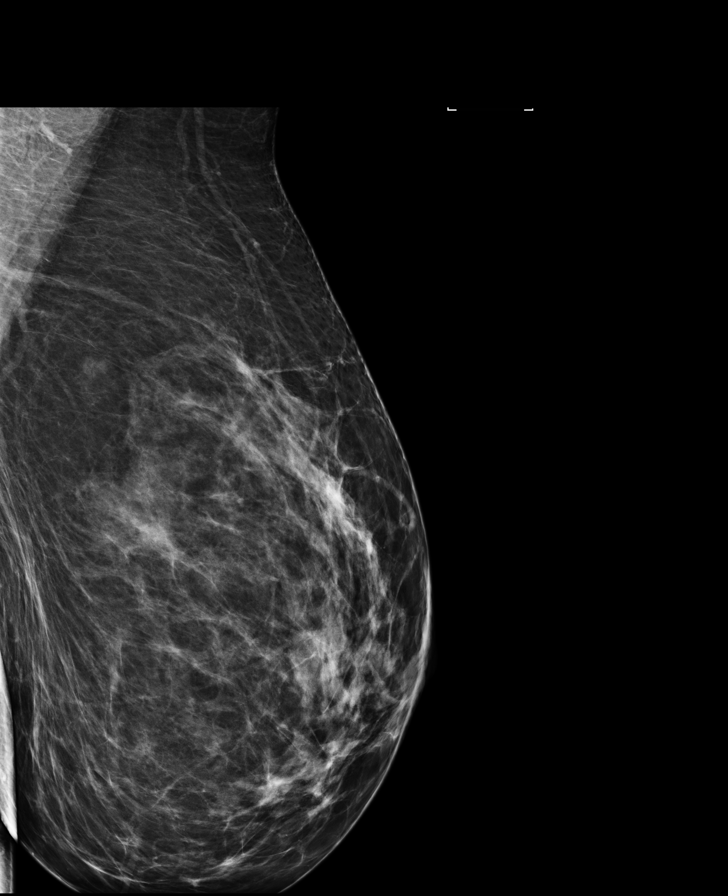

[R CC synth-2D]
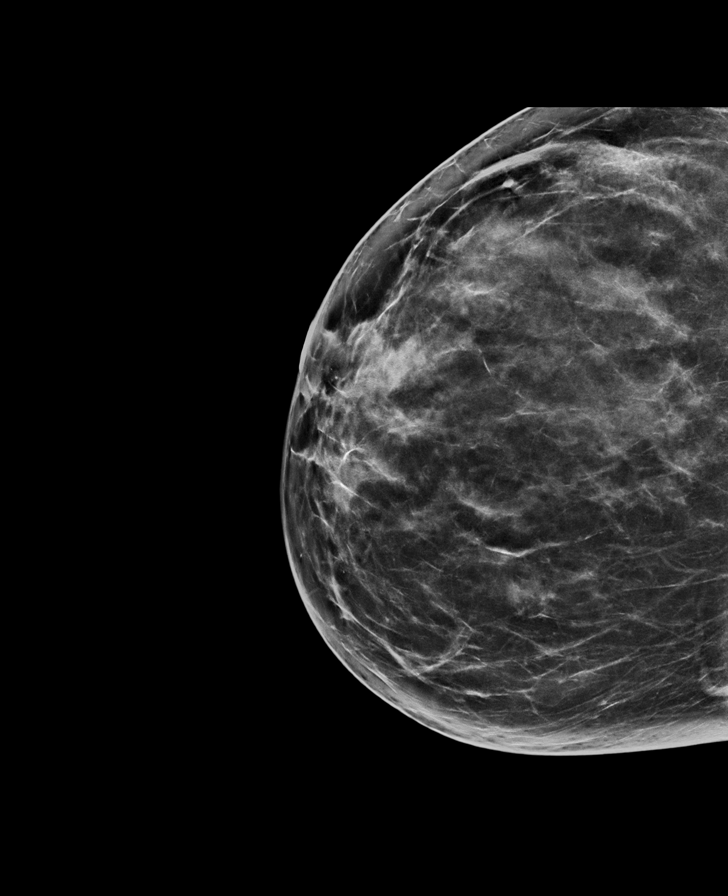

[R MLO]
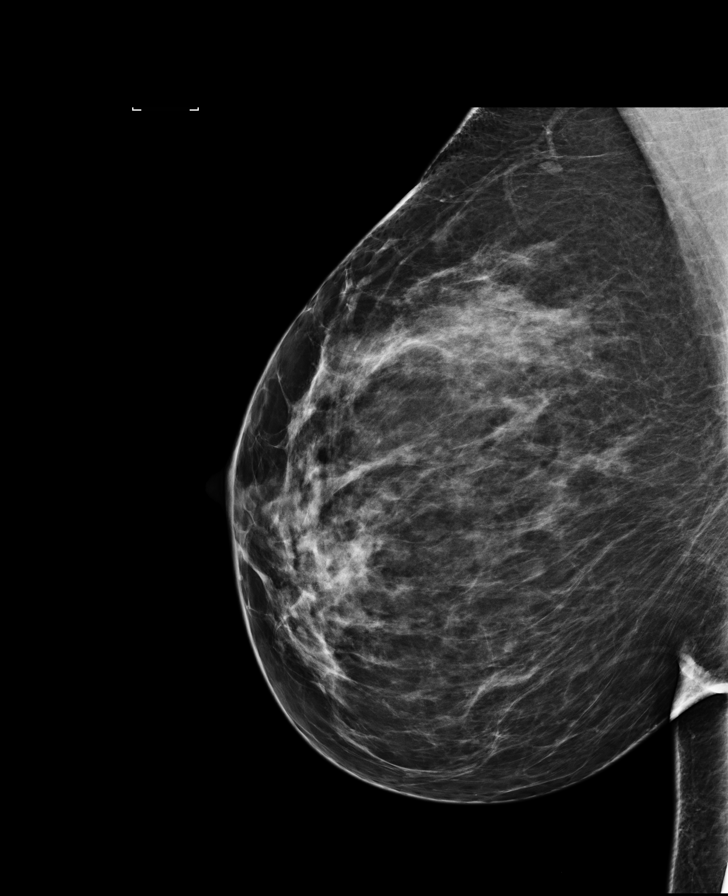

[L MLO synth-2D]
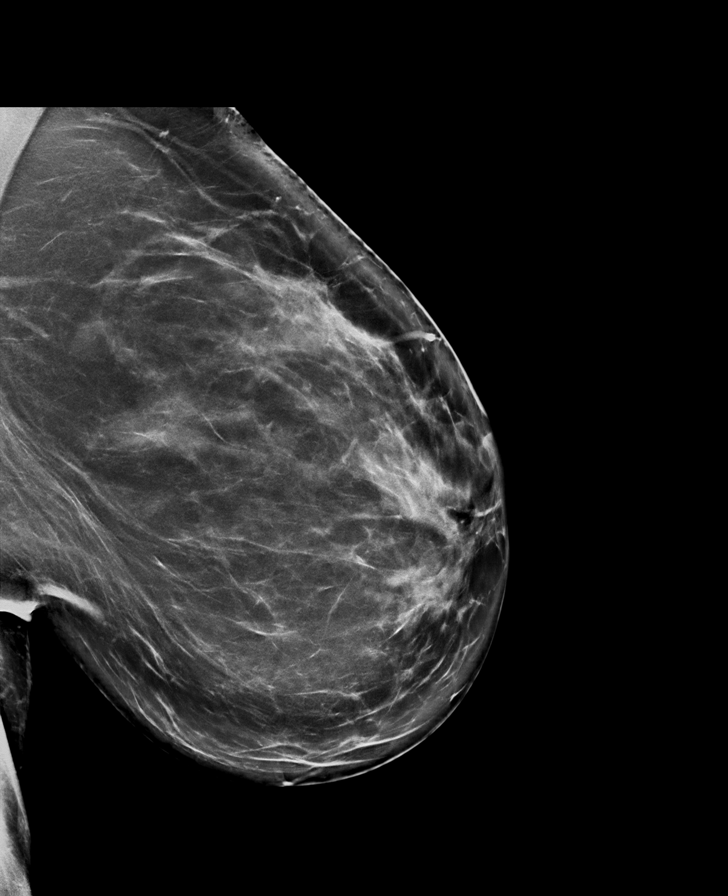

[L MLO (2 of 2)]
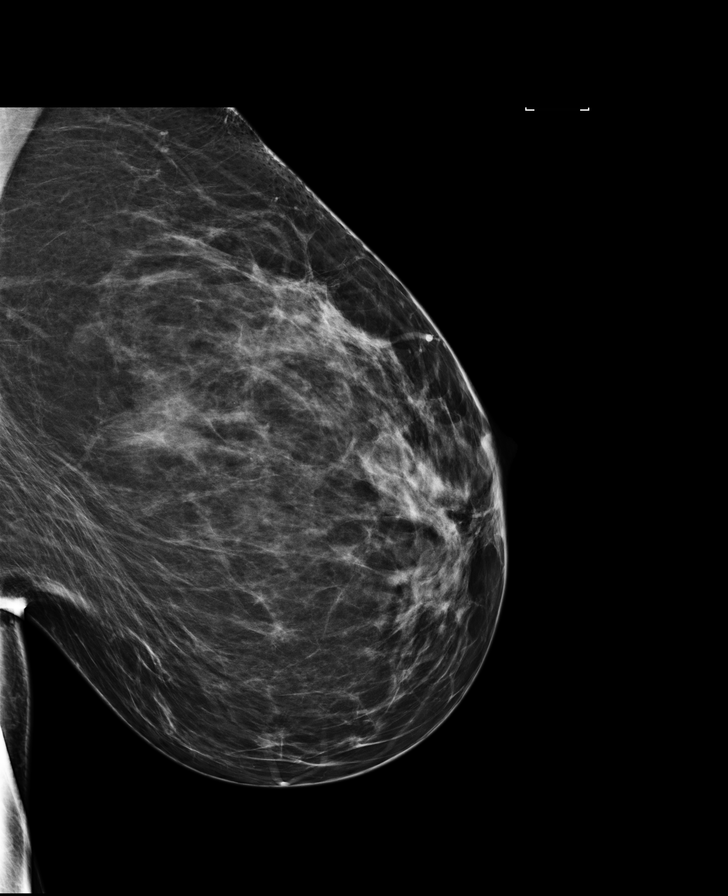

[L CC synth-2D]
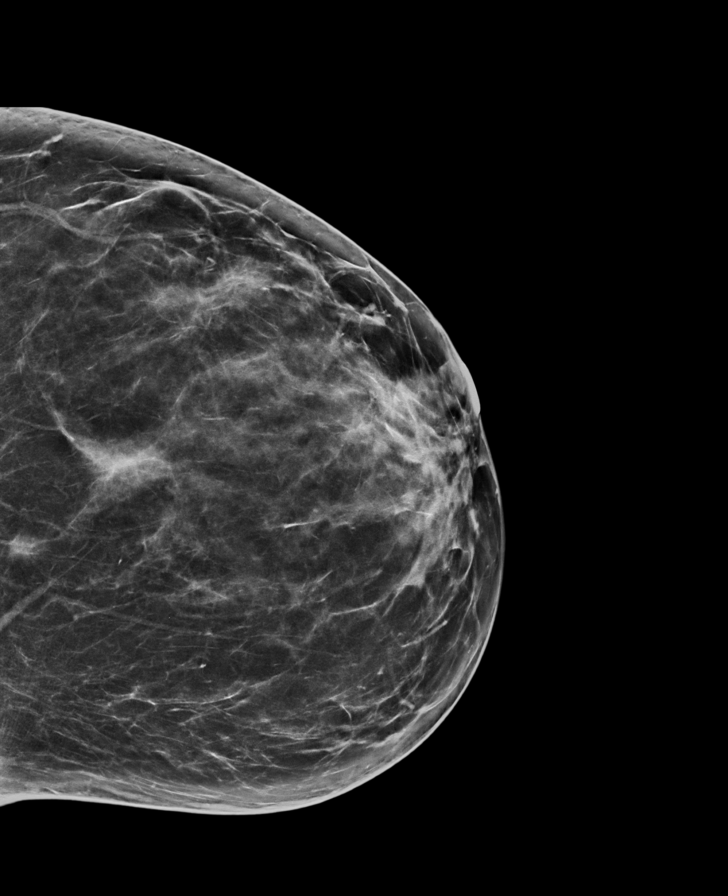

[R MLO synth-2D]
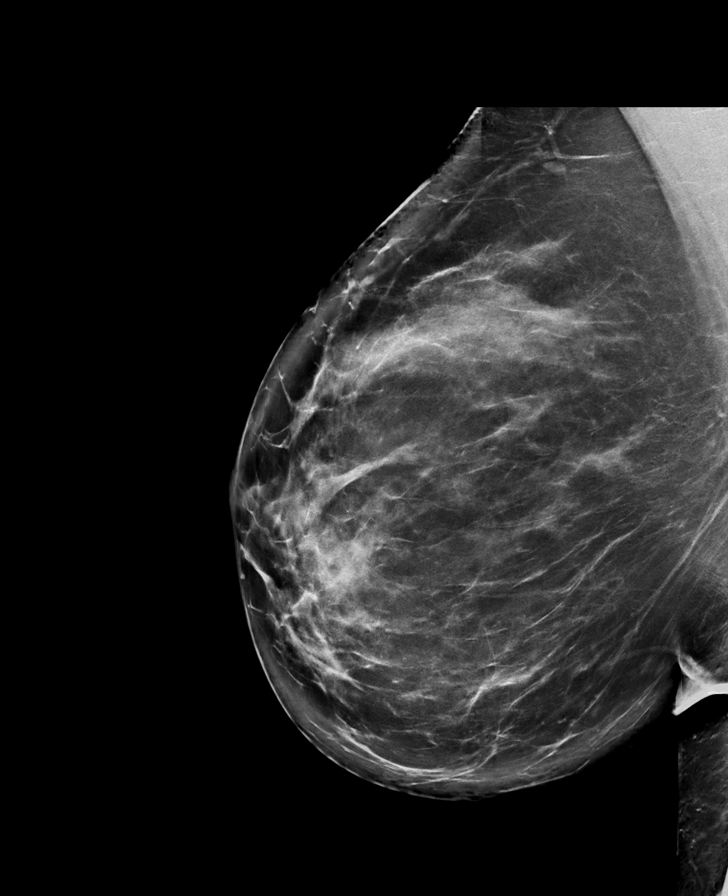

[L CC]
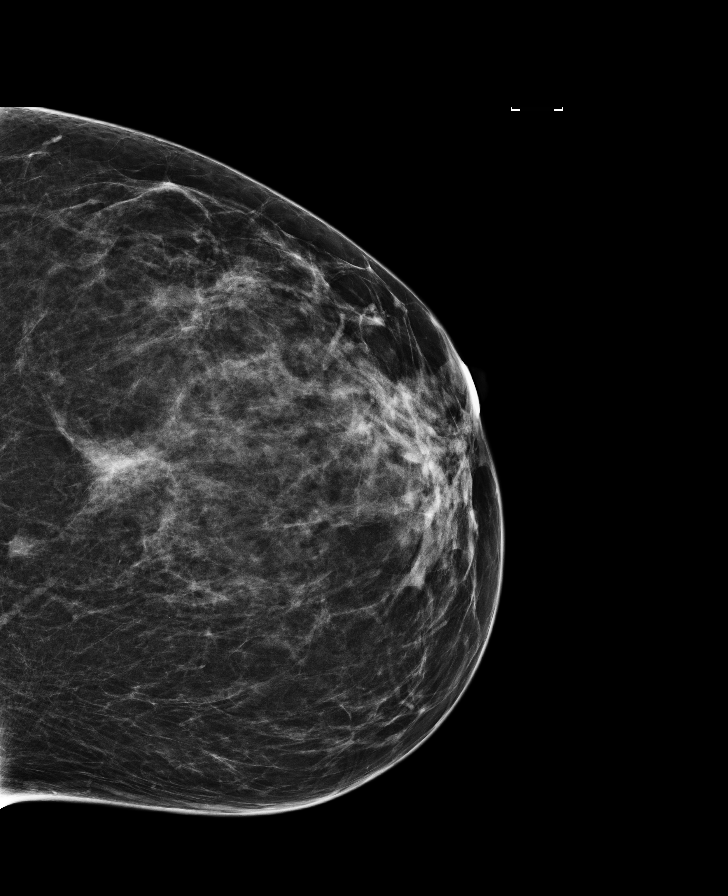

[8 of 29 positions shown; findings below may reference images not displayed]

ACR Breast Density Category b: There are scattered areas of
fibroglandular density.
FINDINGS: In the left breast, a possible asymmetry warrants further
evaluation. In the right breast, no findings suspicious for
malignancy. Images were processed with CAD.
IMPRESSION: Further evaluation is suggested for possible asymmetry in the left
breast.

RECOMMENDATION:
Diagnostic mammogram and possibly ultrasound of the left breast.
(Code:[id number]-[id number])

The patient will be contacted regarding the findings, and additional
imaging will be scheduled.

BI-RADS CATEGORY  0: Incomplete. Need additional imaging evaluation
and/or prior mammograms for comparison.

## 2017-10-02 ENCOUNTER — Ambulatory Visit: Payer: Federal, State, Local not specified - PPO | Admitting: Family Medicine

## 2017-10-02 ENCOUNTER — Encounter: Payer: Self-pay | Admitting: Family Medicine

## 2017-10-02 VITALS — BP 118/74 | HR 88 | Temp 98.0°F | Resp 12 | Ht 66.0 in | Wt 208.5 lb

## 2017-10-02 DIAGNOSIS — E6609 Other obesity due to excess calories: Secondary | ICD-10-CM

## 2017-10-02 DIAGNOSIS — E042 Nontoxic multinodular goiter: Secondary | ICD-10-CM | POA: Diagnosis not present

## 2017-10-02 DIAGNOSIS — E039 Hypothyroidism, unspecified: Secondary | ICD-10-CM | POA: Diagnosis not present

## 2017-10-02 DIAGNOSIS — Z23 Encounter for immunization: Secondary | ICD-10-CM | POA: Diagnosis not present

## 2017-10-02 DIAGNOSIS — Z6833 Body mass index (BMI) 33.0-33.9, adult: Secondary | ICD-10-CM

## 2017-10-02 MED ORDER — TETANUS-DIPHTH-ACELL PERTUSSIS 5-2.5-18.5 LF-MCG/0.5 IM SUSP
0.5000 mL | Freq: Once | INTRAMUSCULAR | Status: AC
  Administered 2017-10-02: 0.5 mL via INTRAMUSCULAR

## 2017-10-02 NOTE — Progress Notes (Signed)
BP 118/74   Pulse 88   Temp 98 F (36.7 C) (Oral)   Resp 12   Ht 5\' 6"  (1.676 m)   Wt 208 lb 8 oz (94.6 kg)   SpO2 93%   BMI 33.65 kg/m    Subjective:    Patient ID: Courtney Alvarez, female    DOB: 02/20/1976, 42 y.o.   MRN: [id number]  HPI: Courtney Alvarez is a 42 y.o. female  Chief Complaint  Patient presents with  . Follow-up    HPI She is here for f/u She saw Suezanne Cheshire, NP in May and patient is here for f/u from that visit; note reviewed She had two bouts of antibiotics after three trips to urgent care; she had some labs drawn, and her vitamin D was low She is feeling better; energy has gotten better now that her infection has cleared Took augmentin, took it all and was good for 4-5 days, then started feeling rough again; then had sinus infection and ear infection; then doxycycline Nursing licensure testing done, was preparing for that when sick; some sleep deprivation  TSH was abnormal, 0.28 on 09/01/2017; reviewed other labs, all mildly low TSH over the last year No weight loss, not feeling jittery  She was seeing Dr. Eddie Dibbles at [facility]; she said she could follow-up with PCP ---------------------------------------------------------------- Reviewed note from 2019: Has 1.38cm right lower pole nodule and cysts initially seen on thyroid ultrasound done in 07/10. I recommended a FNA but she declined. Follow-up thyroid US in 2012 and 2014 and most recently 2016 did not show any changes. Denies increase in size or compressive symptoms. No family h/o thyroid cancer. No personal h/o any cancer  Most recent thyroid US: Thyroid US 10/18: Hypoechoic heterogeneous thyroid gland which is mildly enlarged. There is a stable 0.89 cm right-sided thyroid nodule.  ---------------------------------------------------------------  Obesity; she would like to lose 30 pounds; she is exercising and really trying Trying to eat more fruits and veggies; drinking plenty of  water  Depression screen [facility] 2/9 10/02/2017 11/11/2016 07/05/2016 07/31/2015 12/16/2014  Decreased Interest 0 0 0 0 0  Down, Depressed, Hopeless 0 0 0 0 0  PHQ - 2 Score 0 0 0 0 0  Altered sleeping 0 - - - -  Tired, decreased energy 1 - - - -  Change in appetite 0 - - - -  Feeling bad or failure about yourself  0 - - - -  Trouble concentrating 0 - - - -  Moving slowly or fidgety/restless 0 - - - -  Suicidal thoughts 0 - - - -  PHQ-9 Score 1 - - - -  Difficult doing work/chores Not difficult at all - - - -    Relevant past medical, surgical, family and social history reviewed Past Medical History:  Diagnosis Date  . Arthritis   . Chronic fatigue syndrome   . GERD (gastroesophageal reflux disease)   . Headache    tension/cluster  . Hypothyroidism   . Motion sickness   . Plantar fasciitis of left foot   . PONV (postoperative nausea and vomiting)   . Thyroid disease   . Vaginal Pap smear, abnormal    Past Surgical History:  Procedure Laterality Date  . BLADDER SURGERY    . CERVICAL BIOPSY  W/ LOOP ELECTRODE EXCISION    . COLONOSCOPY WITH PROPOFOL N/A 01/05/2016   Procedure: COLONOSCOPY WITH PROPOFOL;  Surgeon: Lucilla Lame, MD;  Location: [place];  Service: Endoscopy;  Laterality:  N/A;  . POLYPECTOMY  01/05/2016   Procedure: POLYPECTOMY;  Surgeon: Lucilla Lame, MD;  Location: [place];  Service: Endoscopy;;   Family History  Problem Relation Age of Onset  . Arthritis Mother   . COPD Mother   . Thyroid disease Mother   . Arthritis Father   . Hyperlipidemia Father   . Cancer Father        polyp removed  . Hashimoto's thyroiditis Sister   . Arthritis Maternal Grandmother   . Cancer Maternal Grandmother        colon  . Heart disease Maternal Grandmother   . Thyroid disease Maternal Grandmother   . Alcohol abuse Maternal Grandfather   . Arthritis Maternal Grandfather   . Arthritis Paternal Grandmother   . Depression Paternal Grandmother   . Hypertension  Paternal Grandmother   . Stroke Paternal Grandmother   . Arthritis Paternal Grandfather   . Heart disease Paternal Grandfather   . Hypertension Paternal Grandfather   . Breast cancer Neg Hx    Social History   Tobacco Use  . Smoking status: Former Smoker    Last attempt to quit: 06/14/2003    Years since quitting: 14.3  . Smokeless tobacco: Never Used  . Tobacco comment: quit 06/2003  Substance Use Topics  . Alcohol use: Yes    Alcohol/week: 0.0 oz    Comment: occasionally   . Drug use: No    Interim medical history since last visit reviewed. Allergies and medications reviewed  Review of Systems Per HPI unless specifically indicated above     Objective:    BP 118/74   Pulse 88   Temp 98 F (36.7 C) (Oral)   Resp 12   Ht 5\' 6"  (1.676 m)   Wt 208 lb 8 oz (94.6 kg)   SpO2 93%   BMI 33.65 kg/m   Wt Readings from Last 3 Encounters:  10/02/17 208 lb 8 oz (94.6 kg)  09/03/17 209 lb 14.4 oz (95.2 kg)  09/01/17 207 lb 9.6 oz (94.2 kg)    Physical Exam  Constitutional: She appears well-developed and well-nourished. No distress.  obese  HENT:  Head: Normocephalic and atraumatic.  Eyes: EOM are normal. No scleral icterus.  Neck: Thyroid mass present. No thyromegaly present.    Cardiovascular: Normal rate, regular rhythm and normal heart sounds.  No murmur heard. Pulmonary/Chest: Effort normal and breath sounds normal. No respiratory distress. She has no wheezes.  Abdominal: Soft. Bowel sounds are normal. She exhibits no distension.  Musculoskeletal: Normal range of motion. She exhibits no edema.  Neurological: She is alert.  Skin: Skin is warm and dry. She is not diaphoretic. No pallor.  Psychiatric: She has a normal mood and affect. Her behavior is normal. Judgment and thought content normal.    Results for orders placed or performed in visit on 09/01/17  TSH  Result Value Ref Range   TSH 0.28 (L) mIU/L  BASIC METABOLIC PANEL WITH GFR  Result Value Ref Range    Glucose, Bld 111 65 - 139 mg/dL   BUN 16 7 - 25 mg/dL   Creat 0.75 0.50 - 1.10 mg/dL   GFR, Est Non African American 98 > OR = 60 mL/min/1.99m2   GFR, Est African American 114 > OR = 60 mL/min/1.36m2   BUN/Creatinine Ratio NOT APPLICABLE 6 - 22 (calc)   Sodium 139 135 - 146 mmol/L   Potassium 4.5 3.5 - 5.3 mmol/L   Chloride 105 98 - 110 mmol/L   CO2  29 20 - 32 mmol/L   Calcium 9.4 8.6 - 10.2 mg/dL  VITAMIN D 25 Hydroxy (Vit-D Deficiency, Fractures)  Result Value Ref Range   Vit D, 25-Hydroxy 35 30 - 100 ng/mL  Magnesium  Result Value Ref Range   Magnesium 2.2 1.5 - 2.5 mg/dL  T4  Result Value Ref Range   T4, Total 7.3 5.1 - 11.9 mcg/dL  T3  Result Value Ref Range   T3, Total 147 76 - 181 ng/dL  TEST AUTHORIZATION  Result Value Ref Range   TEST NAME: T3, TOTAL T4 (THYROXINE), TOT    TEST CODE: 859XLL3 [id number]    CLIENT CONTACT: DR POULOSE    REPORT ALWAYS MESSAGE SIGNATURE        Assessment & Plan:   Problem List Items Addressed This Visit      Endocrine   Goiter, nontoxic, multinodular - Primary    Reviewed endo notes; will be glad to manage here; pt to notify me of any compressive symptoms or change in weight, energy, bowels, etc.      Relevant Medications   thyroid (ARMOUR) 120 MG tablet   Adult hypothyroidism    I will be happy to refill the medicine when needed      Relevant Medications   thyroid (ARMOUR) 120 MG tablet     Other   Obesity    She is losing and working on this; hydration, activity; see AVS; she will try to do this over the next year       Other Visit Diagnoses    Need for Tdap vaccination       Relevant Medications   Tdap (BOOSTRIX) injection 0.5 mL (Completed)       Follow up plan: No follow-ups on file.  An after-visit summary was printed and given to the patient at [place].  Please see the patient instructions which may contain other information and recommendations beyond what is mentioned above in the assessment and  plan.  Meds ordered this encounter  Medications  . Tdap (BOOSTRIX) injection 0.5 mL    No orders of the defined types were placed in this encounter.

## 2017-10-02 NOTE — Assessment & Plan Note (Signed)
Reviewed endo notes; will be glad to manage here; pt to notify me of any compressive symptoms or change in weight, energy, bowels, etc.

## 2017-10-02 NOTE — Patient Instructions (Addendum)
Check out the information at familydoctor.org entitled "Nutrition for Weight Loss: What You Need to Know about Fad Diets" Try to lose between 1-2 pounds per week by taking in fewer calories and burning off more calories You can succeed by limiting portions, limiting foods dense in calories and fat, becoming more active, and drinking 8 glasses of water a day (64 ounces) Don't skip meals, especially breakfast, as skipping meals may alter your metabolism Do not use over-the-counter weight loss pills or gimmicks that claim rapid weight loss A healthy BMI (or body mass index) is between 18.5 and 24.9 You can calculate your ideal BMI at the [place] website ClubMonetize.fr  You received the vaccine to protect against tetanus and diphtheria and pertussis today; the tetanus and diphtheria portions will provide protection up to ten years, and the pertussis component will give you protection against whooping cough for life   Preventing Unhealthy Weight Gain, Adult Staying at a healthy weight is important. When fat builds up in your body, you may become overweight or obese. These conditions put you at greater risk for developing certain health problems, such as heart disease, diabetes, sleeping problems, joint problems, and some cancers. Unhealthy weight gain is often the result of making unhealthy choices in what you eat. It is also a result of not getting enough exercise. You can make changes to your lifestyle to prevent obesity and stay as healthy as possible. What nutrition changes can be made? To maintain a healthy weight and prevent obesity:  Eat only as much as your body needs. To do this: ? Pay attention to signs that you are hungry or full. Stop eating as soon as you feel full. ? If you feel hungry, try drinking water first. Drink enough water so your urine is clear or pale yellow. ? Eat smaller portions. ? Look at serving sizes on food labels. Most  foods contain more than one serving per container. ? Eat the recommended amount of calories for your gender and activity level. While most active people should eat around 2,000 calories per day, if you are trying to lose weight or are not very active, you main need to eat less calories. Talk to your health care provider or dietitian about how many calories you should eat each day.  Choose healthy foods, such as: ? Fruits and vegetables. Try to fill at least half of your plate at each meal with fruits and vegetables. ? Whole grains, such as whole wheat bread, brown rice, and quinoa. ? Lean meats, such as chicken or fish. ? Other healthy proteins, such as beans, eggs, or tofu. ? Healthy fats, such as nuts, seeds, fatty fish, and olive oil. ? Low-fat or fat-free dairy.  Check food labels and avoid food and drinks that: ? Are high in calories. ? Have added sugar. ? Are high in sodium. ? Have saturated fats or trans fats.  Limit how much you eat of the following foods: ? Prepackaged meals. ? Fast food. ? Fried foods. ? Processed meat, such as bacon, sausage, and deli meats. ? Fatty cuts of red meat and poultry with skin.  Cook foods in healthier ways, such as by baking, broiling, or grilling.  When grocery shopping, try to shop around the outside of the store. This helps you buy mostly fresh foods and avoid canned and prepackaged foods.  What lifestyle changes can be made?  Exercise at least 30 minutes 5 or more days each week. Exercising includes brisk walking, yard work, biking, running, swimming,  and team sports like basketball and soccer. Ask your health care provider which exercises are safe for you.  Do not use any products that contain nicotine or tobacco, such as cigarettes and e-cigarettes. If you need help quitting, ask your health care provider.  Limit alcohol intake to no more than 1 drink a day for nonpregnant women and 2 drinks a day for men. One drink equals 12 oz of beer,  5 oz of wine, or 1 oz of hard liquor.  Try to get 7-9 hours of sleep each night. What other changes can be made?  Keep a food and activity journal to keep track of: ? What you ate and how many calories you had. Remember to count sauces, dressings, and side dishes. ? Whether you were active, and what exercises you did. ? Your calorie, weight, and activity goals.  Check your weight regularly. Track any changes. If you notice you have gained weight, make changes to your diet or activity routine.  Avoid taking weight-loss medicines or supplements. Talk to your health care provider before starting any new medicine or supplement.  Talk to your health care provider before trying any new diet or exercise plan. Why are these changes important? Eating healthy, staying active, and having healthy habits not only help prevent obesity, they also:  Help you to manage stress and emotions.  Help you to connect with friends and family.  Improve your self-esteem.  Improve your sleep.  Prevent long-term health problems.  What can happen if changes are not made? Being obese or overweight can cause you to develop joint or bone problems, which can make it hard for you to stay active or do activities you enjoy. Being obese or overweight also puts stress on your heart and lungs and can lead to health problems like diabetes, heart disease, and some cancers. Where to find more information: Talk with your health care provider or a dietitian about healthy eating and healthy lifestyle choices. You may also find other information through these resources:  U.S. Department of Agriculture MyPlate: FormerBoss.no  American Heart Association: www.heart.org  Centers for Disease Control and Prevention: http://www.wolf.info/  Summary  Staying at a healthy weight is important. It helps prevent certain diseases and health problems, such as heart disease, diabetes, joint problems, sleep disorders, and some  cancers.  Being obese or overweight can cause you to develop joint or bone problems, which can make it hard for you to stay active or do activities you enjoy.  You can prevent unhealthy weight gain by eating a healthy diet, exercising regularly, not smoking, limiting alcohol, and getting enough sleep.  Talk with your health care provider or a dietitian for guidance about healthy eating and healthy lifestyle choices. This information is not intended to replace advice given to you by your health care provider. Make sure you discuss any questions you have with your health care provider. Document Released: 04/02/2016 Document Revised: 05/08/2016 Document Reviewed: 05/08/2016 Elsevier Interactive Patient Education  [employer].

## 2017-10-02 NOTE — Assessment & Plan Note (Signed)
I will be happy to refill the medicine when needed

## 2017-10-02 NOTE — Assessment & Plan Note (Signed)
She is losing and working on this; hydration, activity; see AVS; she will try to do this over the next year

## 2017-10-09 ENCOUNTER — Other Ambulatory Visit: Payer: Self-pay | Admitting: Obstetrics and Gynecology

## 2017-10-09 DIAGNOSIS — Z1231 Encounter for screening mammogram for malignant neoplasm of breast: Secondary | ICD-10-CM

## 2017-10-13 DIAGNOSIS — F321 Major depressive disorder, single episode, moderate: Secondary | ICD-10-CM | POA: Diagnosis not present

## 2017-10-14 DIAGNOSIS — F411 Generalized anxiety disorder: Secondary | ICD-10-CM | POA: Diagnosis not present

## 2017-10-14 DIAGNOSIS — F5105 Insomnia due to other mental disorder: Secondary | ICD-10-CM | POA: Diagnosis not present

## 2017-10-14 DIAGNOSIS — F321 Major depressive disorder, single episode, moderate: Secondary | ICD-10-CM | POA: Diagnosis not present

## 2017-10-21 ENCOUNTER — Ambulatory Visit
Admission: RE | Admit: 2017-10-21 | Discharge: 2017-10-21 | Disposition: A | Discharge status: Home / Self Care | Payer: Federal, State, Local not specified - PPO | Source: Ambulatory Visit | Attending: Obstetrics and Gynecology | Admitting: Obstetrics and Gynecology

## 2017-10-21 DIAGNOSIS — Z1231 Encounter for screening mammogram for malignant neoplasm of breast: Secondary | ICD-10-CM | POA: Diagnosis not present

## 2017-11-17 DIAGNOSIS — F321 Major depressive disorder, single episode, moderate: Secondary | ICD-10-CM | POA: Diagnosis not present

## 2017-11-20 DIAGNOSIS — M9901 Segmental and somatic dysfunction of cervical region: Secondary | ICD-10-CM | POA: Diagnosis not present

## 2017-11-20 DIAGNOSIS — M62830 Muscle spasm of back: Secondary | ICD-10-CM | POA: Diagnosis not present

## 2017-11-20 DIAGNOSIS — M5412 Radiculopathy, cervical region: Secondary | ICD-10-CM | POA: Diagnosis not present

## 2017-11-20 DIAGNOSIS — M9903 Segmental and somatic dysfunction of lumbar region: Secondary | ICD-10-CM | POA: Diagnosis not present

## 2017-11-24 DIAGNOSIS — M9901 Segmental and somatic dysfunction of cervical region: Secondary | ICD-10-CM | POA: Diagnosis not present

## 2017-11-24 DIAGNOSIS — M5412 Radiculopathy, cervical region: Secondary | ICD-10-CM | POA: Diagnosis not present

## 2017-11-24 DIAGNOSIS — M9903 Segmental and somatic dysfunction of lumbar region: Secondary | ICD-10-CM | POA: Diagnosis not present

## 2017-11-24 DIAGNOSIS — M62830 Muscle spasm of back: Secondary | ICD-10-CM | POA: Diagnosis not present

## 2017-11-25 DIAGNOSIS — M9903 Segmental and somatic dysfunction of lumbar region: Secondary | ICD-10-CM | POA: Diagnosis not present

## 2017-11-25 DIAGNOSIS — M5412 Radiculopathy, cervical region: Secondary | ICD-10-CM | POA: Diagnosis not present

## 2017-11-25 DIAGNOSIS — M62830 Muscle spasm of back: Secondary | ICD-10-CM | POA: Diagnosis not present

## 2017-11-25 DIAGNOSIS — M9901 Segmental and somatic dysfunction of cervical region: Secondary | ICD-10-CM | POA: Diagnosis not present

## 2017-11-27 DIAGNOSIS — M9901 Segmental and somatic dysfunction of cervical region: Secondary | ICD-10-CM | POA: Diagnosis not present

## 2017-11-27 DIAGNOSIS — M9903 Segmental and somatic dysfunction of lumbar region: Secondary | ICD-10-CM | POA: Diagnosis not present

## 2017-11-27 DIAGNOSIS — M62830 Muscle spasm of back: Secondary | ICD-10-CM | POA: Diagnosis not present

## 2017-11-27 DIAGNOSIS — M5412 Radiculopathy, cervical region: Secondary | ICD-10-CM | POA: Diagnosis not present

## 2017-12-03 DIAGNOSIS — M5412 Radiculopathy, cervical region: Secondary | ICD-10-CM | POA: Diagnosis not present

## 2017-12-03 DIAGNOSIS — M62830 Muscle spasm of back: Secondary | ICD-10-CM | POA: Diagnosis not present

## 2017-12-03 DIAGNOSIS — M9903 Segmental and somatic dysfunction of lumbar region: Secondary | ICD-10-CM | POA: Diagnosis not present

## 2017-12-03 DIAGNOSIS — M9901 Segmental and somatic dysfunction of cervical region: Secondary | ICD-10-CM | POA: Diagnosis not present

## 2017-12-04 DIAGNOSIS — J0141 Acute recurrent pansinusitis: Secondary | ICD-10-CM | POA: Diagnosis not present

## 2017-12-05 ENCOUNTER — Ambulatory Visit: Payer: Federal, State, Local not specified - PPO | Admitting: Nurse Practitioner

## 2017-12-05 ENCOUNTER — Encounter: Payer: Self-pay | Admitting: Nurse Practitioner

## 2018-01-26 DIAGNOSIS — F411 Generalized anxiety disorder: Secondary | ICD-10-CM | POA: Diagnosis not present

## 2018-01-26 DIAGNOSIS — F5105 Insomnia due to other mental disorder: Secondary | ICD-10-CM | POA: Diagnosis not present

## 2018-01-26 DIAGNOSIS — F321 Major depressive disorder, single episode, moderate: Secondary | ICD-10-CM | POA: Diagnosis not present

## 2018-02-03 ENCOUNTER — Encounter

## 2018-02-03 ENCOUNTER — Encounter: Payer: Self-pay | Admitting: Podiatry

## 2018-02-03 ENCOUNTER — Ambulatory Visit: Payer: Federal, State, Local not specified - PPO | Admitting: Podiatry

## 2018-02-03 DIAGNOSIS — B353 Tinea pedis: Secondary | ICD-10-CM | POA: Diagnosis not present

## 2018-02-03 MED ORDER — CLOTRIMAZOLE-BETAMETHASONE 1-0.05 % EX CREA: 1.0000 "application " | TOPICAL_CREAM | Freq: Two times a day (BID) | CUTANEOUS | 0 refills | Status: DC

## 2018-02-03 MED ORDER — TERBINAFINE HCL 250 MG PO TABS: 250.0000 mg | ORAL_TABLET | Freq: Every day | ORAL | 0 refills | Status: DC

## 2018-02-08 NOTE — Progress Notes (Signed)
   HPI: 42 year old female presenting today with a chief complaint of "deep" itching to the sub-first MPJ of the left foot that began 3-4 weeks ago. She reports associated mild redness. She has been applying Lamisil cream and Lotrimin cream with no significant relief. There are no modifying factors noted. Patient is here for further evaluation and treatment.   Past Medical History:  Diagnosis Date  . Arthritis   . Chronic fatigue syndrome   . GERD (gastroesophageal reflux disease)   . Headache    tension/cluster  . Hypothyroidism   . Motion sickness   . Plantar fasciitis of left foot   . PONV (postoperative nausea and vomiting)   . Thyroid disease   . Vaginal Pap smear, abnormal      Physical Exam: General: The patient is alert and oriented x3 in no acute distress.  Dermatology: Pruritus with hyperkeratosis noted to the left sub-first MPJ. Skin is warm, dry and supple bilateral lower extremities. Negative for open lesions or macerations.  Vascular: Palpable pedal pulses bilaterally. No edema or erythema noted. Capillary refill within normal limits.  Neurological: Epicritic and protective threshold grossly intact bilaterally.   Musculoskeletal Exam: Range of motion within normal limits to all pedal and ankle joints bilateral. Muscle strength 5/5 in all groups bilateral.   Assessment: 1. Tinea pedis left sub-first MPJ   Plan of Care:  1. Patient evaluated.  2. Prescription for Lotrisone cream provided to patient.  3. Prescription for Lamisil 250 mg #28 provided to patient.  4. Return to clinic in 4 weeks.     Edrick Kins, DPM [facility]  Dr. Edrick Kins, DPM    [address], [place] [address]                Office [phone]  Fax [phone]

## 2018-02-24 ENCOUNTER — Ambulatory Visit: Payer: Federal, State, Local not specified - PPO | Admitting: Podiatry

## 2018-02-24 ENCOUNTER — Encounter: Payer: Self-pay | Admitting: Podiatry

## 2018-02-24 DIAGNOSIS — L301 Dyshidrosis [pompholyx]: Secondary | ICD-10-CM | POA: Diagnosis not present

## 2018-02-24 DIAGNOSIS — L309 Dermatitis, unspecified: Secondary | ICD-10-CM

## 2018-02-24 MED ORDER — BETAMETHASONE DIPROPIONATE 0.05 % EX CREA: TOPICAL_CREAM | Freq: Two times a day (BID) | CUTANEOUS | 1 refills | Status: DC

## 2018-02-24 NOTE — Progress Notes (Signed)
   HPI: 42 year old female presenting today for follow up evaluation of itching of the left foot. She reports the symptoms have continued and feel as if they have gotten worse. She has been taking the Lamisil and using the Lotrisone with no significant relief. Patient is here for further evaluation and treatment.    Past Medical History:  Diagnosis Date  . Arthritis   . Chronic fatigue syndrome   . GERD (gastroesophageal reflux disease)   . Headache    tension/cluster  . Hypothyroidism   . Motion sickness   . Plantar fasciitis of left foot   . PONV (postoperative nausea and vomiting)   . Thyroid disease   . Vaginal Pap smear, abnormal      Physical Exam: General: The patient is alert and oriented x3 in no acute distress.  Dermatology: Pruritus with hyperkeratosis noted to the left sub-first MPJ. Skin is warm, dry and supple bilateral lower extremities. Negative for open lesions or macerations.  Vascular: Palpable pedal pulses bilaterally. No edema or erythema noted. Capillary refill within normal limits.  Neurological: Epicritic and protective threshold grossly intact bilaterally.   Musculoskeletal Exam: Range of motion within normal limits to all pedal and ankle joints bilateral. Muscle strength 5/5 in all groups bilateral.   Assessment: 1. Eczematous dermatitis left sub-first MPJ   Plan of Care:  1. Patient evaluated.  2. Prescription for Betamethasone 0.05% topical cream to be used twice daily.  3. Return to clinic as needed.      Edrick Kins, DPM [facility]  Dr. Edrick Kins, DPM    [address],  [address]                Office [phone]  Fax [phone]

## 2018-03-03 ENCOUNTER — Ambulatory Visit: Payer: Federal, State, Local not specified - PPO | Admitting: Podiatry

## 2018-03-06 ENCOUNTER — Ambulatory Visit: Payer: Federal, State, Local not specified - PPO | Admitting: Podiatry

## 2018-03-11 DIAGNOSIS — F321 Major depressive disorder, single episode, moderate: Secondary | ICD-10-CM | POA: Diagnosis not present

## 2018-03-11 DIAGNOSIS — F411 Generalized anxiety disorder: Secondary | ICD-10-CM | POA: Diagnosis not present

## 2018-03-11 DIAGNOSIS — F5105 Insomnia due to other mental disorder: Secondary | ICD-10-CM | POA: Diagnosis not present

## 2018-04-17 ENCOUNTER — Encounter: Payer: Self-pay | Admitting: Nurse Practitioner

## 2018-04-17 ENCOUNTER — Ambulatory Visit: Payer: Federal, State, Local not specified - PPO | Admitting: Nurse Practitioner

## 2018-04-17 VITALS — BP 116/72 | HR 96 | Temp 101.2°F | Resp 18 | Ht 66.0 in | Wt 205.6 lb

## 2018-04-17 DIAGNOSIS — R110 Nausea: Secondary | ICD-10-CM

## 2018-04-17 DIAGNOSIS — R6889 Other general symptoms and signs: Secondary | ICD-10-CM | POA: Diagnosis not present

## 2018-04-17 LAB — POCT INFLUENZA A/B
INFLUENZA A, POC: NEGATIVE
Influenza B, POC: NEGATIVE

## 2018-04-17 MED ORDER — ONDANSETRON 4 MG PO TBDP: 4.0000 mg | ORAL_TABLET | Freq: Three times a day (TID) | ORAL | 0 refills | Status: DC | PRN

## 2018-04-17 MED ORDER — OSELTAMIVIR PHOSPHATE 75 MG PO CAPS: 75.0000 mg | ORAL_CAPSULE | Freq: Two times a day (BID) | ORAL | 0 refills | Status: DC

## 2018-04-17 NOTE — Progress Notes (Signed)
Name: Courtney Alvarez   MRN: [id number]    DOB: 1976/01/02   Date:04/17/2018       Progress Note  Subjective  Chief Complaint  Chief Complaint  Patient presents with  . Influenza    HPI  States last night had fatigue, decreased appetite, fatigue and dry cough, woke up this morning had no energy, body aches, fevers & chills, sinus pressure and fullness, nausea. Lots of positive flu contacts at work. Shortness of breath with exertion.  Has tried tylenol and musinix DM   Patient Active Problem List   Diagnosis Date Noted  . Immune to varicella 06/01/2017  . Family history of colonic polyps   . Benign neoplasm of sigmoid colon   . Family history of colon cancer 07/31/2015  . Obesity 07/31/2015  . Loss of feeling or sensation 06/22/2015  . 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[place]) 12/16/2013  . Arthralgia of multiple joints 12/16/2013  . 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 Adult hypothyroidism 09/07/2013    Past Medical History:  Diagnosis Date  . Arthritis   . Chronic fatigue syndrome   . GERD (gastroesophageal reflux disease)   . Headache    tension/cluster  . Hypothyroidism   . Motion sickness   . Plantar fasciitis of left foot   . PONV (postoperative nausea and vomiting)   . Thyroid disease   . Vaginal Pap smear, abnormal     Past Surgical History:  Procedure Laterality Date  . BLADDER SURGERY    . CERVICAL BIOPSY  W/ LOOP ELECTRODE EXCISION    . COLONOSCOPY WITH PROPOFOL N/A 01/05/2016   Procedure: COLONOSCOPY WITH PROPOFOL;  Surgeon: Lucilla Lame, MD;  Location: [facility];  Service: Endoscopy;  Laterality: N/A;  . FOOT SURGERY Left   . POLYPECTOMY  01/05/2016   Procedure: POLYPECTOMY;  Surgeon: Lucilla Lame, MD;  Location: [facility];  Service: Endoscopy;;    Social History   Tobacco Use  . Smoking status: Former Smoker    Last attempt to quit: 06/14/2003    Years since  quitting: 14.8  . Smokeless tobacco: Never Used  . Tobacco comment: quit 06/2003  Substance Use Topics  . Alcohol use: Yes    Alcohol/week: 0.0 standard drinks    Comment: occasionally      Current Outpatient Medications:  .  calcium carbonate (TUMS - DOSED IN MG ELEMENTAL CALCIUM) 500 MG chewable tablet, Chew 1 tablet by mouth daily., Disp: , Rfl:  .  Cholecalciferol (PA VITAMIN D-3 GUMMY PO), Take 2 tablets by mouth daily. , Disp: , Rfl:  .  Crisaborole (EUCRISA) 2 % OINT, Apply TO AFFECTED AREA(S) OF RASH ON ARMS EVERY DAY AS NEEDED, Disp: , Rfl:  .  Multiple Vitamins-Minerals (MULTIVITAMIN GUMMIES WOMENS) CHEW, Sarina Ser 2 tablets by mouth daily. , Disp: , Rfl:  .  omeprazole (PRILOSEC) 20 MG capsule, Take 20 mg by mouth daily., Disp: , Rfl:  .  levonorgestrel (MIRENA) 20 MCG/24HR IUD, 1 each by Intrauterine route once., Disp: , Rfl:  .  temazepam (RESTORIL) 15 MG capsule, Take 15 mg by mouth as needed. , Disp: , Rfl:  .  terbinafine (LAMISIL) 250 MG tablet, Take 1 tablet (250 mg total) by mouth daily., Disp: 30 tablet, Rfl: 0 .  thyroid (ARMOUR) 120 MG tablet, Take 1 tablet (120 mg total) by mouth daily before breakfast. Six days a week, but one-half of a pill one day per week, Disp: ,  Rfl:  .  venlafaxine XR (EFFEXOR-XR) 150 MG 24 hr capsule, TK 1 C PO D, Disp: , Rfl: 1  Current Facility-Administered Medications:  .  betamethasone acetate-betamethasone sodium phosphate (CELESTONE) injection 3 mg, 3 mg, Intramuscular, Once, Evans, Brent M, DPM .  triamcinolone acetonide (KENALOG) 10 MG/ML injection 10 mg, 10 mg, Other, Once, Stover, Titorya, DPM  No Known Allergies  ROS   No other specific complaints in a complete review of systems (except as listed in HPI above).  Objective  Vitals:   04/17/18 1125  BP: 116/72  Pulse: (!) 106  Resp: 18  Temp: (!) 101.2 F (38.4 C)  TempSrc: Oral  SpO2: 98%  Weight: 205 lb 9.6 oz (93.3 kg)  Height: 5\' 6"  (1.676 m)    Body mass index is  33.18 kg/m.  Nursing Note and Vital Signs reviewed.  Physical Exam Constitutional:      Appearance: Normal appearance. She is well-developed.  HENT:     Head: Normocephalic and atraumatic.     Right Ear: Hearing, tympanic membrane, ear canal and external ear normal.     Left Ear: Hearing, tympanic membrane, ear canal and external ear normal.     Nose: Congestion and rhinorrhea present.     Right Sinus: Maxillary sinus tenderness and frontal sinus tenderness present.     Left Sinus: Maxillary sinus tenderness and frontal sinus tenderness present.     Mouth/Throat:     Mouth: Mucous membranes are moist.     Pharynx: Oropharynx is clear. Posterior oropharyngeal erythema present. No oropharyngeal exudate.  Eyes:     Conjunctiva/sclera: Conjunctivae normal.  Cardiovascular:     Rate and Rhythm: Normal rate and regular rhythm.     Heart sounds: Normal heart sounds.  Pulmonary:     Effort: Pulmonary effort is normal.     Breath sounds: Normal breath sounds. No wheezing or rhonchi.  Musculoskeletal: Normal range of motion.  Neurological:     Mental Status: She is alert and oriented to person, place, and time.  Psychiatric:        Speech: Speech normal.        Behavior: Behavior normal. Behavior is cooperative.        Thought Content: Thought content normal.        Judgment: Judgment normal.       No results found for this or any previous visit (from the past 48 hour(s)).  Assessment & Plan  1. Flu-like symptoms - POCT Influenza A/B - oseltamivir (TAMIFLU) 75 MG capsule; Take 1 capsule (75 mg total) by mouth 2 (two) times daily.  Dispense: 14 capsule; Refill: 0 - ondansetron (ZOFRAN-ODT) 4 MG disintegrating tablet; Take 1 tablet (4 mg total) by mouth every 8 (eight) hours as needed for nausea or vomiting.  Dispense: 20 tablet; Refill: 0  2. Nausea - ondansetron (ZOFRAN-ODT) 4 MG disintegrating tablet; Take 1 tablet (4 mg total) by mouth every 8 (eight) hours as needed for  nausea or vomiting.  Dispense: 20 tablet; Refill: 0   - Please take tamiflu twice daily - If fevers are not controlled, sinus pain worsens, or improves initially and then suddenly worsens please call to let us know. - If you develop shortness of breath, chest pain, uncontrolled fevers please get urgent medical attention _________ Drink plenty of fluids, rest. Cover your nose/mouth when you cough or sneeze and wash your hands well and often. Here are some helpful things you can use or pick up over the counter from the  pharmacy to help with your symptoms:   For Fever/Pain: Acetaminophen every 6 hours as needed (maximum of 3000mg  a day). If you are still uncomfortable you can add ibuprofen OR naproxen  For coughing: try dextromethorphan for a cough suppressant, and/or a cool mist humidifier, lozenges  For sore throat: saline gargles, honey herbal tea, lozenges, throat spray  To dry out your nose: try an antihistamine like loratadine (non-sedating) or diphenhydramine (sedating) or others To relieve a stuffy nose: try flonase, neti pot To make blowing your nose easier: guaifenesin

## 2018-04-17 NOTE — Patient Instructions (Addendum)
-   Please take tamiflu twice daily - If fevers are not controlled, sinus pain worsens, or improves initially and then suddenly worsens please call to let us know. - If you develop shortness of breath, chest pain, uncontrolled fevers please get urgent medical attention _________ Drink plenty of fluids, rest. Cover your nose/mouth when you cough or sneeze and wash your hands well and often. Here are some helpful things you can use or pick up over the counter from the pharmacy to help with your symptoms:   For Fever/Pain: Acetaminophen every 6 hours as needed (maximum of 3000mg  a day). If you are still uncomfortable you can add ibuprofen OR naproxen  For coughing: try dextromethorphan for a cough suppressant, and/or a cool mist humidifier, lozenges  For sore throat: saline gargles, honey herbal tea, lozenges, throat spray  To dry out your nose: try an antihistamine like loratadine (non-sedating) or diphenhydramine (sedating) or others To relieve a stuffy nose: try flonase, neti pot To make blowing your nose easier: guaifenesin

## 2018-04-23 DIAGNOSIS — F321 Major depressive disorder, single episode, moderate: Secondary | ICD-10-CM | POA: Diagnosis not present

## 2018-04-23 DIAGNOSIS — F5105 Insomnia due to other mental disorder: Secondary | ICD-10-CM | POA: Diagnosis not present

## 2018-04-23 DIAGNOSIS — F411 Generalized anxiety disorder: Secondary | ICD-10-CM | POA: Diagnosis not present

## 2018-05-12 ENCOUNTER — Ambulatory Visit: Payer: Federal, State, Local not specified - PPO | Admitting: Family Medicine

## 2018-05-12 ENCOUNTER — Encounter: Payer: Self-pay | Admitting: Family Medicine

## 2018-05-12 ENCOUNTER — Other Ambulatory Visit: Payer: Self-pay | Admitting: Family Medicine

## 2018-05-12 VITALS — BP 120/68 | HR 85 | Temp 98.4°F | Resp 14 | Ht 66.0 in | Wt 201.8 lb

## 2018-05-12 DIAGNOSIS — R5383 Other fatigue: Secondary | ICD-10-CM

## 2018-05-12 DIAGNOSIS — R590 Localized enlarged lymph nodes: Secondary | ICD-10-CM | POA: Diagnosis not present

## 2018-05-12 DIAGNOSIS — R05 Cough: Secondary | ICD-10-CM

## 2018-05-12 DIAGNOSIS — J029 Acute pharyngitis, unspecified: Secondary | ICD-10-CM | POA: Diagnosis not present

## 2018-05-12 DIAGNOSIS — R059 Cough, unspecified: Secondary | ICD-10-CM

## 2018-05-12 LAB — POCT RAPID STREP A (OFFICE): Rapid Strep A Screen: NEGATIVE

## 2018-05-12 LAB — POCT INFLUENZA A/B
INFLUENZA B, POC: NEGATIVE
Influenza A, POC: NEGATIVE

## 2018-05-12 NOTE — Progress Notes (Signed)
BP 120/68   Pulse 85   Temp 98.4 F (36.9 C) (Oral)   Resp 14   Ht 5\' 6"  (1.676 m)   Wt 201 lb 12.8 oz (91.5 kg)   SpO2 95%   BMI 32.57 kg/m    Subjective:    Patient ID: Courtney Alvarez, female    DOB: 1975-06-26, 43 y.o.   MRN: [id number]  HPI: Courtney Alvarez is a 43 y.o. female  Chief Complaint  Patient presents with  . URI    onset 5 days symptoms include, congestion, sore throat, fatigue and cough    HPI Patient is here for an acute visit Having sx of upper respiratory infection She has had some sore throat Coughing with congestion Fatigue No fevers No rash No travel  Computer crashed: Patient is here for an acute visit She saw Suezanne Cheshire, DNP on Jan 3rd Then she took Tamiflu and immediately got better Then last Friday, got sick again; severe sore throat, didn't go to work on Saturday, just felt terrible; coming and going Voice is really raspy, she has lost her voice, just a whisper on Sunday morning; voice a little better today Coughing, bring up tan colored mucous up; it's coming on its own; first thing in the morning, breaking up Taking sudafed No fever, no travel No rash Really fatigued; no hx of mono; had some swollen lymph nodes on Friday  Depression screen [facility] 2/9 05/12/2018 10/02/2017 11/11/2016 07/05/2016 07/31/2015  Decreased Interest 0 0 0 0 0  Down, Depressed, Hopeless 0 0 0 0 0  PHQ - 2 Score 0 0 0 0 0  Altered sleeping 0 0 - - -  Tired, decreased energy 0 1 - - -  Change in appetite 0 0 - - -  Feeling bad or failure about yourself  0 0 - - -  Trouble concentrating 0 0 - - -  Moving slowly or fidgety/restless 0 0 - - -  Suicidal thoughts 0 0 - - -  PHQ-9 Score 0 1 - - -  Difficult doing work/chores Not difficult at all Not difficult at all - - -   Fall Risk  05/12/2018 04/17/2018 10/02/2017 11/11/2016 07/05/2016  Falls in the past year? 0 0 No No No  Number falls in past yr: 0 0 - - -  Injury with Fall? 0 0 - - -    Relevant past medical,  surgical, family and social history reviewed Past Medical History:  Diagnosis Date  . Arthritis   . Chronic fatigue syndrome   . GERD (gastroesophageal reflux disease)   . Headache    tension/cluster  . Hypothyroidism   . Motion sickness   . Plantar fasciitis of left foot   . PONV (postoperative nausea and vomiting)   . Thyroid disease   . Vaginal Pap smear, abnormal    Past Surgical History:  Procedure Laterality Date  . BLADDER SURGERY    . CERVICAL BIOPSY  W/ LOOP ELECTRODE EXCISION    . COLONOSCOPY WITH PROPOFOL N/A 01/05/2016   Procedure: COLONOSCOPY WITH PROPOFOL;  Surgeon: Lucilla Lame, MD;  Location: [place];  Service: Endoscopy;  Laterality: N/A;  . FOOT SURGERY Left   . POLYPECTOMY  01/05/2016   Procedure: POLYPECTOMY;  Surgeon: Lucilla Lame, MD;  Location: [place];  Service: Endoscopy;;   Family History  Problem Relation Age of Onset  . Arthritis Mother   . COPD Mother   . Thyroid disease Mother   . Arthritis  Father   . Hyperlipidemia Father   . Cancer Father        polyp removed  . Heart disease Father   . Hashimoto's thyroiditis Sister   . Arthritis Maternal Grandmother   . Cancer Maternal Grandmother        colon  . Heart disease Maternal Grandmother   . Thyroid disease Maternal Grandmother   . Alcohol abuse Maternal Grandfather   . Arthritis Maternal Grandfather   . Arthritis Paternal Grandmother   . Depression Paternal Grandmother   . Hypertension Paternal Grandmother   . Stroke Paternal Grandmother   . Arthritis Paternal Grandfather   . Heart disease Paternal Grandfather   . Hypertension Paternal Grandfather   . Breast cancer Other    Social History   Tobacco Use  . Smoking status: Former Smoker    Last attempt to quit: 06/14/2003    Years since quitting: 14.9  . Smokeless tobacco: Never Used  . Tobacco comment: quit 06/2003  Substance Use Topics  . Alcohol use: Yes    Alcohol/week: 0.0 standard drinks    Comment:  occasionally   . Drug use: No     Office Visit from 05/12/2018 in [facility]  AUDIT-C Score  1      Interim medical history since last visit reviewed. Allergies and medications reviewed  Review of Systems Per HPI unless specifically indicated above     Objective:    BP 120/68   Pulse 85   Temp 98.4 F (36.9 C) (Oral)   Resp 14   Ht 5\' 6"  (1.676 m)   Wt 201 lb 12.8 oz (91.5 kg)   SpO2 95%   BMI 32.57 kg/m   Wt Readings from Last 3 Encounters:  05/13/18 201 lb 12.8 oz (91.5 kg)  04/17/18 205 lb 9.6 oz (93.3 kg)  10/02/17 208 lb 8 oz (94.6 kg)    Physical Exam Constitutional:      Appearance: She is well-developed.  HENT:     Nose: Mucosal edema and rhinorrhea present.     Mouth/Throat:     Pharynx: Posterior oropharyngeal erythema (mild) present. No oropharyngeal exudate.  Eyes:     General: No scleral icterus. Cardiovascular:     Rate and Rhythm: Normal rate and regular rhythm.  Pulmonary:     Effort: Pulmonary effort is normal.     Breath sounds: Normal breath sounds.  Lymphadenopathy:     Cervical: No cervical adenopathy.  Psychiatric:        Behavior: Behavior normal.     Results for orders placed or performed in visit on 05/12/18  Culture, Group A Strep  Result Value Ref Range   MICRO NUMBER: [id number]    SPECIMEN QUALITY: Adequate    SOURCE: THROAT    STATUS: FINAL    RESULT: No group A Streptococcus isolated   POCT Influenza A/B  Result Value Ref Range   Influenza A, POC Negative Negative   Influenza B, POC Negative Negative  POCT rapid strep A  Result Value Ref Range   Rapid Strep A Screen Negative Negative      Assessment & Plan:   Problem List Items Addressed This Visit    None    Visit Diagnoses    Sore throat    -  Primary   rapid strep negative, culture pending; symptomatic care   Relevant Orders   Culture, Group A Strep (Completed)   POCT rapid strep A (Completed)   Cough  symptomatic care, rest,  hydration, call if worsening   Relevant Orders   POCT Influenza A/B (Completed)   Other fatigue       call i fnot improving, consider checking mono, cmv   Relevant Orders   POCT Influenza A/B (Completed)       Follow up plan: No follow-ups on file.  An after-visit summary was printed and given to the patient at [place].  Please see the patient instructions which may contain other information and recommendations beyond what is mentioned above in the assessment and plan.  No orders of the defined types were placed in this encounter.   Orders Placed This Encounter  Procedures  . Culture, Group A Strep  . POCT Influenza A/B  . POCT rapid strep A

## 2018-05-14 LAB — CULTURE, GROUP A STREP
MICRO NUMBER:: 117049
SPECIMEN QUALITY:: ADEQUATE

## 2018-05-15 LAB — EPSTEIN-BARR VIRUS VCA ANTIBODY PANEL
EBV NA IgG: 336 U/mL — ABNORMAL HIGH
EBV VCA IgG: 406 U/mL — ABNORMAL HIGH
EBV VCA IgM: <36 U/mL

## 2018-05-15 LAB — CBC WITH DIFFERENTIAL/PLATELET
Absolute Monocytes: 724 cells/uL (ref 200–950)
BASOS PCT: 0.8 %
Basophils Absolute: 62 cells/uL (ref 0–200)
Eosinophils Absolute: 231 cells/uL (ref 15–500)
Eosinophils Relative: 3 %
HCT: 39.4 % (ref 35.0–45.0)
Hemoglobin: 13.1 g/dL (ref 11.7–15.5)
Lymphs Abs: 2279 cells/uL (ref 850–3900)
MCH: 28.5 pg (ref 27.0–33.0)
MCHC: 33.2 g/dL (ref 32.0–36.0)
MCV: 85.7 fL (ref 80.0–100.0)
MPV: 10.7 fL (ref 7.5–12.5)
Monocytes Relative: 9.4 %
Neutro Abs: 4404 cells/uL (ref 1500–7800)
Neutrophils Relative %: 57.2 %
Platelets: 279 10*3/uL (ref 140–400)
RBC: 4.6 10*6/uL (ref 3.80–5.10)
RDW: 12.5 % (ref 11.0–15.0)
Total Lymphocyte: 29.6 %
WBC: 7.7 10*3/uL (ref 3.8–10.8)

## 2018-05-15 LAB — COMPLETE METABOLIC PANEL WITH GFR
AG Ratio: 1.3 (calc) (ref 1.0–2.5)
ALT: 19 U/L (ref 6–29)
AST: 19 U/L (ref 10–30)
Albumin: 3.9 g/dL (ref 3.6–5.1)
Alkaline phosphatase (APISO): 61 U/L (ref 33–115)
BUN: 18 mg/dL (ref 7–25)
CHLORIDE: 105 mmol/L (ref 98–110)
CO2: 28 mmol/L (ref 20–32)
Calcium: 8.9 mg/dL (ref 8.6–10.2)
Creat: 0.8 mg/dL (ref 0.50–1.10)
GFR, Est African American: 105 mL/min/{1.73_m2} (ref >=60)
GFR, Est Non African American: 90 mL/min/{1.73_m2} (ref >=60)
Globulin: 2.9 g/dL (calc) (ref 1.9–3.7)
Glucose, Bld: 100 mg/dL — ABNORMAL HIGH (ref 65–99)
Potassium: 4.6 mmol/L (ref 3.5–5.3)
Sodium: 140 mmol/L (ref 135–146)
Total Bilirubin: 0.2 mg/dL (ref 0.2–1.2)
Total Protein: 6.8 g/dL (ref 6.1–8.1)

## 2018-05-15 LAB — CMV ABS, IGG+IGM (CYTOMEGALOVIRUS)
CMV IgM: <30 AU/mL
Cytomegalovirus Ab-IgG: 1.8 U/mL — ABNORMAL HIGH

## 2018-05-21 DIAGNOSIS — F5105 Insomnia due to other mental disorder: Secondary | ICD-10-CM | POA: Diagnosis not present

## 2018-05-21 DIAGNOSIS — F321 Major depressive disorder, single episode, moderate: Secondary | ICD-10-CM | POA: Diagnosis not present

## 2018-05-21 DIAGNOSIS — F411 Generalized anxiety disorder: Secondary | ICD-10-CM | POA: Diagnosis not present

## 2018-05-26 ENCOUNTER — Other Ambulatory Visit: Payer: Self-pay | Admitting: Physician Assistant

## 2018-05-26 ENCOUNTER — Telehealth: Payer: Federal, State, Local not specified - PPO | Admitting: Physician Assistant

## 2018-05-26 DIAGNOSIS — B9689 Other specified bacterial agents as the cause of diseases classified elsewhere: Secondary | ICD-10-CM | POA: Diagnosis not present

## 2018-05-26 DIAGNOSIS — J019 Acute sinusitis, unspecified: Secondary | ICD-10-CM | POA: Diagnosis not present

## 2018-05-26 MED ORDER — AMOXICILLIN-POT CLAVULANATE 875-125 MG PO TABS: 1.0000 | ORAL_TABLET | Freq: Two times a day (BID) | ORAL | 0 refills | Status: AC

## 2018-05-26 NOTE — Progress Notes (Signed)
We are sorry that you are not feeling well.  Here is how we plan to help!  Based on what you have shared with me it looks like you have sinusitis.  Sinusitis is inflammation and infection in the sinus cavities of the head.  Based on your presentation I believe you most likely have Acute Bacterial Sinusitis.  This is an infection caused by bacteria and is treated with antibiotics. I have prescribed Augmentin 875mg /125mg  one tablet twice daily with food, for 7 days. You may use an oral decongestant such as Mucinex D or if you have glaucoma or high blood pressure use plain Mucinex. Saline nasal spray help and can safely be used as often as needed for congestion.  If you develop worsening sinus pain, fever or notice severe headache and vision changes, or if symptoms are not better after completion of antibiotic, please schedule an appointment with a health care provider.    Sinus infections are not as easily transmitted as other respiratory infection, however we still recommend that you avoid close contact with loved ones, especially the very young and elderly.  Remember to wash your hands thoroughly throughout the day as this is the number one way to prevent the spread of infection!  Home Care: Only take medications as instructed by your medical team. Complete the entire course of an antibiotic. Do not take these medications with alcohol. A steam or ultrasonic humidifier can help congestion.  You can place a towel over your head and breathe in the steam from hot water coming from a faucet. Avoid close contacts especially the very young and the elderly. Cover your mouth when you cough or sneeze. Always remember to wash your hands.  Get Help Right Away If: You develop worsening fever or sinus pain. You develop a severe head ache or visual changes. Your symptoms persist after you have completed your treatment plan.  Make sure you Understand these instructions. Will watch your condition. Will get  help right away if you are not doing well or get worse.  Your e-visit answers were reviewed by a board certified advanced clinical practitioner to complete your personal care plan.  Depending on the condition, your plan could have included both over the counter or prescription medications.  If there is a problem please reply once you have received a response from your provider.  Your safety is important to [place].  If you have drug allergies check your prescription carefully.    You can use MyChart to ask questions about today's visit, request a non-urgent call back, or ask for a work or school excuse for 24 hours related to this e-Visit. If it has been greater than 24 hours you will need to follow up with your provider, or enter a new e-Visit to address those concerns.  You will get an e-mail in the next two days asking about your experience.  I hope that your e-visit has been valuable and will speed your recovery. Thank you for using e-visits.    ===View-only below this line===   ----- Message -----    From: [name]    Sent: 05/26/2018  8:28 AM EST      To: E-Visit Mailing List Subject: E-Visit Submission: Sinus Problems  E-Visit Submission: Sinus Problems --------------------------------  Question: Which of the following have you been experiencing? Answer:   Pain around the nose and face            Headache  Ear pain            Cough  Question: Have these symptoms significantly worsened over the last two to three days? Answer:   Yes  Question: Have you had any of the following? Answer:   None of the above  Question: How long have you been having these symptoms? Answer:   7 or more days  Question: Do you have a fever? Answer:   No, I do not have a fever  Question: Do you smoke? Answer:   No  Question: Have you ever smoked? Answer:   I smoked in the past, but quit  Question: Do you have any chronic illnesses, such as diabetes, heart disease, kidney  disease, or lung disease, or any illness that would weaken your body's ability to fight infection? Answer:   No  Question: When you blow your nose, what color is the mucus? Answer:   Mostly clear  Question: Have you experienced similar problems in the past? Answer:   Yes  Question: What treatments have worked in the past?  Answer:   Antibiotics  Question: What treatment(s) in the past have been unsuccessful? Answer:     Question: Is this illness similar to previous illnesses you have had?  How is it the same?  How is it different? Answer:   Yes, congestion, sinus pressure, fatigue, nausea, dizziness.  Question: Have you recently been hospitalized? Answer:   No  Question: What medications are you currently taking for these symptoms? Answer:   Decongestants            Pain medicine  Question: Please enter the names of any medications you are taking, or any other treatments you are trying. Answer:   Phenylephrine, Mucinex DM, Tylenol  Question: Are you pregnant? Answer:   I am confident that I am not pregnant  Question: Are you breastfeeding? Answer:   No  Question: Please list your medication allergies that you may have ? (If 'none' , please list as 'none') Answer:   NKDA  Question: Please list any additional comments  Answer:   I haven't felt completely better since coming in a few weeks ago.  A total of 5-10 minutes was spent evaluating this patients questionnaire and formulating a plan of care.

## 2018-05-26 NOTE — Progress Notes (Signed)
Pt called and went to the pharmacy and the medication was not called or sent in/ please send in medication or advise

## 2018-06-09 DIAGNOSIS — F321 Major depressive disorder, single episode, moderate: Secondary | ICD-10-CM | POA: Diagnosis not present

## 2018-06-14 DIAGNOSIS — J069 Acute upper respiratory infection, unspecified: Secondary | ICD-10-CM | POA: Diagnosis not present

## 2018-07-20 DIAGNOSIS — F5105 Insomnia due to other mental disorder: Secondary | ICD-10-CM | POA: Diagnosis not present

## 2018-07-20 DIAGNOSIS — F321 Major depressive disorder, single episode, moderate: Secondary | ICD-10-CM | POA: Diagnosis not present

## 2018-07-20 DIAGNOSIS — F411 Generalized anxiety disorder: Secondary | ICD-10-CM | POA: Diagnosis not present

## 2018-08-10 ENCOUNTER — Telehealth: Payer: Self-pay | Admitting: Family Medicine

## 2018-08-10 MED ORDER — THYROID 120 MG PO TABS: 120.0000 mg | ORAL_TABLET | Freq: Every day | ORAL | 0 refills | Status: DC

## 2018-08-10 NOTE — Telephone Encounter (Signed)
Please call patient to come in for follow up in the next 2 weeks - can be over video conference, but will need to come in for labs afterwards.

## 2018-08-10 NOTE — Telephone Encounter (Signed)
Refill request for thyroid medication: Armour Thyroid 120 mg  Last Visit: 05/12/2018   Lab Results  Component Value Date   TSH 0.28 (L) 09/01/2017    Follow-ups on file. None indicated

## 2018-08-10 NOTE — Telephone Encounter (Signed)
appt scheduled

## 2018-08-17 ENCOUNTER — Other Ambulatory Visit: Payer: Self-pay

## 2018-08-17 ENCOUNTER — Ambulatory Visit: Payer: Self-pay | Admitting: Physician Assistant

## 2018-08-17 ENCOUNTER — Encounter: Payer: Self-pay | Admitting: Physician Assistant

## 2018-08-17 ENCOUNTER — Telehealth: Payer: Federal, State, Local not specified - PPO | Admitting: Physician Assistant

## 2018-08-17 VITALS — BP 118/80 | HR 74 | Temp 98.6°F | Resp 16 | Wt 203.0 lb

## 2018-08-17 DIAGNOSIS — H5789 Other specified disorders of eye and adnexa: Secondary | ICD-10-CM

## 2018-08-17 DIAGNOSIS — J3489 Other specified disorders of nose and nasal sinuses: Secondary | ICD-10-CM

## 2018-08-17 DIAGNOSIS — H5712 Ocular pain, left eye: Secondary | ICD-10-CM

## 2018-08-17 DIAGNOSIS — J3089 Other allergic rhinitis: Secondary | ICD-10-CM

## 2018-08-17 MED ORDER — ERYTHROMYCIN 5 MG/GM OP OINT: 1.0000 "application " | TOPICAL_OINTMENT | Freq: Three times a day (TID) | OPHTHALMIC | 0 refills | Status: AC

## 2018-08-17 NOTE — Progress Notes (Signed)
Patient ID: Courtney Alvarez DOB: 02-07-1976 AGE: 43 y.o. MRN: [id number]   PCP: Arnetha Courser, MD   Chief Complaint:  Chief Complaint  Patient presents with  . Eye Pain    x4d     Subjective:    HPI:  Courtney Alvarez is a 43 y.o. female presents for evaluation  Chief Complaint  Patient presents with  . Eye Pain    x9d    43 year old female presents to [facility] with four day history of left eye irritation. Located on portion of eye inferior to upper eyelid. Began as dryness sensation. Patient using Systane drops (has been using for years for treatment of chronic dry eye) - no improvement. Has since developed eye ache/discomfort and scratchy/gritty sensation. Patient with frequent tearing of left eye. Associated frontal sinus pressure/congestion/headache, nasal congestion, and maxillary sinus pressure/congestion. Has been taking OTC antihistamine, decongestant (Sudafed), and using Flonase nasal spray. Denies known eye injury/trauma. Patient with seasonal allergies and frequent episodes of sinusitis (most recent February 2020, treated with Augmentin) - no previous similar associated eye pain. Patient denies any right eye symptoms. Patient wears corrective lenses, glasses. Does not wear contact lenses. Patient is near sighted. Denies fever, chills, sweats, dizziness/lightheadedness, pain with eye movement, change in vision, foreign body sensation, nausea/vomiting, ear pain, sore throat, cough, SOB, wheezing, chest pain.  Patient with hypothyroidism. Last thyroid panel in May 2019; TSH slightly low, T3 and T4 normal. Patient with no history of hypertension.  A limited review of symptoms was performed, pertinent positives and negatives as mentioned in HPI.  The following portions of the patient's history were reviewed and updated as appropriate: allergies, current medications and past medical history.  Patient Active Problem List   Diagnosis Date Noted  . Immune to varicella  06/01/2017  . Family history of colonic polyps   . Benign neoplasm of sigmoid colon   . Family history of colon cancer 07/31/2015  . Obesity 07/31/2015  . Loss of feeling or sensation 06/22/2015  . 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[place]) 12/16/2013  . Arthralgia of multiple joints 12/16/2013  . 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 Adult hypothyroidism 09/07/2013    No Known Allergies  Current Outpatient Medications on File Prior to Visit  Medication Sig Dispense Refill  . buPROPion (WELLBUTRIN SR) 100 MG 12 hr tablet Take 100 mg by mouth daily.    . calcium carbonate (TUMS - DOSED IN MG ELEMENTAL CALCIUM) 500 MG chewable tablet Chew 1 tablet by mouth daily.    . Cholecalciferol (PA VITAMIN D-3 GUMMY PO) Take 2 tablets by mouth daily.     Stasia Cavalier (EUCRISA) 2 % OINT Apply TO AFFECTED AREA(S) OF RASH ON ARMS EVERY DAY AS NEEDED    . lansoprazole (PREVACID) 15 MG capsule Take 15 mg by mouth daily at 12 noon.    Marland Kitchen levonorgestrel (MIRENA) 20 MCG/24HR IUD 1 each by Intrauterine route once.    . Multiple Vitamins-Minerals (MULTIVITAMIN GUMMIES WOMENS) CHEW Chew 2 tablets by mouth daily.     . temazepam (RESTORIL) 15 MG capsule Take 15 mg by mouth as needed.     . thyroid (ARMOUR) 120 MG tablet Take 1 tablet (120 mg total) by mouth daily before breakfast. Six days a week, but one-half of a pill one day per week 15 tablet 0  . venlafaxine XR (EFFEXOR-XR) 150 MG 24 hr capsule TK 1 C PO D  1  .  omeprazole (PRILOSEC) 20 MG capsule Take 20 mg by mouth daily.    . ondansetron (ZOFRAN-ODT) 4 MG disintegrating tablet Take 1 tablet (4 mg total) by mouth every 8 (eight) hours as needed for nausea or vomiting. 20 tablet 0   No current facility-administered medications on file prior to visit.        Objective:   Vitals:   08/17/18 1237  BP: 118/80  Pulse: 74  Resp: 16  Temp: 98.6 F (37 C)  SpO2: 98%     Wt Readings  from Last 3 Encounters:  08/17/18 203 lb (92.1 kg)  05/13/18 201 lb 12.8 oz (91.5 kg)  04/17/18 205 lb 9.6 oz (93.3 kg)    Physical Exam:  Left eye 20/30 Right eye 20/30 Both eyes 20/20 Without correction  General Appearance:  Patient sitting comfortably on examination table. Conversational. Kermit Balo self-historian. In no acute distress. Afebrile.   Head:  Normocephalic, without obvious abnormality, atraumatic. No rash on face. Patient points to area between eyebrows and pressure/congestion/headache.  Eyes:  PERRL; left pupil not fixed and not dilated. Minimal photophobia; equal left and right eye. Bulbar conjunctiva clear (no erythema, injection, perlimbal flushing, or chemosis). No cobblestoning visible on palpebral conjunctiva. Corneas clear; tetracaine and flourecein stain placed in left eye. No uptake seen with blue light/Wood's lamp. No ulcer, abrasion, herpetic lesions. EOM's intact; no pain with eye movement. No tenderness with palpation over left eyeball, periorbital area. No erythema of upper/lower eyelids or periorbital area. No edema. Eversion of left upper eyelid reveals no visible foreign body.  Ears:  Left ear canal WNL. No erythema or edema. No open wound. No visible purulent drainage. No tenderness with palpation over left tragus or with manipulation of left auricle. No visible erythema or edema of left mastoid. No tenderness with palpation over left mastoid. Right ear canal WNL. No erythema or edema. No open wound. No visible purulent drainage. No tenderness with palpation over right tragus or with manipulation of right auricle. No visible erythema or edema of right mastoid. No tenderness with palpation over right mastoid. Left TM WNL. Good light reflex. Visible landmarks. No erythema. No injection. No bulging or retraction. No visible perforation. No serous effusion. No visible purulent effusion. No tympanostomy tube. No scar tissue. Right TM WNL. Good light reflex. Visible  landmarks. No erythema. No injection. No bulging or retraction. No visible perforation. No serous effusion. No visible purulent effusion. No tympanostomy tube. No scar tissue.  Nose: Nares normal. Septum midline. No visible polyps. No discharge. Normal mucosa. Subjective pressure/congestion/pain at frontal and bilateral maxillary sinuses. Minimal tenderness with palpation.  Throat: Lips, mucosa, and tongue normal; teeth and gums normal. Throat reveals no erythema. No postnasal drip. No visible cobblestoning. Tonsils with no enlargement or exudate. Uvula midline with no edema or erythema.  Neck: Supple, symmetrical, trachea midline, no adenopathy  Lungs:   Clear to auscultation bilaterally, respirations unlabored. Good aeration. No rales, rhonchi, crackles or wheezing.  Heart:  Regular rate and rhythm, S1 and S2 normal, no murmur, rub, or gallop  Extremities: Extremities normal, atraumatic, no cyanosis or edema  Pulses: 2+ and symmetric  Skin: Skin color, texture, turgor normal, no rashes or lesions  Lymph nodes: Cervical, supraclavicular, and axillary nodes normal  Neurologic: Normal    Assessment & Plan:    Exam findings, diagnosis etiology and medication use and indications reviewed with patient. Follow-Up and discharge instructions provided. No emergent/urgent issues found on exam.  Patient education was provided.   Patient verbalized  understanding of information provided and agrees with plan of care (POC), all questions answered. The patient is advised to call or return to clinic if condition does not see an improvement in symptoms, or to seek the care of the closest emergency department if condition worsens with the below plan.    1. Irritation of left eye - erythromycin ophthalmic ointment; Place 1 application into the left eye 3 (three) times daily for 7 days.  Dispense: 21 g; Refill: 0  2. Sinus pressure  3. Environmental and seasonal allergies  Patient with four day history of  left eye discomfort. No known injury/trauma. Associated sinus pressure/congestion. VSS, afebrile, in no acute distress. No abnormality seen on physical exam (including with flourecein staining); no change in vision (patient states only change from when eye tearing). Subjective photophobia. Concern due to severity of pain/discomfort (pressure/ache). Patient with chronic dry eye; but denies any previous evaluation for Srogren's syndrome (denies history of dry mouth) - autoimmune disorder would make patient more likely to have keratitis or uveitis or optic neuritis. Differential includes acute frontal/maxillary sinusitis, herpes zoster prior to rash/eruption, temporal arteritis (though, no visible bulging temporal artery), acute angle closure glaucoma, migraine, etc.  For very short symptom relief, prescribed Erythromycin eye ointment. Will help with lubrication (patient states eye drops in office provided significant comfort/cooling sensation). Possible symptoms simply due to worsening dry eye; may be exacerbated by antihistamine. Patient may benefit from   Advised patient follow-up with eye doctor today or tomorrow for further evaluation and management. Patient should have worrisome causes of eye pain ruled out. Patient agreed with plan. Provided number for [facility]; will see patients same day. Patient may benefit from steroid eye drops, muscarinic agonist such as Restasis or pilocarpine, or other treatment modalities.   Darlin Priestly, MHS, PA-C Montey Hora, MHS, PA-C Advanced Practice Provider [facility]  [place], [place] [address] (p): [phone] [email or web address]

## 2018-08-17 NOTE — Progress Notes (Signed)
Based on what you shared with me, I feel your condition warrants further evaluation and I recommend that you be seen for a face to face office visit.  Courtney Alvarez,  Your symptoms warrant a face to face evaluation to check for foreign body in the eye, and to see if there is possible scratch.     NOTE: If you entered your credit card information for this eVisit, you will not be charged. You may see a "hold" on your card for the $35 but that hold will drop off and you will not have a charge processed.  If you are having a true medical emergency please call 911.  If you need an urgent face to face visit, [facility] has four urgent care centers for your convenience.    PLEASE NOTE: THE [facility] LOCATIONS AND URGENT CARE CLINICS DO NOT HAVE THE TESTING FOR CORONAVIRUS COVID19 AVAILABLE.  IF YOU FEEL YOU NEED THIS TEST YOU MUST GO TO A TRIAGE LOCATION AT [place] [facility]   [email or web address] to reserve your spot online an avoid wait times  [facility] [address], [address] [place], [place] [address] Modified hours of operation: Monday-Friday, 12 PM to 6 PM  Saturday & Sunday 10 AM to 4 PM *Across the street from [place] (New Address!) [address] [place], [place] [address] *Just off [address], across the road from [place] hours of operation: Monday-Friday, 12 PM to 6 PM  Closed Saturday & Sunday  [facility]'s modified hours of operation will be in effect from May 1 until May 31   The following sites will take your insurance:  . [facility] [place] a Provider at this Location  [address] [place], [place] [address] . 10 am to 8 pm Monday-Friday . 12 pm to 8 pm Saturday-Sunday   . [facility] [place] a Provider at this Location  [place]  [address], [place], [place] [address] . 8 am to 8 pm Monday-Friday . 9 am to 6 pm Saturday . 11 am to 6 pm Sunday   . [facility] at [place] Get Driving Directions  [address] [place], [place] [address] . 8 am to 8 pm Monday-Friday . 8 am to 4 pm Saturday-Sunday   Your e-visit answers were reviewed by a board certified advanced clinical practitioner to complete your personal care plan.  Thank you for using e-Visits. I have spent 7 min in completion and review of this note- Lacy Duverney [facility]

## 2018-08-17 NOTE — Patient Instructions (Addendum)
Thank you for choosing [facility] for your health care needs.  You have been diagnosed with eye irritation. Concern for amount of eye pain/discomfort. Recommend you follow-up with an eye doctor today or tomorrow for further evaluation.   One option is [facility]. Address: [address] [place], [place] [address] Open Closes 5PM Phone: [phone]  May continue to use Systane eye drops. May wish to discontinue/stop using antihistamine such as Claritin, Zyrtec, or Allegra.  Use prescription eye ointment: Meds ordered this encounter  Medications  . erythromycin ophthalmic ointment    Sig: Place 1 application into the left eye 3 (three) times daily for 7 days.    Dispense:  21 g    Refill:  0    Order Specific Question:   Supervising Provider    Answer:   Noemi Chapel [[id number]]   May apply cool compress over eye. May take over the counter ibuprofen for eye pain/discomfort.  Recommend in regards to sinus symptoms; increase fluids, continue to use Flonase, may use saline nasal spray, may continue to use decongestant such as Sudafed.  May call [facility], follow-up with urgent care, or follow-up with family practice provider in a few days if sinus pressure/congestion not improving.  Hope you feel better soon!   How to Use Eye Drops and Eye Ointments Your health care provider may prescribe or recommend eye drops or ointments for a variety of reasons, such as to help relieve symptoms like redness, dryness, and itchiness. You may also need to use eye drops or ointments to treat an eye infection or before or after surgery on your eye. You should use eye drops and ointments only as told by your health care provider. You may need to have a caregiver or family member help you place eye drops or ointment in your eye. How to use eye drops Follow these steps when putting eye drops in your eye: 1. Wash your hands with soap and water. 2. Follow any instructions for mixing or shaking eye  drops prior to using them. 3. Stand in front of a mirror so that you can see your eye well. 4. Place one finger under your eye and use it to gently pull your lower lid downward. This forms a small pocket to place the drop. Keep that finger in place. 5. Using your other hand, hold the dropper between your thumb and index finger. 6. Position the dropper just above the edge of your lower eyelid. Do not touch the dropper to your lid or your eyeball. 7. Steady your hand. One way to do this is to lean your index finger against your brow. 8. Look up slightly. 9. Slowly and gently squeeze one drop of medicine into your eye near the lower lid. 10. Gently close your eye. 11. Place a finger between your lower eyelid and your nose. Press gently for 2 minutes. This increases the amount of time that the medicine is exposed to the eye and can help prevent certain side effects. 12. Do not rub your eye. How to apply eye ointments Follow these steps when applying eye ointments: 1. Wash your hands with soap and water. 2. Stand in front of a mirror so that you can see your eye well. 3. Place one finger under your eye and use it to gently pull your lower lid downward. Keep that finger in place. 4. Using your other hand, hold the tip of the tube between your thumb and index finger. Brace your other fingers against your cheek  or nose. 5. Hold the tube just above the edge of your lower eyelid. Do not touch the tube to your lid or your eyeball. 6. Line the inner part of your lower lid with ointment. 7. Let go of your lower lid. 8. Gently pull up on your upper lid and look down. This will spread the ointment over the surface of your eye. 9. Let go of the upper lid. 10. Gently close your eyes. If you can, leave them closed for 1-2 minutes. 11. Do not rub your eyes. If you applied the ointment correctly, your vision will be blurry for a few minutes. This is normal. General tips  Make sure you use the eye drops or  ointment only as told by your health care provider.  Ask for help from a caregiver or family member if you are unable to apply the eye drops or ointment.  If you have been told to use both eye drops and an eye ointment, apply the eye drops first, then wait 3-4 minutes before you apply the ointment.  Try not to touch the tip of the dropper or tube to your eye. A dropper or tube that has touched the eye can get germs on it (get contaminated). Summary  Your health care provider may prescribe or recommend eye drops or ointments to relieve symptoms like redness, dryness, and itchiness.  Be sure to wash your hands with soap and water before applying eye drops or ointment.  You may need to have a caregiver or family member help you place eye drops or ointment in your eye.  Make sure you use the eye drops or ointment only as told by your health care provider. This information is not intended to replace advice given to you by your health care provider. Make sure you discuss any questions you have with your health care provider. Document Released: 07/08/2000 Document Revised: 04/03/2017 Document Reviewed: 04/03/2017 Elsevier Interactive Patient Education  2019 [employer].

## 2018-08-19 ENCOUNTER — Telehealth: Payer: Self-pay | Admitting: Emergency Medicine

## 2018-08-19 DIAGNOSIS — H04123 Dry eye syndrome of bilateral lacrimal glands: Secondary | ICD-10-CM | POA: Diagnosis not present

## 2018-08-19 NOTE — Telephone Encounter (Signed)
Patient contacted back and stated that she is doing so much better. Still some dryness but she did schedule appointment with [place] care today just to make sure everything is ok.

## 2018-08-23 ENCOUNTER — Encounter: Payer: Self-pay | Admitting: Family Medicine

## 2018-08-24 ENCOUNTER — Telehealth: Payer: Self-pay | Admitting: Family Medicine

## 2018-08-24 NOTE — Telephone Encounter (Signed)
Copied from [place] [id number]. Topic: Appointment Scheduling - Scheduling Inquiry for Clinic >> Aug 24, 2018 12:24 PM Vernona Rieger wrote: Reason for CRM: Patient would like to know if she needs to get her labs done for her TSH before her virtual appt on Wednesday with Raquel Sarna. Patient states she always has labs done prior to appt to see if her medication needs to be adjusted for her thyroid

## 2018-08-25 NOTE — Telephone Encounter (Signed)
Can do labs after appointment not sure if Raquel Sarna will order any other labs

## 2018-08-25 NOTE — Telephone Encounter (Signed)
Pt informed

## 2018-08-26 ENCOUNTER — Other Ambulatory Visit: Payer: Self-pay

## 2018-08-26 ENCOUNTER — Encounter: Payer: Self-pay | Admitting: Family Medicine

## 2018-08-26 ENCOUNTER — Ambulatory Visit (INDEPENDENT_AMBULATORY_CARE_PROVIDER_SITE_OTHER): Payer: Federal, State, Local not specified - PPO | Admitting: Family Medicine

## 2018-08-26 DIAGNOSIS — R5382 Chronic fatigue, unspecified: Secondary | ICD-10-CM

## 2018-08-26 DIAGNOSIS — D8989 Other specified disorders involving the immune mechanism, not elsewhere classified: Secondary | ICD-10-CM

## 2018-08-26 DIAGNOSIS — Z13220 Encounter for screening for lipoid disorders: Secondary | ICD-10-CM

## 2018-08-26 DIAGNOSIS — Z6833 Body mass index (BMI) 33.0-33.9, adult: Secondary | ICD-10-CM

## 2018-08-26 DIAGNOSIS — E039 Hypothyroidism, unspecified: Secondary | ICD-10-CM | POA: Diagnosis not present

## 2018-08-26 DIAGNOSIS — M2550 Pain in unspecified joint: Secondary | ICD-10-CM | POA: Diagnosis not present

## 2018-08-26 DIAGNOSIS — H04123 Dry eye syndrome of bilateral lacrimal glands: Secondary | ICD-10-CM | POA: Insufficient documentation

## 2018-08-26 DIAGNOSIS — G9332 Myalgic encephalomyelitis/chronic fatigue syndrome: Secondary | ICD-10-CM

## 2018-08-26 DIAGNOSIS — E042 Nontoxic multinodular goiter: Secondary | ICD-10-CM

## 2018-08-26 DIAGNOSIS — E6609 Other obesity due to excess calories: Secondary | ICD-10-CM

## 2018-08-26 DIAGNOSIS — F411 Generalized anxiety disorder: Secondary | ICD-10-CM | POA: Insufficient documentation

## 2018-08-26 DIAGNOSIS — F331 Major depressive disorder, recurrent, moderate: Secondary | ICD-10-CM

## 2018-08-26 DIAGNOSIS — K219 Gastro-esophageal reflux disease without esophagitis: Secondary | ICD-10-CM | POA: Insufficient documentation

## 2018-08-26 MED ORDER — FAMOTIDINE 40 MG PO TABS: ORAL_TABLET | ORAL | 1 refills | Status: DC

## 2018-08-26 NOTE — Patient Instructions (Addendum)
Alternate Prevacid and Famotidine every other day for 2 weeks, then stop prevacid daily - just take as needed - and start taking famotidine daily.  To help with reflux: - Elevate your head of bed - either with a few extra pillows, a wedge pillow, or by placing two bed risers under the head of bed posts. - Avoid the following foods: citrus, fatty foods, chocolate, peppermint, and excessive alcohol, along with sodas, orange juice (acidic drinks) - Stop eating at least 3 hours before going to bed, minimize naps/laying down after eating. - No smoking. - If you are overweight or obese, exercising and losing weight will also help your symptoms. - Caution: prolonged use of proton pump inhibitors like omeprazole (Prilosec), pantoprazole (Protonix), esomeprazole (Nexium), and others like Dexilant and Aciphex may increase your risk of pneumonia, Clostridium difficile colitis, osteoporosis, anemia and other health complications

## 2018-08-26 NOTE — Progress Notes (Signed)
Name: Courtney Alvarez   MRN: [id number]    DOB: Feb 11, 1976   Date:08/26/2018       Progress Note  Subjective  Chief Complaint  Chief Complaint  Patient presents with  . Follow-up  . Medication Refill    Dr.Paul bot at [facility] and need thyroid refills    I connected with  Ayah D Herzberg  on 08/26/18 at 11:00 AM EDT by a video enabled telemedicine application and verified that I am speaking with the correct person using two identifiers.  I discussed the limitations of evaluation and management by telemedicine and the availability of in person appointments. The patient expressed understanding and agreed to proceed. Staff also discussed with the patient that there may be a patient responsible charge related to this service. Patient Location: Home Provider Location: Home Additional Individuals present: None  HPI  CFIDS/Multijoint Arthralgia: Fatigue has been improving - switching to Armour and current dose has worked for her fatigue. Her pain is still present - is seeing a chiropractor and this is helping quite a bit.  She did go to [facility] for cervical spin steroid injection which helped temporarily.  Seeing Dr. Jonni Sanger with [facility].  Takes tylenol PRN.  No recent infections.  Depression/Anxiety: Seeing Dr. Nicolasa Ducking.  Given Restoril for sleep - she did wean herself off of daily use of this and takes Melatonin instead, restoril PRN. Also taking Wellbutrin and Effexor.  Feels that these medications are working well for her; minimal irritability. Noticing symptom improvement.  She is going to counseling with [facility].   Hypothyroidism with multinodular goiter: Having every 63mos to q29mos [place] - was told at last visit there was no reason to continue repeated US.  Review of most recent U sfrom 2018 does not mention any recommendation for ongoing monitoring as the nodule was stable and thyroid gland was mildly enlarged.  She denies constipation/diarrhea, palpitations, heat/cold intolerance,  hair/skin/nail changes.    Obesity: She is exercising - has slowed down recently with this - likes to run and lift weights.  She was working out at a gym but it closed prior to COVID-19.   Her diet was really good well, but she has been slacking lately.   GERD: Taking prevacid OTC and it seems to work well for her.  Discussed risk of long-term PPI therapy.  She notes triggers include stress.  She would like to come down and off of prevacid/take it PRN. We will switch her to pepcid.  Denies dysphagia, no blood in stool, abdominal pain.   Dry eyes: [facility], then went to [facility] for evaluation - was told that her oil glands are not producing properly.  She was told to try Systane balance. Also completed an erythromycin drops which seemed to help. No vision changes.  Recommend humidifier at night.  Not taking antihistamine right now.  Recommend wearing glasses at work to protect from dry air in the hospital.   Patient Active Problem List   Diagnosis Date Noted  . Immune to varicella 06/01/2017  . Family history of colonic polyps   . Benign neoplasm of sigmoid colon   . Family history of colon cancer 07/31/2015  . Obesity 07/31/2015  . Loss of feeling or sensation 06/22/2015  . 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[place]) 12/16/2013  . Arthralgia of multiple joints 12/16/2013  . 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 Adult hypothyroidism 09/07/2013    Past  Surgical History:  Procedure Laterality Date  . BLADDER SURGERY    . CERVICAL BIOPSY  W/ LOOP ELECTRODE EXCISION    . COLONOSCOPY WITH PROPOFOL N/A 01/05/2016   Procedure: COLONOSCOPY WITH PROPOFOL;  Surgeon: Lucilla Lame, MD;  Location: [facility];  Service: Endoscopy;  Laterality: N/A;  . FOOT SURGERY Left   . POLYPECTOMY  01/05/2016   Procedure: POLYPECTOMY;  Surgeon: Lucilla Lame, MD;  Location: [facility];  Service: Endoscopy;;     Family History  Problem Relation Age of Onset  . Arthritis Mother   . COPD Mother   . Thyroid disease Mother   . Arthritis Father   . Hyperlipidemia Father   . Cancer Father        polyp removed  . Heart disease Father   . Hashimoto's thyroiditis Sister   . Arthritis Maternal Grandmother   . Cancer Maternal Grandmother        colon  . Heart disease Maternal Grandmother   . Thyroid disease Maternal Grandmother   . Alcohol abuse Maternal Grandfather   . Arthritis Maternal Grandfather   . Arthritis Paternal Grandmother   . Depression Paternal Grandmother   . Hypertension Paternal Grandmother   . Stroke Paternal Grandmother   . Arthritis Paternal Grandfather   . Heart disease Paternal Grandfather   . Hypertension Paternal Grandfather   . Breast cancer Other     Social History   Socioeconomic History  . Marital status: Married    Spouse name: Annie Main  . Number of children: 1  . Years of education: Not on file  . Highest education level: Associate degree: occupational, Hotel manager, or vocational program  Occupational History  . Occupation: Therapist, sports  Social Needs  . Financial resource strain: Not hard at all  . Food insecurity:    Worry: Never true    Inability: Never true  . Transportation needs:    Medical: No    Non-medical: No  Tobacco Use  . Smoking status: Former Smoker    Last attempt to quit: 06/14/2003    Years since quitting: 15.2  . Smokeless tobacco: Never Used  . Tobacco comment: quit 06/2003  Substance and Sexual Activity  . Alcohol use: Yes    Alcohol/week: 0.0 standard drinks    Comment: occasionally   . Drug use: No  . Sexual activity: Yes    Partners: Male    Birth control/protection: I.U.D.    Comment: mirena  Lifestyle  . Physical activity:    Days per week: 3 days    Minutes per session: 60 min  . Stress: Not at all  Relationships  . Social connections:    Talks on phone: More than three times a week    Gets together: Twice a week    Attends  religious service: More than 4 times per year    Active member of club or organization: Yes    Attends meetings of clubs or organizations: More than 4 times per year    Relationship status: Married  . Intimate partner violence:    Fear of current or ex partner: No    Emotionally abused: No    Physically abused: No    Forced sexual activity: No  Other Topics Concern  . Not on file  Social History Narrative  . Not on file     Current Outpatient Medications:  .  buPROPion (WELLBUTRIN SR) 100 MG 12 hr tablet, Take 100 mg by mouth daily., Disp: , Rfl:  .  calcium carbonate (  TUMS - DOSED IN MG ELEMENTAL CALCIUM) 500 MG chewable tablet, Chew 1 tablet by mouth daily., Disp: , Rfl:  .  Cholecalciferol (PA VITAMIN D-3 GUMMY PO), Take 2 tablets by mouth daily. , Disp: , Rfl:  .  Crisaborole (EUCRISA) 2 % OINT, Apply TO AFFECTED AREA(S) OF RASH ON ARMS EVERY DAY AS NEEDED, Disp: , Rfl:  .  lansoprazole (PREVACID) 15 MG capsule, Take 15 mg by mouth daily at 12 noon., Disp: , Rfl:  .  levonorgestrel (MIRENA) 20 MCG/24HR IUD, 1 each by Intrauterine route once., Disp: , Rfl:  .  Multiple Vitamins-Minerals (MULTIVITAMIN GUMMIES WOMENS) CHEW, Sarina Ser 2 tablets by mouth daily. , Disp: , Rfl:  .  omeprazole (PRILOSEC) 20 MG capsule, Take 20 mg by mouth daily., Disp: , Rfl:  .  ondansetron (ZOFRAN-ODT) 4 MG disintegrating tablet, Take 1 tablet (4 mg total) by mouth every 8 (eight) hours as needed for nausea or vomiting., Disp: 20 tablet, Rfl: 0 .  temazepam (RESTORIL) 15 MG capsule, Take 15 mg by mouth as needed. , Disp: , Rfl:  .  thyroid (ARMOUR) 120 MG tablet, Take 1 tablet (120 mg total) by mouth daily before breakfast. Six days a week, but one-half of a pill one day per week, Disp: 15 tablet, Rfl: 0 .  venlafaxine XR (EFFEXOR-XR) 150 MG 24 hr capsule, TK 1 C PO D, Disp: , Rfl: 1  No Known Allergies  I personally reviewed active problem list, medication list, allergies, health maintenance, notes from  last encounter, lab results with the patient/caregiver today.   ROS Constitutional: Negative for fever or weight change.  Respiratory: Negative for cough and shortness of breath.   Cardiovascular: Negative for chest pain or palpitations.  Gastrointestinal: Negative for abdominal pain, no bowel changes.  Musculoskeletal: Negative for gait problem or joint swelling.  Skin: Negative for rash.  Neurological: Negative for dizziness or headache.  No other specific complaints in a complete review of systems (except as listed in HPI above).  Objective  Virtual encounter, vitals not obtained.  There is no height or weight on file to calculate BMI.  Physical Exam Constitutional: Patient appears well-developed and well-nourished. No distress.  HENT: Head: Normocephalic and atraumatic.  Conjunctiva WNL. Neck: Normal range of motion. Pulmonary/Chest: Effort normal. No respiratory distress. Speaking in complete sentences Neurological: Pt is alert and oriented to person, place, and time. Coordination, speech and gait are normal.  Psychiatric: Patient has a normal mood and affect. behavior is normal. Judgment and thought content normal.  No results found for this or any previous visit (from the past 72 hour(s)).  PHQ2/9: Depression screen [facility] 2/9 08/26/2018 05/12/2018 10/02/2017 11/11/2016 07/05/2016  Decreased Interest 0 0 0 0 0  Down, Depressed, Hopeless 0 0 0 0 0  PHQ - 2 Score 0 0 0 0 0  Altered sleeping 0 0 0 - -  Tired, decreased energy 0 0 1 - -  Change in appetite 0 0 0 - -  Feeling bad or failure about yourself  0 0 0 - -  Trouble concentrating 0 0 0 - -  Moving slowly or fidgety/restless 0 0 0 - -  Suicidal thoughts 0 0 0 - -  PHQ-9 Score 0 0 1 - -  Difficult doing work/chores Not difficult at all Not difficult at all Not difficult at all - -   PHQ-2/9 Result is negative.    Fall Risk: Fall Risk  08/26/2018 05/12/2018 04/17/2018 10/02/2017 11/11/2016  Falls in the past  year? 0 0 0 No  No  Number falls in past yr: 0 0 0 - -  Injury with Fall? 0 0 0 - -  Follow up Falls evaluation completed - - - -    Assessment & Plan  1. 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[facility]) - Stable, no recent infections, taking Tylenol as needed, fatigue has improved.  2. Arthralgia of multiple joints Taking Tylenol as needed.  No major issues today.  3. Adult hypothyroidism We will provide refill of Armour thyroid after thyroid level is drawn. - TSH  4. Goiter, nontoxic, multinodular No routine surveillance of goiter is recommended at this time per most recent thyroid ultrasound - TSH - Lipid panel  5. GAD (generalized anxiety disorder) Doing well.  Seeing Dr. Nicolasa Ducking.  6. Moderate episode of recurrent major depressive disorder ([place]) Doing well.  Seeing Dr. Nicolasa Ducking  7. Gastroesophageal reflux disease without esophagitis -We will try to wean her off of daily PPI therapy.  She will alternate days with famotidine and Prevacid for 2 weeks, then stop Prevacid and increase famotidine to daily dosing.  She may continue Prevacid on a as needed basis.  Discussed avoiding triggers, and managing stress./ - famotidine (PEPCID) 40 MG tablet; Take every other day for 2 weeks, then increase to once daily.  Dispense: 90 tablet; Refill: 1  8. Class 1 obesity due to excess calories without serious comorbidity with body mass index (BMI) of 33.0 to 33.9 in adult Discussed importance of 150 minutes of physical activity weekly, eat two servings of fish weekly, eat one serving of tree nuts ( cashews, pistachios, pecans, almonds.Marland Kitchen) every other day, eat 6 servings of fruit/vegetables daily and drink plenty of water and avoid sweet beverages.  - TSH - Lipid panel  9. Lipid screening - Lipid panel  10. Dry eyes -Maintain follow-up with [facility].  Suggested humidification in her home, and wearing goggles or glasses at work as this may help prevent some of the very dry air in the hospital  setting from directly contacting her eyes.  I discussed the assessment and treatment plan with the patient. The patient was provided an opportunity to ask questions and all were answered. The patient agreed with the plan and demonstrated an understanding of the instructions.  The patient was advised to call back or seek an in-person evaluation if the symptoms worsen or if the condition fails to improve as anticipated.  I provided 33 minutes of non-face-to-face time during this encounter.

## 2018-08-27 DIAGNOSIS — E6609 Other obesity due to excess calories: Secondary | ICD-10-CM | POA: Diagnosis not present

## 2018-08-27 DIAGNOSIS — Z6833 Body mass index (BMI) 33.0-33.9, adult: Secondary | ICD-10-CM | POA: Diagnosis not present

## 2018-08-27 DIAGNOSIS — E039 Hypothyroidism, unspecified: Secondary | ICD-10-CM | POA: Diagnosis not present

## 2018-08-27 DIAGNOSIS — E042 Nontoxic multinodular goiter: Secondary | ICD-10-CM | POA: Diagnosis not present

## 2018-08-27 LAB — LIPID PANEL
Cholesterol: 208 mg/dL — ABNORMAL HIGH (ref <200)
HDL: 73 mg/dL (ref >=50)
LDL Cholesterol (Calc): 118 mg/dL (calc) — ABNORMAL HIGH
Non-HDL Cholesterol (Calc): 135 mg/dL (calc) — ABNORMAL HIGH (ref <130)
Total CHOL/HDL Ratio: 2.8 (calc) (ref <5.0)
Triglycerides: 80 mg/dL (ref <150)

## 2018-08-27 LAB — TSH: TSH: 0.24 mIU/L — ABNORMAL LOW

## 2018-08-28 ENCOUNTER — Other Ambulatory Visit: Payer: Self-pay | Admitting: Family Medicine

## 2018-08-28 DIAGNOSIS — E039 Hypothyroidism, unspecified: Secondary | ICD-10-CM

## 2018-09-01 ENCOUNTER — Encounter: Payer: Self-pay | Admitting: Family Medicine

## 2018-09-01 DIAGNOSIS — E039 Hypothyroidism, unspecified: Secondary | ICD-10-CM

## 2018-09-01 MED ORDER — THYROID 120 MG PO TABS: 120.0000 mg | ORAL_TABLET | Freq: Every day | ORAL | 0 refills | Status: DC

## 2018-10-02 DIAGNOSIS — F321 Major depressive disorder, single episode, moderate: Secondary | ICD-10-CM | POA: Diagnosis not present

## 2018-10-12 ENCOUNTER — Telehealth: Payer: Self-pay | Admitting: Family Medicine

## 2018-10-12 ENCOUNTER — Encounter: Payer: Self-pay | Admitting: Family Medicine

## 2018-10-12 DIAGNOSIS — K219 Gastro-esophageal reflux disease without esophagitis: Secondary | ICD-10-CM

## 2018-10-12 NOTE — Telephone Encounter (Signed)
Left message for patient

## 2018-10-12 NOTE — Telephone Encounter (Signed)
-----   Message from Hubbard Hartshorn, [place] sent at 08/28/2018  9:25 AM EDT ----- Regarding: Call patient to come in for TSH recheck Call patient to come in for TSH recheck

## 2018-10-13 MED ORDER — FAMOTIDINE 20 MG PO TABS: 20.0000 mg | ORAL_TABLET | Freq: Two times a day (BID) | ORAL | 1 refills | Status: DC

## 2018-10-22 DIAGNOSIS — F321 Major depressive disorder, single episode, moderate: Secondary | ICD-10-CM | POA: Diagnosis not present

## 2018-10-22 DIAGNOSIS — F5105 Insomnia due to other mental disorder: Secondary | ICD-10-CM | POA: Diagnosis not present

## 2018-10-22 DIAGNOSIS — F411 Generalized anxiety disorder: Secondary | ICD-10-CM | POA: Diagnosis not present

## 2018-11-09 DIAGNOSIS — F411 Generalized anxiety disorder: Secondary | ICD-10-CM | POA: Diagnosis not present

## 2018-11-09 DIAGNOSIS — F5105 Insomnia due to other mental disorder: Secondary | ICD-10-CM | POA: Diagnosis not present

## 2018-11-09 DIAGNOSIS — F321 Major depressive disorder, single episode, moderate: Secondary | ICD-10-CM | POA: Diagnosis not present

## 2018-12-08 ENCOUNTER — Other Ambulatory Visit: Payer: Self-pay | Admitting: Family Medicine

## 2018-12-08 DIAGNOSIS — E039 Hypothyroidism, unspecified: Secondary | ICD-10-CM

## 2018-12-14 ENCOUNTER — Other Ambulatory Visit: Payer: Self-pay | Admitting: Family Medicine

## 2018-12-14 DIAGNOSIS — Z1231 Encounter for screening mammogram for malignant neoplasm of breast: Secondary | ICD-10-CM

## 2019-01-06 DIAGNOSIS — F411 Generalized anxiety disorder: Secondary | ICD-10-CM | POA: Diagnosis not present

## 2019-01-06 DIAGNOSIS — F321 Major depressive disorder, single episode, moderate: Secondary | ICD-10-CM | POA: Diagnosis not present

## 2019-01-06 DIAGNOSIS — F5105 Insomnia due to other mental disorder: Secondary | ICD-10-CM | POA: Diagnosis not present

## 2019-01-20 ENCOUNTER — Ambulatory Visit
Admission: RE | Admit: 2019-01-20 | Discharge: 2019-01-20 | Disposition: A | Discharge status: Home / Self Care | Payer: Federal, State, Local not specified - PPO | Source: Ambulatory Visit | Attending: Family Medicine | Admitting: Family Medicine

## 2019-01-20 DIAGNOSIS — Z1231 Encounter for screening mammogram for malignant neoplasm of breast: Secondary | ICD-10-CM | POA: Diagnosis not present

## 2019-01-22 ENCOUNTER — Encounter: Payer: Self-pay | Admitting: Family Medicine

## 2019-02-22 DIAGNOSIS — Z20828 Contact with and (suspected) exposure to other viral communicable diseases: Secondary | ICD-10-CM | POA: Diagnosis not present

## 2019-02-24 ENCOUNTER — Other Ambulatory Visit: Payer: Self-pay

## 2019-02-24 ENCOUNTER — Encounter: Payer: Self-pay | Admitting: Family Medicine

## 2019-02-24 ENCOUNTER — Ambulatory Visit (INDEPENDENT_AMBULATORY_CARE_PROVIDER_SITE_OTHER): Payer: Federal, State, Local not specified - PPO | Admitting: Family Medicine

## 2019-02-24 DIAGNOSIS — J0110 Acute frontal sinusitis, unspecified: Secondary | ICD-10-CM | POA: Diagnosis not present

## 2019-02-24 MED ORDER — AMOXICILLIN-POT CLAVULANATE 875-125 MG PO TABS: 1.0000 | ORAL_TABLET | Freq: Two times a day (BID) | ORAL | 0 refills | Status: AC

## 2019-02-24 NOTE — Progress Notes (Signed)
Name: Courtney Alvarez   MRN: [id number]    DOB: 09/13/1975   Date:02/24/2019       Progress Note  Subjective  Chief Complaint  Chief Complaint  Patient presents with  . Covid Exposure  . Sinusitis  . Otalgia    I connected with  Demetri D Hanks  on 02/24/19 at  8:00 AM EST by a video enabled telemedicine application and verified that I am speaking with the correct person using two identifiers.  I discussed the limitations of evaluation and management by telemedicine and the availability of in person appointments. The patient expressed understanding and agreed to proceed. Staff also discussed with the patient that there may be a patient responsible charge related to this service. Patient Location: Home Provider Location: Office Additional Individuals present: None  HPI  PT presents with concern for 9 day history of sinus congestion, eat pain/pressure, significant frontal/sinus headache.  She endorses frontal sinus pain and pressure; she notes some tonsillar swelling/discomfort and right sided lymphadenopathy in the neck.  She notes that 5 days ago she did have contact with a COVID+ patient, however this person had been positive for 21 days.  She is awaiting results at this time on COVID symptoms.  She denies fevers/chills, shortness of breath, changes in taste/smell, NVD.  She is staying quarantined until her test result is returned.  She is taking decongestant; not taking nasal spray or antihistamine.   Patient Active Problem List   Diagnosis Date Noted  . Gastroesophageal reflux disease without esophagitis 08/26/2018  . Moderate episode of recurrent major depressive disorder ([place]) 08/26/2018  . GAD (generalized anxiety disorder) 08/26/2018  . Dry eyes 08/26/2018  . Immune to varicella 06/01/2017  . Family history of colonic polyps   . Benign neoplasm of sigmoid colon   . Family history of colon cancer 07/31/2015  . Obesity 07/31/2015  . Loss of feeling or sensation 06/22/2015  .  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[place]) 12/16/2013  . Arthralgia of multiple joints 12/16/2013  . 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 Adult hypothyroidism 09/07/2013    Social History   Tobacco Use  . Smoking status: Former Smoker    Quit date: 06/14/2003    Years since quitting: 15.7  . Smokeless tobacco: Never Used  . Tobacco comment: quit 06/2003  Substance Use Topics  . Alcohol use: Yes    Alcohol/week: 0.0 standard drinks    Comment: occasionally      Current Outpatient Medications:  .  calcium carbonate (TUMS - DOSED IN MG ELEMENTAL CALCIUM) 500 MG chewable tablet, Chew 1 tablet by mouth daily., Disp: , Rfl:  .  Cholecalciferol (PA VITAMIN D-3 GUMMY PO), Take 2 tablets by mouth daily. , Disp: , Rfl:  .  Crisaborole (EUCRISA) 2 % OINT, Apply TO AFFECTED AREA(S) OF RASH ON ARMS EVERY DAY AS NEEDED, Disp: , Rfl:  .  FLUoxetine (PROZAC) 20 MG capsule, Take 20 mg by mouth every morning., Disp: , Rfl:  .  lansoprazole (PREVACID) 15 MG capsule, Take 15 mg by mouth daily at 12 noon., Disp: , Rfl:  .  levonorgestrel (MIRENA) 20 MCG/24HR IUD, 1 each by Intrauterine route once., Disp: , Rfl:  .  Multiple Vitamins-Minerals (MULTIVITAMIN GUMMIES WOMENS) CHEW, [place] Ser 2 tablets by mouth daily. , Disp: , Rfl:  .  NP THYROID 120 MG tablet, TAKE 1 TABLET BY MOUTH DAILY BEFORE BREAKFAST. 5 DAYS A WEEK, BUT 1/2 TABLET  2 DAYS PER WEEK, Disp: 90 tablet, Rfl: 0 .  ondansetron (ZOFRAN-ODT) 4 MG disintegrating tablet, Take 1 tablet (4 mg total) by mouth every 8 (eight) hours as needed for nausea or vomiting., Disp: 20 tablet, Rfl: 0 .  venlafaxine XR (EFFEXOR-XR) 150 MG 24 hr capsule, TK 1 C PO D, Disp: , Rfl: 1 .  buPROPion (WELLBUTRIN SR) 100 MG 12 hr tablet, Take 100 mg by mouth daily., Disp: , Rfl:  .  famotidine (PEPCID) 20 MG tablet, Take 1 tablet (20 mg total) by mouth 2 (two) times daily. (Patient not taking: Reported on  02/24/2019), Disp: 180 tablet, Rfl: 1 .  temazepam (RESTORIL) 15 MG capsule, Take 15 mg by mouth as needed. , Disp: , Rfl:   No Known Allergies  I personally reviewed active problem list, medication list, allergies, notes from last encounter, lab results with the patient/caregiver today.  ROS  Ten systems reviewed and is negative except as mentioned in HPI  Objective  Virtual encounter, vitals not obtained.  There is no height or weight on file to calculate BMI.  Nursing Note and Vital Signs reviewed.  Physical Exam  Constitutional: Patient appears well-developed and well-nourished. No distress.  HENT: Head: Normocephalic and atraumatic.  Neck: Normal range of motion. Pulmonary/Chest: Effort normal. No respiratory distress. Speaking in complete sentences Neurological: Pt is alert and oriented to person, place, and time. Coordination, speech are normal.  Psychiatric: Patient has a normal mood and affect. behavior is normal. Judgment and thought content normal.  No results found for this or any previous visit (from the past 72 hour(s)).  Assessment & Plan  1. Acute non-recurrent frontal sinusitis - Declines nasal spray; will use decongestants sparingly only as needed; ibuprofen or tylenol PRN for pain.  Will stay well hydrated.  Will notify us if COVID-19 testing is positive. - amoxicillin-clavulanate (AUGMENTIN) 875-125 MG tablet; Take 1 tablet by mouth 2 (two) times daily for 10 days.  Dispense: 20 tablet; Refill: 0  -Red flags and when to present for emergency care or RTC including fever >101.46F, chest pain, shortness of breath, new/worsening/un-resolving symptoms, reviewed with patient at time of visit. Follow up and care instructions discussed and provided in AVS. - I discussed the assessment and treatment plan with the patient. The patient was provided an opportunity to ask questions and all were answered. The patient agreed with the plan and demonstrated an understanding of  the instructions.  I provided 10 minutes of non-face-to-face time during this encounter.  Hubbard Hartshorn, FNP

## 2019-02-28 ENCOUNTER — Encounter: Payer: Self-pay | Admitting: Family Medicine

## 2019-02-28 ENCOUNTER — Other Ambulatory Visit: Payer: Self-pay | Admitting: Family Medicine

## 2019-02-28 DIAGNOSIS — E039 Hypothyroidism, unspecified: Secondary | ICD-10-CM

## 2019-03-03 ENCOUNTER — Other Ambulatory Visit: Payer: Self-pay | Admitting: Emergency Medicine

## 2019-03-03 ENCOUNTER — Other Ambulatory Visit: Payer: Self-pay | Admitting: Family Medicine

## 2019-03-03 DIAGNOSIS — E039 Hypothyroidism, unspecified: Secondary | ICD-10-CM

## 2019-03-04 LAB — TSH: TSH: 0.43 mIU/L

## 2019-04-05 DIAGNOSIS — F411 Generalized anxiety disorder: Secondary | ICD-10-CM | POA: Diagnosis not present

## 2019-04-05 DIAGNOSIS — F5105 Insomnia due to other mental disorder: Secondary | ICD-10-CM | POA: Diagnosis not present

## 2019-04-05 DIAGNOSIS — F321 Major depressive disorder, single episode, moderate: Secondary | ICD-10-CM | POA: Diagnosis not present

## 2019-04-08 ENCOUNTER — Encounter: Payer: Self-pay | Admitting: Family Medicine

## 2019-04-21 DIAGNOSIS — Z20828 Contact with and (suspected) exposure to other viral communicable diseases: Secondary | ICD-10-CM | POA: Diagnosis not present

## 2019-04-26 ENCOUNTER — Encounter: Payer: Self-pay | Admitting: Family Medicine

## 2019-04-26 ENCOUNTER — Telehealth: Payer: Federal, State, Local not specified - PPO | Admitting: Physician Assistant

## 2019-04-26 DIAGNOSIS — J329 Chronic sinusitis, unspecified: Secondary | ICD-10-CM

## 2019-04-26 MED ORDER — AMOXICILLIN-POT CLAVULANATE 875-125 MG PO TABS: 1.0000 | ORAL_TABLET | Freq: Two times a day (BID) | ORAL | 0 refills | Status: DC

## 2019-04-26 NOTE — Progress Notes (Signed)

## 2019-05-24 DIAGNOSIS — J0100 Acute maxillary sinusitis, unspecified: Secondary | ICD-10-CM | POA: Diagnosis not present

## 2019-06-15 DIAGNOSIS — F321 Major depressive disorder, single episode, moderate: Secondary | ICD-10-CM | POA: Diagnosis not present

## 2019-06-15 DIAGNOSIS — F411 Generalized anxiety disorder: Secondary | ICD-10-CM | POA: Diagnosis not present

## 2019-06-15 DIAGNOSIS — F5105 Insomnia due to other mental disorder: Secondary | ICD-10-CM | POA: Diagnosis not present

## 2019-06-16 ENCOUNTER — Other Ambulatory Visit: Payer: Self-pay | Admitting: Family Medicine

## 2019-06-16 DIAGNOSIS — E039 Hypothyroidism, unspecified: Secondary | ICD-10-CM

## 2019-06-17 MED ORDER — THYROID 120 MG PO TABS: 120.0000 mg | ORAL_TABLET | Freq: Every day | ORAL | 0 refills | Status: DC

## 2019-06-17 NOTE — Telephone Encounter (Signed)
Thyroid medicine refilled  Last TSH noted.

## 2019-06-29 DIAGNOSIS — F411 Generalized anxiety disorder: Secondary | ICD-10-CM | POA: Diagnosis not present

## 2019-06-29 DIAGNOSIS — F321 Major depressive disorder, single episode, moderate: Secondary | ICD-10-CM | POA: Diagnosis not present

## 2019-07-07 DIAGNOSIS — F411 Generalized anxiety disorder: Secondary | ICD-10-CM | POA: Diagnosis not present

## 2019-07-07 DIAGNOSIS — F321 Major depressive disorder, single episode, moderate: Secondary | ICD-10-CM | POA: Diagnosis not present

## 2019-07-07 DIAGNOSIS — F5105 Insomnia due to other mental disorder: Secondary | ICD-10-CM | POA: Diagnosis not present

## 2019-08-11 DIAGNOSIS — H04123 Dry eye syndrome of bilateral lacrimal glands: Secondary | ICD-10-CM | POA: Diagnosis not present

## 2019-09-03 ENCOUNTER — Other Ambulatory Visit: Payer: Self-pay | Admitting: Family Medicine

## 2019-09-03 DIAGNOSIS — E039 Hypothyroidism, unspecified: Secondary | ICD-10-CM

## 2019-09-03 NOTE — Telephone Encounter (Signed)
Pt has an appt on 09/09/19 and will be out of her thyroid meds and is asking for enough to last til her appt. Please advise

## 2019-09-03 NOTE — Telephone Encounter (Signed)
Pt called back this is her pharmacy [facility] on [address]

## 2019-09-06 MED ORDER — THYROID 120 MG PO TABS: 120.0000 mg | ORAL_TABLET | Freq: Every day | ORAL | 0 refills | Status: DC

## 2019-09-06 NOTE — Telephone Encounter (Signed)
Patient has called back stating she is completely out of her prescription and would like to see if it can be refilled today.please advise

## 2019-09-08 NOTE — Progress Notes (Signed)
Patient ID: Courtney Alvarez, female    DOB: [birth year], 44 y.o.   MRN: [id number]  PCP: Hubbard Hartshorn, FNP  Chief Complaint  Patient presents with  . Follow-up  . Hypothyroidism  . Fatigue    Fatigue has been extreme in the past two months, feels she could lay down at anytime and go to sleep    Subjective:   Courtney Alvarez is a 44 y.o. female, presents to clinic with CC of the following:  Chief Complaint  Patient presents with  . Follow-up  . Hypothyroidism  . Fatigue    Fatigue has been extreme in the past two months, feels she could lay down at anytime and go to sleep    HPI:  Patient is a 44 y.o. female patient of [facility]. Last in person visit was in May 2020. Follows up today for her thyroid levels, and also complains of increasing fatigue. She has a history of fatigue in her past, with the diagnosis of CFIDS She notes that in the past couple months, she again is feeling more fatigued where she feels like she can lay down and sleep just about anytime of the day.  She was doing well the months prior to the last two months and thinks is her thyroid.  She got a note from her pharmacy that the thyroid medicine that she received recently may have been recalled, mass that she touch base with the pharmacy and if the blood that she received was recalled, they need to provide a newer lot of the medicine if they have.  Also can wait for the results of these blood tests ordered today to help assess if we need to eventually change the dosage of the medicine. Is still working out. [employer] and getting thru the workouts. He denies any chest pains, palpitations, shortness of breath, no increased lower extremity swelling, no recent infectious symptoms with no fevers, chills, is having night sweats recently, she notes she still has intermittent periods, although has an IUD and that has not changed significantly, she thought may be potentially hormonal source to this, denies any  increased mucus or cough, denies any increased bleeding, including no dark black stools or no bleeding per rectum.  CFIDS/Multijoint Arthralgia: Fatigue had been improved - switching to [place] thyroid supplement in the past and current dose had resulted in improvement.  She still has some intermittent arthralgias, with in the past couple months, her main complaint being more fatigue.  Depression/Anxiety: Seeing Dr. Nicolasa Ducking.  Not feel increase sx's of depression recently. Mood feels stable. Not needing Melatonin as so tired and getting to sleep.  Also taking fluoxetine and Effexor.  Feels that these medications are working well for her; minimal irritability. Noticing symptom improvement.  She was going to counseling with [facility], not recent with therapist on maternity.  Hypothyroidism with multinodular goiter: was having every 37mos to q13mos U/S - was told at last visit there was no reason to continue repeated US.  Review of most recent U/S from 2018 does not mention any recommendation for ongoing monitoring as the nodule was stable and thyroid gland was mildly enlarged.  Last TSH checked in November  Lab Results  Component Value Date   TSH 0.43 03/03/2019    She denies constipation/diarrhea, no palpitations,  Mild heat/cold intolerance with some night sweats, questions some nail changes.  No hair thinning or loss concerns   Obesity: She is still exercising -notes they are fairly vigorous workouts  with [employer]  Her diet is good, tries to [place] healthy.  Weight has been relatively stable over this past year. Wt Readings from Last 3 Encounters:  09/09/19 206 lb 6.4 oz (93.6 kg)  08/17/18 203 lb (92.1 kg)  05/13/18 201 lb 12.8 oz (91.5 kg)    GERD: Taking prevacid OTC and it seems to work well for her.  Taking daily and needs daily. Discussed risk of long-term PPI therapy again today   Denies dysphagia, no black stool, chronic abdominal pain.   Patient Active Problem List   Diagnosis Date  Noted  . Gastroesophageal reflux disease without esophagitis 08/26/2018  . Moderate episode of recurrent major depressive disorder ([place]) 08/26/2018  . GAD (generalized anxiety disorder) 08/26/2018  . Dry eyes 08/26/2018  . Immune to varicella 06/01/2017  . Family history of colonic polyps   . Benign neoplasm of sigmoid colon   . Family history of colon cancer 07/31/2015  . Obesity 07/31/2015  . Loss of feeling or sensation 06/22/2015  . 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[place]) 12/16/2013  . Arthralgia of multiple joints 12/16/2013  . 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 Adult hypothyroidism 09/07/2013      Current Outpatient Medications:  .  calcium carbonate (TUMS - DOSED IN MG ELEMENTAL CALCIUM) 500 MG chewable tablet, Chew 1 tablet by mouth daily., Disp: , Rfl:  .  CEQUA 0.09 % SOLN, Apply 1 drop to eye 2 (two) times daily., Disp: , Rfl:  .  Cholecalciferol (PA VITAMIN D-3 GUMMY PO), Take 2 tablets by mouth daily. , Disp: , Rfl:  .  Crisaborole (EUCRISA) 2 % OINT, Apply TO AFFECTED AREA(S) OF RASH ON ARMS EVERY DAY AS NEEDED, Disp: , Rfl:  .  cyanocobalamin 1000 MCG tablet, Take 1,000 mcg by mouth daily., Disp: , Rfl:  .  FLUoxetine HCl 60 MG TABS, Take 60 mg by mouth every morning. , Disp: , Rfl:  .  levonorgestrel (MIRENA) 20 MCG/24HR IUD, 1 each by Intrauterine route once., Disp: , Rfl:  .  Multiple Vitamins-Minerals (MULTIVITAMIN GUMMIES WOMENS) CHEW, [place] Ser 2 tablets by mouth daily. , Disp: , Rfl:  .  Omega-3 1000 MG CAPS, Take by mouth., Disp: , Rfl:  .  ondansetron (ZOFRAN-ODT) 4 MG disintegrating tablet, Take 1 tablet (4 mg total) by mouth every 8 (eight) hours as needed for nausea or vomiting., Disp: 20 tablet, Rfl: 0 .  thyroid (NP THYROID) 120 MG tablet, Take 1 tablet (120 mg total) by mouth daily before breakfast. Take 1/2 tablet for 2 days of a week as have been doing (Patient taking differently: Take  120 mg by mouth daily before breakfast. ), Disp: 90 tablet, Rfl: 0 .  venlafaxine XR (EFFEXOR-XR) 150 MG 24 hr capsule, TK 1 C PO D, Disp: , Rfl: 1   No Known Allergies   Past Surgical History:  Procedure Laterality Date  . BLADDER SURGERY    . CERVICAL BIOPSY  W/ LOOP ELECTRODE EXCISION    . COLONOSCOPY WITH PROPOFOL N/A 01/05/2016   Procedure: COLONOSCOPY WITH PROPOFOL;  Surgeon: Lucilla Lame, MD;  Location: [place];  Service: Endoscopy;  Laterality: N/A;  . FOOT SURGERY Left   . POLYPECTOMY  01/05/2016   Procedure: POLYPECTOMY;  Surgeon: Lucilla Lame, MD;  Location: [place];  Service: Endoscopy;;     Family History  Problem Relation Age of Onset  . Arthritis Mother   . COPD Mother   .  Thyroid disease Mother   . Arthritis Father   . Hyperlipidemia Father   . Cancer Father        polyp removed  . Heart disease Father   . Hashimoto's thyroiditis Sister   . Arthritis Maternal Grandmother   . Cancer Maternal Grandmother        colon  . Heart disease Maternal Grandmother   . Thyroid disease Maternal Grandmother   . Alcohol abuse Maternal Grandfather   . Arthritis Maternal Grandfather   . Arthritis Paternal Grandmother   . Depression Paternal Grandmother   . Hypertension Paternal Grandmother   . Stroke Paternal Grandmother   . Arthritis Paternal Grandfather   . Heart disease Paternal Grandfather   . Hypertension Paternal Grandfather   . Breast cancer Other      Social History   Tobacco Use  . Smoking status: Former Smoker    Quit date: 06/14/2003    Years since quitting: 16.2  . Smokeless tobacco: Never Used  . Tobacco comment: quit 06/2003  Substance Use Topics  . Alcohol use: Yes    Alcohol/week: 0.0 standard drinks    Comment: occasionally     With staff assistance, above reviewed with the patient today.  ROS: As per HPI, otherwise no specific complaints on a limited and focused system review   No results found for this or any previous  visit (from the past 72 hour(s)).   PHQ2/9: Depression screen [facility] 2/9 09/09/2019 02/24/2019 08/26/2018 05/12/2018 10/02/2017  Decreased Interest 0 0 0 0 0  Down, Depressed, Hopeless 0 0 0 0 0  PHQ - 2 Score 0 0 0 0 0  Altered sleeping 0 0 0 0 0  Tired, decreased energy 3 0 0 0 1  Change in appetite 0 0 0 0 0  Feeling bad or failure about yourself  0 0 0 0 0  Trouble concentrating 0 0 0 0 0  Moving slowly or fidgety/restless 0 0 0 0 0  Suicidal thoughts 0 0 0 0 0  PHQ-9 Score 3 0 0 0 1  Difficult doing work/chores Very difficult Not difficult at all Not difficult at all Not difficult at all Not difficult at all   PHQ-2/9 Result reviewed  Fall Risk: Fall Risk  09/09/2019 02/24/2019 08/26/2018 05/12/2018 04/17/2018  Falls in the past year? 0 0 0 0 0  Number falls in past yr: 0 0 0 0 0  Injury with Fall? 0 0 0 0 0  Follow up - Falls evaluation completed Falls evaluation completed - -      Objective:   Vitals:   09/09/19 1456  BP: 122/82  Pulse: 88  Resp: 16  Temp: 97.9 F (36.6 C)  TempSrc: Temporal  SpO2: 97%  Weight: 206 lb 6.4 oz (93.6 kg)  Height: 5\' 6"  (1.676 m)    Body mass index is 33.31 kg/m.  Physical Exam   NAD, masked, pleasant HEENT - [place]/AT, sclera anicteric, PERRL, EOMI, conj - non-inj'ed, pharynx clear Neck - supple, no adenopathy, question a generous thyroid on exam, no discrete nodules palpated, carotids 2+ and = without bruits bilat Car - RRR without m/g/r Pulm- RR and effort normal at rest, CTA without wheeze or rales Abd - soft, NT, obese, ND, BS+,  no masses, no obvious HSM Back - no CVA tenderness Skin- no rash noted on exposed areas,  Ext - no LE edema,  Neuro/psychiatric - affect was not flat, appropriate with conversation  Alert and oriented  Grossly non-focal -  good strength on testing extremities, sensation intact to LT in distal extremities, DTRs were 2+ and equal in the patella, good finger-to-nose, Romberg negative with no pronator  drift,  Speech normal   Results for orders placed or performed in visit on 03/03/19  TSH  Result Value Ref Range   TSH 0.43 mIU/L       Assessment & Plan:  1. Chronic fatigue 2. 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[facility]) Patient notes his symptoms of fatigue have increased in the last couple months, and she is concerned is related to her thyroid as is similar to symptoms in the past when her thyroid was low.  Questions about the thyroid supplement she has been taking in the recent past with the recall from the pharmacy concern noted.  Discussed other sources of fatigue with no evidence of recent infection, no blood loss concerns, her diet has been good. Felt best to check some labs today to ensure not anemic, her ferritin level is okay, and also checking her thyroid status with a TSH. Await those results presently. - CBC with Differential/Platelet - COMPLETE METABOLIC PANEL WITH GFR - Ferritin  3. Adult hypothyroidism Recheck her thyroid status, with a TSH Also recommended a follow-up with her pharmacy, and if the lot she did have was recalled, getting that replaced, and await the labs to see if a dosage change may be needed.  - COMPLETE METABOLIC PANEL WITH GFR - TSH  4. Goiter, nontoxic, multinodular Further follow-ups were not felt needed noted in prior notes in epic, although not entirely clear from prior review of imaging studies. Await lab results, but may entertain another ultrasound at some point to ensure things have remained stable.  5. Class 1 obesity due to excess calories without serious comorbidity with body mass index (BMI) of 33.0 to 33.9 in adult Weight has remained very stable over this past year.  She is currently continuing to exercise, and also trying to eat healthy, and that continues to be encouraged. Await recheck of TSH  6. Anxiety with depression She has continued to see psychiatry, and actually had a dose of her medicine increased, as  concern that this may be contributing to her fatigue recently, she really has not noted a significant change, and really denies feeling any major depressive symptoms here in the recent past. Continue to follow with psychiatry and the medications.  7. Gastroesophageal reflux disease without esophagitis She continues with a daily PPI, and did discuss again the long-term concerns.  She notes that she is very symptomatic when she does not take, and will continue presently.  8. Night sweats Exact source unclear, may be related to thyroid, and await that rechecked.  No recent infectious symptoms of concern.  Also could be hormonal, although it is a little early based on age, but is in the differential to consider. Await initial lab tests ordered presently.  Await lab tests presently      Towanda Malkin, MD 09/09/19 3:13 PM

## 2019-09-09 ENCOUNTER — Other Ambulatory Visit: Payer: Self-pay

## 2019-09-09 ENCOUNTER — Ambulatory Visit: Payer: Federal, State, Local not specified - PPO | Admitting: Internal Medicine

## 2019-09-09 ENCOUNTER — Encounter: Payer: Self-pay | Admitting: Internal Medicine

## 2019-09-09 VITALS — BP 122/82 | HR 88 | Temp 97.9°F | Resp 16 | Ht 66.0 in | Wt 206.4 lb

## 2019-09-09 DIAGNOSIS — E042 Nontoxic multinodular goiter: Secondary | ICD-10-CM | POA: Diagnosis not present

## 2019-09-09 DIAGNOSIS — D8989 Other specified disorders involving the immune mechanism, not elsewhere classified: Secondary | ICD-10-CM | POA: Diagnosis not present

## 2019-09-09 DIAGNOSIS — E039 Hypothyroidism, unspecified: Secondary | ICD-10-CM | POA: Diagnosis not present

## 2019-09-09 DIAGNOSIS — G9332 Myalgic encephalomyelitis/chronic fatigue syndrome: Secondary | ICD-10-CM

## 2019-09-09 DIAGNOSIS — E6609 Other obesity due to excess calories: Secondary | ICD-10-CM

## 2019-09-09 DIAGNOSIS — K219 Gastro-esophageal reflux disease without esophagitis: Secondary | ICD-10-CM

## 2019-09-09 DIAGNOSIS — R61 Generalized hyperhidrosis: Secondary | ICD-10-CM

## 2019-09-09 DIAGNOSIS — Z6833 Body mass index (BMI) 33.0-33.9, adult: Secondary | ICD-10-CM

## 2019-09-09 DIAGNOSIS — R5382 Chronic fatigue, unspecified: Secondary | ICD-10-CM | POA: Diagnosis not present

## 2019-09-09 DIAGNOSIS — F418 Other specified anxiety disorders: Secondary | ICD-10-CM | POA: Insufficient documentation

## 2019-09-10 LAB — COMPLETE METABOLIC PANEL WITH GFR
AG Ratio: 1.8 (calc) (ref 1.0–2.5)
ALT: 13 U/L (ref 6–29)
AST: 17 U/L (ref 10–30)
Albumin: 4.4 g/dL (ref 3.6–5.1)
Alkaline phosphatase (APISO): 63 U/L (ref 31–125)
BUN: 25 mg/dL (ref 7–25)
CO2: 32 mmol/L (ref 20–32)
Calcium: 9.2 mg/dL (ref 8.6–10.2)
Chloride: 102 mmol/L (ref 98–110)
Creat: 0.76 mg/dL (ref 0.50–1.10)
GFR, Est African American: 111 mL/min/{1.73_m2} (ref >=60)
GFR, Est Non African American: 95 mL/min/{1.73_m2} (ref >=60)
Globulin: 2.5 g/dL (calc) (ref 1.9–3.7)
Glucose, Bld: 89 mg/dL (ref 65–99)
Potassium: 4.4 mmol/L (ref 3.5–5.3)
Sodium: 140 mmol/L (ref 135–146)
Total Bilirubin: 0.3 mg/dL (ref 0.2–1.2)
Total Protein: 6.9 g/dL (ref 6.1–8.1)

## 2019-09-10 LAB — CBC WITH DIFFERENTIAL/PLATELET
Absolute Monocytes: 616 cells/uL (ref 200–950)
Basophils Absolute: 41 cells/uL (ref 0–200)
Basophils Relative: 0.5 %
Eosinophils Absolute: 113 cells/uL (ref 15–500)
Eosinophils Relative: 1.4 %
HCT: 42.4 % (ref 35.0–45.0)
Hemoglobin: 13.8 g/dL (ref 11.7–15.5)
Lymphs Abs: 2260 cells/uL (ref 850–3900)
MCH: 28.6 pg (ref 27.0–33.0)
MCHC: 32.5 g/dL (ref 32.0–36.0)
MCV: 88 fL (ref 80.0–100.0)
MPV: 10.2 fL (ref 7.5–12.5)
Monocytes Relative: 7.6 %
Neutro Abs: 5071 cells/uL (ref 1500–7800)
Neutrophils Relative %: 62.6 %
Platelets: 298 10*3/uL (ref 140–400)
RBC: 4.82 10*6/uL (ref 3.80–5.10)
RDW: 12.4 % (ref 11.0–15.0)
Total Lymphocyte: 27.9 %
WBC: 8.1 10*3/uL (ref 3.8–10.8)

## 2019-09-10 LAB — TSH: TSH: 1.91 mIU/L

## 2019-10-25 DIAGNOSIS — F411 Generalized anxiety disorder: Secondary | ICD-10-CM | POA: Diagnosis not present

## 2019-10-25 DIAGNOSIS — F321 Major depressive disorder, single episode, moderate: Secondary | ICD-10-CM | POA: Diagnosis not present

## 2019-10-25 DIAGNOSIS — F5105 Insomnia due to other mental disorder: Secondary | ICD-10-CM | POA: Diagnosis not present

## 2019-11-23 ENCOUNTER — Encounter: Payer: Self-pay | Admitting: Family Medicine

## 2019-12-08 ENCOUNTER — Other Ambulatory Visit: Payer: Self-pay | Admitting: Internal Medicine

## 2019-12-08 DIAGNOSIS — E039 Hypothyroidism, unspecified: Secondary | ICD-10-CM

## 2019-12-08 DIAGNOSIS — H04123 Dry eye syndrome of bilateral lacrimal glands: Secondary | ICD-10-CM | POA: Diagnosis not present

## 2019-12-08 NOTE — Telephone Encounter (Signed)
Not a [place] patient.  Please address.  Thank you.

## 2020-01-19 DIAGNOSIS — F5105 Insomnia due to other mental disorder: Secondary | ICD-10-CM | POA: Diagnosis not present

## 2020-01-19 DIAGNOSIS — F321 Major depressive disorder, single episode, moderate: Secondary | ICD-10-CM | POA: Diagnosis not present

## 2020-01-19 DIAGNOSIS — F411 Generalized anxiety disorder: Secondary | ICD-10-CM | POA: Diagnosis not present

## 2020-02-24 ENCOUNTER — Other Ambulatory Visit: Payer: Self-pay | Admitting: Internal Medicine

## 2020-02-24 DIAGNOSIS — Z1231 Encounter for screening mammogram for malignant neoplasm of breast: Secondary | ICD-10-CM

## 2020-03-15 ENCOUNTER — Encounter: Payer: Self-pay | Admitting: Internal Medicine

## 2020-03-15 ENCOUNTER — Telehealth (INDEPENDENT_AMBULATORY_CARE_PROVIDER_SITE_OTHER): Payer: Federal, State, Local not specified - PPO | Admitting: Internal Medicine

## 2020-03-15 ENCOUNTER — Other Ambulatory Visit: Payer: Self-pay

## 2020-03-15 DIAGNOSIS — J0100 Acute maxillary sinusitis, unspecified: Secondary | ICD-10-CM | POA: Diagnosis not present

## 2020-03-15 MED ORDER — AMOXICILLIN-POT CLAVULANATE 875-125 MG PO TABS: 1.0000 | ORAL_TABLET | Freq: Two times a day (BID) | ORAL | 0 refills | Status: DC

## 2020-03-15 NOTE — Progress Notes (Signed)
Name: Courtney Alvarez   MRN: [id number]    DOB: October 24, 1975   Date:03/15/2020       Progress Note  Subjective  Chief Complaint  Chief Complaint  Patient presents with  . Cough    She has had these symptoms x 1 month and they are rapidly worsening.  . Fatigue  . Headache  . Facial Pain    Sinus pressure.    I connected with  Makenzie D Wisinski on 03/15/20 at  1:40 PM EST by telephone and verified that I am speaking with the correct person using two identifiers.  I discussed the limitations, risks, security and privacy concerns of performing an evaluation and management service by telephone and the availability of in person appointments. The patient expressed understanding and agreed to proceed. Staff also discussed with the patient that there may be a patient responsible charge related to this service. Patient Location: Home Provider Location: [facility] Additional Individuals present: none  HPI  Patient is a 44 year old female Last visit with me was in May, 2021 for fatigue. Follows up today with increasing sinus pains and cough. Had Covid vaccine, 2nd shot in October  No marked cough, no production, + sneezing No marked SOB No fever,  minimal sore throat.  + congestion, + sinus pressure, hurts a lot when presses on her sinuses, +PND - "nasty stuff", occasionally bloody No loss of smell, loss of taste No N/V, + HA No muscle aches No marked loose stools/diarrhea No CP, passing out episodes Tried tylenol prn pain, mucinex sinus prn Comorbid conditions reviewed No asthma Quit tob 2005,  No h/o DM, heart disease, CKD, + obesity  All - NKDA  Patient Active Problem List   Diagnosis Date Noted  . Anxiety with depression 09/09/2019  . Gastroesophageal reflux disease without esophagitis 08/26/2018  . Moderate episode of recurrent major depressive disorder ([place]) 08/26/2018  . GAD (generalized anxiety disorder) 08/26/2018  . Dry eyes 08/26/2018  . Immune to varicella 06/01/2017  .  Family history of colonic polyps   . Benign neoplasm of sigmoid colon   . Family history of colon cancer 07/31/2015  . Class 1 obesity due to excess calories without serious comorbidity with body mass index (BMI) of 33.0 to 33.9 in adult 07/31/2015  . Loss of feeling or sensation 06/22/2015  . 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[place]) 12/16/2013  . Arthralgia of multiple joints 12/16/2013  . 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 Adult hypothyroidism 09/07/2013    Past Surgical History:  Procedure Laterality Date  . BLADDER SURGERY    . CERVICAL BIOPSY  W/ LOOP ELECTRODE EXCISION    . COLONOSCOPY WITH PROPOFOL N/A 01/05/2016   Procedure: COLONOSCOPY WITH PROPOFOL;  Surgeon: Lucilla Lame, MD;  Location: [place];  Service: Endoscopy;  Laterality: N/A;  . FOOT SURGERY Left   . POLYPECTOMY  01/05/2016   Procedure: POLYPECTOMY;  Surgeon: Lucilla Lame, MD;  Location: [place];  Service: Endoscopy;;    Family History  Problem Relation Age of Onset  . Arthritis Mother   . COPD Mother   . Thyroid disease Mother   . Arthritis Father   . Hyperlipidemia Father   . Cancer Father        polyp removed  . Heart disease Father   . Hashimoto's thyroiditis Sister   . Arthritis Maternal Grandmother   . Cancer Maternal Grandmother        colon  .  Heart disease Maternal Grandmother   . Thyroid disease Maternal Grandmother   . Alcohol abuse Maternal Grandfather   . Arthritis Maternal Grandfather   . Arthritis Paternal Grandmother   . Depression Paternal Grandmother   . Hypertension Paternal Grandmother   . Stroke Paternal Grandmother   . Arthritis Paternal Grandfather   . Heart disease Paternal Grandfather   . Hypertension Paternal Grandfather   . Breast cancer Other     Social History   Tobacco Use  . Smoking status: Former Smoker    Quit date: 06/14/2003    Years since quitting: 16.7  . Smokeless  tobacco: Never Used  . Tobacco comment: quit 06/2003  Substance Use Topics  . Alcohol use: Yes    Alcohol/week: 0.0 standard drinks    Comment: occasionally      Current Outpatient Medications:  .  temazepam (RESTORIL) 15 MG capsule, Take 15 mg by mouth at bedtime as needed., Disp: , Rfl:  .  XIIDRA 5 % SOLN, Place 1 drop into both eyes 2 (two) times daily., Disp: , Rfl:  .  calcium carbonate (TUMS - DOSED IN MG ELEMENTAL CALCIUM) 500 MG chewable tablet, Chew 1 tablet by mouth daily., Disp: , Rfl:  .  CEQUA 0.09 % SOLN, Apply 1 drop to eye 2 (two) times daily., Disp: , Rfl:  .  Cholecalciferol (PA VITAMIN D-3 GUMMY PO), Take 2 tablets by mouth daily. , Disp: , Rfl:  .  Crisaborole (EUCRISA) 2 % OINT, Apply TO AFFECTED AREA(S) OF RASH ON ARMS EVERY DAY AS NEEDED, Disp: , Rfl:  .  cyanocobalamin 1000 MCG tablet, Take 1,000 mcg by mouth daily., Disp: , Rfl:  .  FLUoxetine HCl 60 MG TABS, Take 60 mg by mouth every morning. , Disp: , Rfl:  .  levonorgestrel (MIRENA) 20 MCG/24HR IUD, 1 each by Intrauterine route once., Disp: , Rfl:  .  Multiple Vitamins-Minerals (MULTIVITAMIN GUMMIES WOMENS) CHEW, [place]ina Ser 2 tablets by mouth daily. , Disp: , Rfl:  .  Omega-3 1000 MG CAPS, Take by mouth., Disp: , Rfl:  .  ondansetron (ZOFRAN-ODT) 4 MG disintegrating tablet, Take 1 tablet (4 mg total) by mouth every 8 (eight) hours as needed for nausea or vomiting., Disp: 20 tablet, Rfl: 0 .  thyroid (NP THYROID) 120 MG tablet, Take 1 tablet (120 mg total) by mouth daily before breakfast., Disp: 90 tablet, Rfl: 2 .  venlafaxine XR (EFFEXOR-XR) 150 MG 24 hr capsule, TK 1 C PO D, Disp: , Rfl: 1  No Known Allergies  With staff assistance, above reviewed with the patient today.  ROS: As per HPI, otherwise no specific complaints on a limited and focused system review   Objective  Virtual encounter, vitals not obtained.  There is no height or weight on file to calculate BMI.  Physical Exam   Appears in NAD via  conversation Breathing: No obvious respiratory distress. Speaking in complete sentences Neurological: Pt is alert, Speech is normal Psychiatric: Patient has a normal mood and affect, Judgment and thought content normal.   No results found for this or any previous visit (from the past 72 hour(s)).  PHQ2/9: Depression screen [facility] 2/9 03/15/2020 09/09/2019 02/24/2019 08/26/2018 05/12/2018  Decreased Interest 1 0 0 0 0  Down, Depressed, Hopeless 0 0 0 0 0  PHQ - 2 Score 1 0 0 0 0  Altered sleeping 0 0 0 0 0  Tired, decreased energy 3 3 0 0 0  Change in appetite 0 0 0 0 0  Feeling bad or failure about yourself  0 0 0 0 0  Trouble concentrating 0 0 0 0 0  Moving slowly or fidgety/restless 0 0 0 0 0  Suicidal thoughts 0 0 0 0 0  PHQ-9 Score 4 3 0 0 0  Difficult doing work/chores Somewhat difficult Very difficult Not difficult at all Not difficult at all Not difficult at all   PHQ-2/9 Result reviewed  Fall Risk: Fall Risk  03/15/2020 09/09/2019 02/24/2019 08/26/2018 05/12/2018  Falls in the past year? 0 0 0 0 0  Number falls in past yr: 0 0 0 0 0  Injury with Fall? 0 0 0 0 0  Follow up - - Falls evaluation completed Falls evaluation completed -     Assessment & Plan  1. Acute non-recurrent maxillary sinusitis Do feel her symptoms are more consistent with an acute sinusitis, and do feel best management is as follows. - amoxicillin-clavulanate (AUGMENTIN) 875-125 MG tablet; Take 1 tablet by mouth 2 (two) times daily.  Dispense: 20 tablet; Refill: 0 Also can use a Flonase product-once daily to help, and did note that it sometimes can have a drying effect and if she is having more bleeding from the nose, would lessen use of the Flonase product. Can continue the Mucinex sinus product she has as well.  Also continue Tylenol products as needed for discomfort. We will follow-up if symptoms not improving or more problematic over time as we discussed. I also noted if she develops more concerning  symptoms in association with more cough, other potential Covid symptoms as asked today, needs to follow-up, and may need to get tested for Covid and possibly other measures pending that reassessment.  She was understanding of that.   I discussed the assessment and treatment plan with the patient. The patient was provided an opportunity to ask questions and all were answered. The patient agreed with the plan and demonstrated an understanding of the instructions.  The patient was advised to call back or seek an in-person evaluation if the symptoms worsen or if the condition fails to improve as anticipated.  I provided 15 minutes of non-face-to-face time during this encounter that included discussing at length patient's sx/history, pertinent pmhx, medications, treatment and follow up plan. This time also included the necessary documentation, orders, and chart review.  Towanda Malkin, MD

## 2020-04-20 DIAGNOSIS — F5105 Insomnia due to other mental disorder: Secondary | ICD-10-CM | POA: Diagnosis not present

## 2020-04-20 DIAGNOSIS — F321 Major depressive disorder, single episode, moderate: Secondary | ICD-10-CM | POA: Diagnosis not present

## 2020-04-20 DIAGNOSIS — F411 Generalized anxiety disorder: Secondary | ICD-10-CM | POA: Diagnosis not present

## 2020-04-25 ENCOUNTER — Encounter: Payer: Self-pay | Admitting: Internal Medicine

## 2020-05-11 ENCOUNTER — Encounter: Payer: Self-pay | Admitting: Certified Nurse Midwife

## 2020-05-11 ENCOUNTER — Ambulatory Visit: Payer: Federal, State, Local not specified - PPO | Admitting: Certified Nurse Midwife

## 2020-05-11 ENCOUNTER — Other Ambulatory Visit: Payer: Self-pay

## 2020-05-11 VITALS — BP 119/78 | HR 77 | Ht 66.0 in | Wt 206.1 lb

## 2020-05-11 DIAGNOSIS — R5382 Chronic fatigue, unspecified: Secondary | ICD-10-CM

## 2020-05-11 DIAGNOSIS — T8383XA Hemorrhage of genitourinary prosthetic devices, implants and grafts, initial encounter: Secondary | ICD-10-CM

## 2020-05-11 DIAGNOSIS — N951 Menopausal and female climacteric states: Secondary | ICD-10-CM

## 2020-05-11 DIAGNOSIS — N923 Ovulation bleeding: Secondary | ICD-10-CM | POA: Diagnosis not present

## 2020-05-11 DIAGNOSIS — Z975 Presence of (intrauterine) contraceptive device: Secondary | ICD-10-CM | POA: Insufficient documentation

## 2020-05-11 DIAGNOSIS — IMO0001 Reserved for inherently not codable concepts without codable children: Secondary | ICD-10-CM

## 2020-05-11 DIAGNOSIS — Z30433 Encounter for removal and reinsertion of intrauterine contraceptive device: Secondary | ICD-10-CM | POA: Diagnosis not present

## 2020-05-11 LAB — POCT URINE PREGNANCY: Preg Test, Ur: NEGATIVE

## 2020-05-11 NOTE — Patient Instructions (Addendum)
IUD PLACEMENT POST-PROCEDURE INSTRUCTIONS  1. You may take Ibuprofen, Aleve or Tylenol for pain if needed.  Cramping should resolve within in 24 hours.  2. You may have a small amount of spotting.  You should wear a mini pad for the next few days.  3. You may have intercourse after 72 hours.  If you using this for birth control, it is effective immediately.  4. You need to call if you have any pelvic pain, fever, heavy bleeding or foul smelling vaginal discharge.  Irregular bleeding is common the first several months after having an IUD placed. You do not need to call for this reason unless you are concerned.  5. Shower or bathe as normal  6. You should have a follow-up appointment in 4-8 weeks for a re-check to make sure you are not having any problems.  Windy Fast of Endocrinology (14th ed., pp. [phone]). [place], [place]: Elsevier.">  Perimenopause Perimenopause is the normal time of a woman's life when the levels of estrogen, the female hormone produced by the ovaries, begin to decrease. This leads to changes in menstrual periods before they stop completely (menopause). Perimenopause can begin 2-8 years before menopause. During perimenopause, the ovaries may or may not produce an egg and a woman can still become pregnant. What are the causes? This condition is caused by a natural change in hormone levels that happens as you get older. What increases the risk? This condition is more likely to start at an earlier age if you have certain medical conditions or have undergone treatments, including:  A tumor of the pituitary gland in the brain.  A disease that affects the ovaries and hormone production.  Certain cancer treatments, such as chemotherapy or hormone therapy, or radiation therapy on the pelvis.  Heavy smoking and excessive alcohol use.  Family history of early menopause. What are the signs or symptoms? Perimenopausal changes affect each woman differently. Symptoms of  this condition may include:  Hot flashes.  Irregular menstrual periods.  Night sweats.  Changes in feelings about sex. This could be a decrease in sex drive or an increased discomfort around your sexuality.  Vaginal dryness.  Headaches.  Mood swings.  Depression.  Problems sleeping (insomnia).  Memory problems or trouble concentrating.  Irritability.  Tiredness.  Weight gain.  Anxiety.  Trouble getting pregnant. How is this diagnosed? This condition is diagnosed based on your medical history, a physical exam, your age, your menstrual history, and your symptoms. Hormone tests may also be done. How is this treated? In some cases, no treatment is needed. You and your health care provider should make a decision together about whether treatment is necessary. Treatment will be based on your individual condition and preferences. Various treatments are available, such as:  Menopausal hormone therapy (MHT).  Medicines to treat specific symptoms.  Acupuncture.  Vitamin or herbal supplements. Before starting treatment, make sure to let your health care provider know if you have a personal or family history of:  Heart disease.  Breast cancer.  Blood clots.  Diabetes.  Osteoporosis. Follow these instructions at home: Medicines  Take over-the-counter and prescription medicines only as told by your health care provider.  Take vitamin supplements only as told by your health care provider.  Talk with your health care provider before starting any herbal supplements. Lifestyle  Do not use any products that contain nicotine or tobacco, such as cigarettes, e-cigarettes, and chewing tobacco. If you need help quitting, ask your health care provider.  Get at  least 30 minutes of physical activity on 5 or more days each week.  Eat a balanced diet that includes fresh fruits and vegetables, whole grains, soybeans, eggs, lean meat, and low-fat dairy.  Avoid alcoholic and  caffeinated beverages, as well as spicy foods. This may help prevent hot flashes.  Get 7-8 hours of sleep each night.  Dress in layers that can be removed to help you manage hot flashes.  Find ways to manage stress, such as deep breathing, meditation, or journaling.   General instructions  Keep track of your menstrual periods, including: ? When they occur. ? How heavy they are and how long they last. ? How much time passes between periods.  Keep track of your symptoms, noting when they start, how often you have them, and how long they last.  Use vaginal lubricants or moisturizers to help with vaginal dryness and improve comfort during sex.  You can still become pregnant if you are having irregular periods. Make sure you use contraception during perimenopause if you do not want to get pregnant.  Keep all follow-up visits. This is important. This includes any group therapy or counseling.   Contact a health care provider if:  You have heavy vaginal bleeding or pass blood clots.  Your period lasts more than 2 days longer than normal.  Your periods are recurring sooner than 21 days.  You bleed after having sex.  You have pain during sex. Get help right away if you have:  Chest pain, trouble breathing, or trouble talking.  Severe depression.  Pain when you urinate.  Severe headaches.  Vision problems. Summary  Perimenopause is the time when a woman's body begins to move into menopause. This may happen naturally or as a result of other health problems or medical treatments.  Perimenopause can begin 2-8 years before menopause, and it can last for several years.  Perimenopausal symptoms can be managed through medicines, lifestyle changes, and complementary therapies such as acupuncture. This information is not intended to replace advice given to you by your health care provider. Make sure you discuss any questions you have with your health care provider. Document Revised:  09/16/2019 Document Reviewed: 09/16/2019 Elsevier Patient Education  [place].

## 2020-05-11 NOTE — Progress Notes (Deleted)
GYN ENCOUNTER NOTE  Subjective:       Courtney Alvarez is a 45 y.o. G84P1001 female is here for gynecologic evaluation of the following issues:  1. IUD check, not sure the date of insertion, possible removal and reinsertion   2. Headaches, Chiropractic care and home treatment 3. Spotting 4. Fatigue, "I just want to sleep all day"  Patient  Denies breathing difficulty, respiratory    Gynecologic History No LMP recorded. (Menstrual status: IUD).  Period Pattern: (!) Irregular Menstrual Flow: Light Menstrual Control: Panty liner Dysmenorrhea: None Contraception: IUD Mirena 2014 Last Pap: 16109. Results were: Neg/Neg Last mammogram: 10/072020. Results were: BI-RADS 1: Negative  Obstetric History OB History  Gravida Para Term Preterm AB Living  1 1 1     1   SAB IAB Ectopic Multiple Live Births          1    # Outcome Date GA Lbr Len/2nd Weight Sex Delivery Anes PTL Lv  1 Term 03/15/05 [redacted]w[redacted]d  3742 g F Vag-Spont EPI N LIV    Past Medical History:  Diagnosis Date  . Arthritis   . Chronic fatigue syndrome   . GERD (gastroesophageal reflux disease)   . Headache    tension/cluster  . Hypothyroidism   . Motion sickness   . Plantar fasciitis of left foot   . PONV (postoperative nausea and vomiting)   . Thyroid disease   . Vaginal Pap smear, abnormal     Past Surgical History:  Procedure Laterality Date  . BLADDER SURGERY    . CERVICAL BIOPSY  W/ LOOP ELECTRODE EXCISION    . COLONOSCOPY WITH PROPOFOL N/A 01/05/2016   Procedure: COLONOSCOPY WITH PROPOFOL;  Surgeon: Lucilla Lame, MD;  Location: [place];  Service: Endoscopy;  Laterality: N/A;  . FOOT SURGERY Left   . POLYPECTOMY  01/05/2016   Procedure: POLYPECTOMY;  Surgeon: Lucilla Lame, MD;  Location: [place];  Service: Endoscopy;;    Current Outpatient Medications on File Prior to Visit  Medication Sig Dispense Refill  . calcium carbonate (TUMS - DOSED IN MG ELEMENTAL CALCIUM) 500 MG chewable tablet  Chew 1 tablet by mouth daily.    . Cholecalciferol (PA VITAMIN D-3 GUMMY PO) Take 2 tablets by mouth daily.     . Crisaborole 2 % OINT Apply TO AFFECTED AREA(S) OF RASH ON ARMS EVERY DAY AS NEEDED    . cyanocobalamin 1000 MCG tablet Take 1,000 mcg by mouth daily.    Marland Kitchen FLUoxetine HCl 60 MG TABS Take 60 mg by mouth every morning.     . lansoprazole (PREVACID) 15 MG capsule     . levonorgestrel (MIRENA) 20 MCG/24HR IUD 1 each by Intrauterine route once.    . Multiple Vitamins-Minerals (MULTIVITAMIN GUMMIES WOMENS) CHEW Chew 2 tablets by mouth daily.     . Omega-3 1000 MG CAPS Take by mouth.    . ondansetron (ZOFRAN-ODT) 4 MG disintegrating tablet Take 1 tablet (4 mg total) by mouth every 8 (eight) hours as needed for nausea or vomiting. 20 tablet 0  . temazepam (RESTORIL) 15 MG capsule Take 15 mg by mouth at bedtime as needed.    . thyroid (NP THYROID) 120 MG tablet Take 1 tablet (120 mg total) by mouth daily before breakfast. 90 tablet 2  . venlafaxine XR (EFFEXOR-XR) 150 MG 24 hr capsule TK 1 C PO D  1  . XIIDRA 5 % SOLN Place 1 drop into both eyes 2 (two) times daily.    Marland Kitchen  amoxicillin-clavulanate (AUGMENTIN) 875-125 MG tablet Take 1 tablet by mouth 2 (two) times daily. 20 tablet 0  . CEQUA 0.09 % SOLN Apply 1 drop to eye 2 (two) times daily.     No current facility-administered medications on file prior to visit.    No Known Allergies  Social History   Socioeconomic History  . Marital status: Married    Spouse name: Annie Main  . Number of children: 1  . Years of education: Not on file  . Highest education level: Associate degree: occupational, Hotel manager, or vocational program  Occupational History  . Occupation: Therapist, sports  Tobacco Use  . Smoking status: Former Smoker    Quit date: 06/14/2003    Years since quitting: 16.9  . Smokeless tobacco: Never Used  . Tobacco comment: quit 06/2003  Vaping Use  . Vaping Use: Never used  Substance and Sexual Activity  . Alcohol use: Yes     Alcohol/week: 0.0 standard drinks    Comment: occasionally   . Drug use: No  . Sexual activity: Yes    Partners: Male    Birth control/protection: I.U.D.    Comment: mirena  Other Topics Concern  . Not on file  Social History Narrative  . Not on file   Social Determinants of Health   Financial Resource Strain: Not on file  Food Insecurity: Not on file  Transportation Needs: Not on file  Physical Activity: Not on file  Stress: Not on file  Social Connections: Not on file  Intimate Partner Violence: Not on file    Family History  Problem Relation Age of Onset  . Arthritis Mother   . COPD Mother   . Thyroid disease Mother   . Arthritis Father   . Hyperlipidemia Father   . Cancer Father        polyp removed  . Heart disease Father   . Hashimoto's thyroiditis Sister   . Arthritis Maternal Grandmother   . Cancer Maternal Grandmother        colon  . Heart disease Maternal Grandmother   . Thyroid disease Maternal Grandmother   . Alcohol abuse Maternal Grandfather   . Arthritis Maternal Grandfather   . Arthritis Paternal Grandmother   . Depression Paternal Grandmother   . Hypertension Paternal Grandmother   . Stroke Paternal Grandmother   . Arthritis Paternal Grandfather   . Heart disease Paternal Grandfather   . Hypertension Paternal Grandfather   . Breast cancer Other     The following portions of the patient's history were reviewed and updated as appropriate: allergies, current medications, past family history, past medical history, past social history, past surgical history and problem list.  Review of Systems Review of Systems - {ros master:[id number]} Review of Systems - General ROS: negative for - chills, fatigue, fever, hot flashes, malaise or night sweats Hematological and Lymphatic ROS: negative for - bleeding problems or swollen lymph nodes Gastrointestinal ROS: negative for - abdominal pain, blood in stools, change in bowel habits and  nausea/vomiting Musculoskeletal ROS: negative for - joint pain, muscle pain or muscular weakness Genito-Urinary ROS: negative for - change in menstrual cycle, dysmenorrhea, dyspareunia, dysuria, genital discharge, genital ulcers, hematuria, incontinence, irregular/heavy menses, nocturia or pelvic painjj  Objective:   BP 119/78   Pulse 77   Ht 5\' 6"  (1.676 m)   Wt 93.5 kg   BMI 33.26 kg/m  CONSTITUTIONAL: Well-developed, well-nourished female in no acute distress.  HENT:  Normocephalic, atraumatic.  NECK: Normal range of motion, supple, no masses.  Normal  thyroid.  SKIN: Skin is warm and dry. No rash noted. Not diaphoretic. No erythema. No pallor. Newborn: Alert and oriented to person, place, and time. PSYCHIATRIC: Normal mood and affect. Normal behavior. Normal judgment and thought content. CARDIOVASCULAR:Not Examined RESPIRATORY: Not Examined BREASTS: Not Examined ABDOMEN: Soft, non distended; Non tender.  No Organomegaly. PELVIC:  External Genitalia: Normal  BUS: Normal  Vagina: Normal  Cervix: Normal  Uterus: Normal size, shape,consistency, mobile  Adnexa: Normal  RV: Normal   Bladder: Nontender MUSCULOSKELETAL: Normal range of motion. No tenderness.  No cyanosis, clubbing, or edema.     Assessment:   1. IUD check up ***  2. Intermenstrual spotting due to intrauterine device (IUD), initial encounter ([place]) ***  3. Perimenopausal symptom ***     Plan:   ***

## 2020-05-11 NOTE — Progress Notes (Signed)
Courtney Alvarez is a 45 y.o. year old G61P1001 Caucasian female who presents for removal and replacement of a Mirena IUD. She was given informed consent for removal and reinsertion of her Mirena. Her Mirena was placed 08/01/2019, No LMP recorded. (Menstrual status: IUD)., and her pregnancy test today was negative.   The risks and benefits of the method and placement have been thouroughly reviewed with the patient and all questions were answered.  Specifically the patient is aware of failure rate of 04/998, expulsion of the IUD and of possible perforation.  The patient is aware of irregular bleeding due to the method and understands the incidence of irregular bleeding diminishes with time.  Signed copy of informed consent in chart.   BP 119/78   Pulse 77   Ht 5\' 6"  (1.676 m)   Wt 206 lb 1 oz (93.5 kg)   BMI 33.26 kg/m    Appropriate time out taken. A small plastic speculum was placed in the vagina.  The cervix was visualized, prepped using Betadine. The strings were visible. They were grasped and the Mirena was easily removed and SNM.   The rest of procedure was completed by CNM. The cervix was then grasped with a single-tooth tenaculum. The uterus was  sounded to 7 cm.  Mirena IUD placed per manufacturer's recommendations without complications. The strings were trimmed to 3 cm.  The patient tolerated the procedure well.   The patient was given post procedure instructions, including signs and symptoms of infection and to check for the strings after each menses or each month, and refraining from intercourse or anything in the vagina for 3 days.  She was given a Mirena care card with date Mirena placed, and date Mirena to be removed.  Labs today for perimenopausal symptoms, see orders.   Reviewed red flag symptoms and when to call.   RTC x 4-6 weeks for IUD string check.   RTC x 8 months for ANNUAL EXAM or sooner if needed.    Deitra Mayo, Student Nurse Midwife [facility], CNM [facility], [facility] 05/11/20 11:30 AM   NDC: 17494-496-75 Lot: [id number] Exp: 05/2022

## 2020-05-12 ENCOUNTER — Encounter: Payer: Self-pay | Admitting: Family Medicine

## 2020-05-12 ENCOUNTER — Other Ambulatory Visit: Payer: Self-pay

## 2020-05-12 ENCOUNTER — Telehealth (INDEPENDENT_AMBULATORY_CARE_PROVIDER_SITE_OTHER): Payer: Federal, State, Local not specified - PPO | Admitting: Family Medicine

## 2020-05-12 DIAGNOSIS — J0101 Acute recurrent maxillary sinusitis: Secondary | ICD-10-CM | POA: Diagnosis not present

## 2020-05-12 LAB — FSH/LH
FSH: 8.1 m[IU]/mL
LH: 6.3 m[IU]/mL

## 2020-05-12 LAB — TESTOSTERONE, FREE, TOTAL, SHBG
Sex Hormone Binding: 48.1 nmol/L (ref 24.6–122.0)
Testosterone, Free: 1.6 pg/mL (ref 0.0–4.2)
Testosterone: 11 ng/dL (ref 4–50)

## 2020-05-12 LAB — ESTRADIOL: Estradiol: 75 pg/mL

## 2020-05-12 LAB — PROGESTERONE: Progesterone: 0.5 ng/mL

## 2020-05-12 MED ORDER — DOXYCYCLINE HYCLATE 100 MG PO CAPS: 100.0000 mg | ORAL_CAPSULE | Freq: Two times a day (BID) | ORAL | 0 refills | Status: DC

## 2020-05-12 NOTE — Progress Notes (Addendum)
Name: Courtney Alvarez   MRN: [id number]    DOB: January 19, 1976   Date:05/12/2020       Progress Note  Subjective:    Chief Complaint  Chief Complaint  Patient presents with  . Sinusitis    Sinus pressure, headache, fatigue, runny nose, congested, fever    I connected with  Yiselle D Gasbarro  on 05/12/20 at  2:40 PM EST by a video enabled telemedicine application and verified that I am speaking with the correct person using two identifiers.  I discussed the limitations of evaluation and management by telemedicine and the availability of in person appointments. The patient expressed understanding and agreed to proceed. Staff also discussed with the patient that there may be a patient responsible charge related to this service. Patient Location: home Provider Location: [facility] Additional Individuals present: none  HPI She has cough congestion sinus pressure around eyes and pain Returned worse (recurrent) in the past 2 weeks She was tested for COVID for work - neg  Not doing flonase, doing saline washes in nose  Allergy testing in the past   Patient Active Problem List   Diagnosis Date Noted  . IUD (intrauterine device) in place 05/11/2020  . Anxiety with depression 09/09/2019  . Gastroesophageal reflux disease without esophagitis 08/26/2018  . Moderate episode of recurrent major depressive disorder ([place]) 08/26/2018  . GAD (generalized anxiety disorder) 08/26/2018  . Dry eyes 08/26/2018  . Immune to varicella 06/01/2017  . Family history of colonic polyps   . Benign neoplasm of sigmoid colon   . Family history of colon cancer 07/31/2015  . Class 1 obesity due to excess calories without serious comorbidity with body mass index (BMI) of 33.0 to 33.9 in adult 07/31/2015  . Loss of feeling or sensation 06/22/2015  . 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[place]) 12/16/2013  . Arthralgia of multiple joints 12/16/2013  . Goiter,  nontoxic, 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  . Adult hypothyroidism 09/07/2013    Social History   Tobacco Use  . Smoking status: Former Smoker    Quit date: 06/14/2003    Years since quitting: 16.9  . Smokeless tobacco: Never Used  . Tobacco comment: quit 06/2003  Substance Use Topics  . Alcohol use: Yes    Alcohol/week: 0.0 standard drinks    Comment: occasionally      Current Outpatient Medications:  .  calcium carbonate (TUMS - DOSED IN MG ELEMENTAL CALCIUM) 500 MG chewable tablet, Chew 1 tablet by mouth daily., Disp: , Rfl:  .  Cholecalciferol (PA VITAMIN D-3 GUMMY PO), Take 2 tablets by mouth daily. , Disp: , Rfl:  .  Crisaborole 2 % OINT, Apply TO AFFECTED AREA(S) OF RASH ON ARMS EVERY DAY AS NEEDED, Disp: , Rfl:  .  cyanocobalamin 1000 MCG tablet, Take 1,000 mcg by mouth daily., Disp: , Rfl:  .  FLUoxetine HCl 60 MG TABS, Take 60 mg by mouth every morning. , Disp: , Rfl:  .  lansoprazole (PREVACID) 15 MG capsule, , Disp: , Rfl:  .  levonorgestrel (MIRENA) 20 MCG/24HR IUD, 1 each by Intrauterine route once., Disp: , Rfl:  .  Multiple Vitamins-Minerals (MULTIVITAMIN GUMMIES WOMENS) CHEW, [place] Ser 2 tablets by mouth daily. , Disp: , Rfl:  .  Omega-3 1000 MG CAPS, Take by mouth., Disp: , Rfl:  .  ondansetron (ZOFRAN-ODT) 4 MG disintegrating tablet, Take 1 tablet (4 mg total) by mouth every 8 (eight) hours as needed for  nausea or vomiting., Disp: 20 tablet, Rfl: 0 .  temazepam (RESTORIL) 15 MG capsule, Take 15 mg by mouth at bedtime as needed., Disp: , Rfl:  .  thyroid (NP THYROID) 120 MG tablet, Take 1 tablet (120 mg total) by mouth daily before breakfast., Disp: 90 tablet, Rfl: 2 .  venlafaxine XR (EFFEXOR-XR) 150 MG 24 hr capsule, TK 1 C PO D, Disp: , Rfl: 1 .  XIIDRA 5 % SOLN, Place 1 drop into both eyes 2 (two) times daily., Disp: , Rfl:   No Known Allergies  I personally reviewed active problem list, medication list, allergies, family history, social history, health maintenance, notes  from last encounter, lab results, imaging with the patient/caregiver today.   Review of Systems  Constitutional: Negative.  Negative for activity change, appetite change, chills, diaphoresis, fatigue and fever.  HENT: Negative.   Eyes: Negative.   Respiratory: Negative.  Negative for cough, choking, shortness of breath and wheezing.   Cardiovascular: Negative.  Negative for chest pain.  Gastrointestinal: Negative.   Endocrine: Negative.   Genitourinary: Negative.   Musculoskeletal: Negative.   Skin: Negative.   Allergic/Immunologic: Negative.   Neurological: Negative.   Hematological: Negative.   Psychiatric/Behavioral: Negative.   All other systems reviewed and are negative.     Objective:   Virtual encounter, vitals limited, only able to obtain the following There were no vitals filed for this visit. There is no height or weight on file to calculate BMI. Nursing Note and Vital Signs reviewed.  Physical Exam Vitals and nursing note reviewed.  Constitutional:      Appearance: Normal appearance.  Neurological:     Mental Status: She is alert.  Psychiatric:        Mood and Affect: Mood normal.        Behavior: Behavior normal.     PE limited by VIRTUAL encounter  Results for orders placed or performed in visit on 05/11/20 (from the past 72 hour(s))  POCT urine pregnancy     Status: None   Collection Time: 05/11/20 11:46 AM  Result Value Ref Range   Preg Test, Ur Negative Negative    Assessment and Plan:     ICD-10-CM   1. Acute recurrent maxillary sinusitis  J01.01 Novel Coronavirus, NAA ([facility])    doxycycline (VIBRAMYCIN) 100 MG capsule   sx ongoing for several weeks, worsening, hx of recurrent sinusitis - encouraged pt to test with PCR test to r/o covid, supportive and OTC meds reviewed, pt can start doxycycline if any acute worsening, fever, facial pain If she continues to have sinusitis recurrently will refer to ENT for further eval  -Red flags and when  to present for emergency care or RTC including fever >101.21F, chest pain, shortness of breath, new/worsening/un-resolving symptoms, reviewed with patient at time of visit. Follow up and care instructions discussed and provided in AVS. - I discussed the assessment and treatment plan with the patient. The patient was provided an opportunity to ask questions and all were answered. The patient agreed with the plan and demonstrated an understanding of the instructions.  I provided 20+ minutes of non-face-to-face time during this encounter.  Delsa Grana, PA-C 05/12/20 2:42 PM

## 2020-05-25 ENCOUNTER — Ambulatory Visit
Admission: RE | Admit: 2020-05-25 | Discharge: 2020-05-25 | Disposition: A | Discharge status: Home / Self Care | Payer: Federal, State, Local not specified - PPO | Source: Ambulatory Visit | Attending: Internal Medicine | Admitting: Internal Medicine

## 2020-05-25 ENCOUNTER — Other Ambulatory Visit: Payer: Self-pay

## 2020-05-25 DIAGNOSIS — Z1231 Encounter for screening mammogram for malignant neoplasm of breast: Secondary | ICD-10-CM | POA: Diagnosis not present

## 2020-06-08 ENCOUNTER — Encounter: Payer: Self-pay | Admitting: Family Medicine

## 2020-06-21 ENCOUNTER — Ambulatory Visit: Payer: Federal, State, Local not specified - PPO | Admitting: Orthopedic Surgery

## 2020-06-28 ENCOUNTER — Encounter: Payer: Self-pay | Admitting: Family Medicine

## 2020-06-29 ENCOUNTER — Encounter: Payer: Federal, State, Local not specified - PPO | Admitting: Certified Nurse Midwife

## 2020-06-30 ENCOUNTER — Encounter: Payer: Self-pay | Admitting: Certified Nurse Midwife

## 2020-06-30 ENCOUNTER — Ambulatory Visit: Payer: Federal, State, Local not specified - PPO | Admitting: Certified Nurse Midwife

## 2020-06-30 ENCOUNTER — Other Ambulatory Visit: Payer: Self-pay

## 2020-06-30 VITALS — BP 117/79 | HR 78 | Ht 66.0 in | Wt 205.1 lb

## 2020-06-30 DIAGNOSIS — Z975 Presence of (intrauterine) contraceptive device: Secondary | ICD-10-CM | POA: Diagnosis not present

## 2020-06-30 DIAGNOSIS — Z30431 Encounter for routine checking of intrauterine contraceptive device: Secondary | ICD-10-CM | POA: Diagnosis not present

## 2020-06-30 NOTE — Progress Notes (Signed)
  GYNECOLOGY OFFICE ENCOUNTER NOTE  History:  45 y.o. G1P1001 here today for today for IUD string check; Mirena  IUD was placed  05/11/2020. No complaints about the IUD, no concerning side effects.  Denies difficulty breathing or respiratory distress, chest pain, abdominal pain, excessive vaginal bleeding, dysuria, and leg pain or swelling.   The following portions of the patient's history were reviewed and updated as appropriate: allergies, current medications, past family history, past medical history, past social history, past surgical history and problem list. Last pap smear on 08/2016 was normal, negative HRHPV.  Review of Systems:  Pertinent items are noted in HPI.   Objective:   BP 117/79   Pulse 78   Ht 5\' 6"  (1.676 m)   Wt 205 lb 1.6 oz (93 kg)   LMP  (LMP Unknown)   BMI 33.10 kg/m   Physical Exam  CONSTITUTIONAL: Well-developed, well-nourished female in no acute distress.   ABDOMEN: Soft, no distention noted.    PELVIC: Normal appearing external genitalia; normal appearing vaginal mucosa and cervix.  IUD strings visualized, about 1 cm in length outside cervix.   Assessment:   IUD check up  IUD in place  Plan:   Encouraged routine health maintenance techniques.   Reviewed red flag symptoms and when to call.   RTC as previously scheduled or sooner if needed   Dani Gobble, CNM [facility], [facility] 06/30/20 10:43 AM

## 2020-06-30 NOTE — Progress Notes (Signed)
Patient comes in today for IUD string check. Patient states that her husband can feel the strings.

## 2020-06-30 NOTE — Patient Instructions (Signed)
Preventive Care 84-46 Years Old, Female Preventive care refers to lifestyle choices and visits with your health care provider that can promote health and wellness. This includes:  A yearly physical exam. This is also called an annual wellness visit.  Regular dental and eye exams.  Immunizations.  Screening for certain conditions.  Healthy lifestyle choices, such as: ? Eating a healthy diet. ? Getting regular exercise. ? Not using drugs or products that contain nicotine and tobacco. ? Limiting alcohol use. What can I expect for my preventive care visit? Physical exam Your health care provider will check your:  Height and weight. These may be used to calculate your BMI (body mass index). BMI is a measurement that tells if you are at a healthy weight.  Heart rate and blood pressure.  Body temperature.  Skin for abnormal spots. Counseling Your health care provider may ask you questions about your:  Past medical problems.  Family's medical history.  Alcohol, tobacco, and drug use.  Emotional well-being.  Home life and relationship well-being.  Sexual activity.  Diet, exercise, and sleep habits.  Work and work Statistician.  Access to firearms.  Method of birth control.  Menstrual cycle.  Pregnancy history. What immunizations do I need? Vaccines are usually given at various ages, according to a schedule. Your health care provider will recommend vaccines for you based on your age, medical history, and lifestyle or other factors, such as travel or where you work.   What tests do I need? Blood tests  Lipid and cholesterol levels. These may be checked every 5 years, or more often if you are over 3 years old.  Hepatitis C test.  Hepatitis B test. Screening  Lung cancer screening. You may have this screening every year starting at age 73 if you have a 30-pack-year history of smoking and currently smoke or have quit within the past 15 years.  Colorectal cancer  screening. ? All adults should have this screening starting at age 52 and continuing until age 17. ? Your health care provider may recommend screening at age 49 if you are at increased risk. ? You will have tests every 1-10 years, depending on your results and the type of screening test.  Diabetes screening. ? This is done by checking your blood sugar (glucose) after you have not eaten for a while (fasting). ? You may have this done every 1-3 years.  Mammogram. ? This may be done every 1-2 years. ? Talk with your health care provider about when you should start having regular mammograms. This may depend on whether you have a family history of breast cancer.  BRCA-related cancer screening. This may be done if you have a family history of breast, ovarian, tubal, or peritoneal cancers.  Pelvic exam and Pap test. ? This may be done every 3 years starting at age 10. ? Starting at age 11, this may be done every 5 years if you have a Pap test in combination with an HPV test. Other tests  STD (sexually transmitted disease) testing, if you are at risk.  Bone density scan. This is done to screen for osteoporosis. You may have this scan if you are at high risk for osteoporosis. Talk with your health care provider about your test results, treatment options, and if necessary, the need for more tests. Follow these instructions at home: Eating and drinking  Eat a diet that includes fresh fruits and vegetables, whole grains, lean protein, and low-fat dairy products.  Take vitamin and mineral supplements  as recommended by your health care provider.  Do not drink alcohol if: ? Your health care provider tells you not to drink. ? You are pregnant, may be pregnant, or are planning to become pregnant.  If you drink alcohol: ? Limit how much you have to 0-1 drink a day. ? Be aware of how much alcohol is in your drink. In the [place] one drink equals one 12 oz bottle of beer (355 mL), one 5 oz glass of  wine (148 mL), or one 1 oz glass of hard liquor (44 mL).   Lifestyle  Take daily care of your teeth and gums. Brush your teeth every morning and night with fluoride toothpaste. Floss one time each day.  Stay active. Exercise for at least 30 minutes 5 or more days each week.  Do not use any products that contain nicotine or tobacco, such as cigarettes, e-cigarettes, and chewing tobacco. If you need help quitting, ask your health care provider.  Do not use drugs.  If you are sexually active, practice safe sex. Use a condom or other form of protection to prevent STIs (sexually transmitted infections).  If you do not wish to become pregnant, use a form of birth control. If you plan to become pregnant, see your health care provider for a prepregnancy visit.  If told by your health care provider, take low-dose aspirin daily starting at age 34.  Find healthy ways to cope with stress, such as: ? Meditation, yoga, or listening to music. ? Journaling. ? Talking to a trusted person. ? Spending time with friends and family. Safety  Always wear your seat belt while driving or riding in a vehicle.  Do not drive: ? If you have been drinking alcohol. Do not ride with someone who has been drinking. ? When you are tired or distracted. ? While texting.  Wear a helmet and other protective equipment during sports activities.  If you have firearms in your house, make sure you follow all gun safety procedures. What's next?  Visit your health care provider once a year for an annual wellness visit.  Ask your health care provider how often you should have your eyes and teeth checked.  Stay up to date on all vaccines. This information is not intended to replace advice given to you by your health care provider. Make sure you discuss any questions you have with your health care provider. Document Revised: 01/04/2020 Document Reviewed: 12/11/2017 Elsevier Patient Education  2021 [place].    Levonorgestrel intrauterine device (IUD) What is this medicine? LEVONORGESTREL IUD (LEE voe nor jes trel) is a contraceptive (birth control) device. The device is placed inside the uterus by a health care provider. It is used to prevent pregnancy. Some devices can also be used to treat heavy bleeding that occurs during your period. This medicine may be used for other purposes; ask your health care provider or pharmacist if you have questions. COMMON BRAND NAME(S): Minette Headland What should I tell my health care provider before I take this medicine? They need to know if you have any of these conditions:  abnormal Pap smear  cancer of the breast, uterus, or cervix  diabetes  endometritis  genital or pelvic infection now or in the past  have more than one sexual partner or your partner has more than one partner  heart disease  history of an ectopic or tubal pregnancy  immune system problems  IUD in place  liver disease or tumor  problems  with blood clots or take blood-thinners  seizures  use intravenous drugs  uterus of unusual shape  vaginal bleeding that has not been explained  an unusual or allergic reaction to levonorgestrel, other hormones, silicone, or polyethylene, medicines, foods, dyes, or preservatives  pregnant or trying to get pregnant  breast-feeding How should I use this medicine? This device is placed inside the uterus by a health care professional. Talk to your pediatrician regarding the use of this medicine in children. Special care may be needed. Overdosage: If you think you have taken too much of this medicine contact a poison control center or emergency room at once. NOTE: This medicine is only for you. Do not share this medicine with others. What if I miss a dose? This does not apply. Depending on the brand of device you have inserted, the device will need to be replaced every 3 to 7 years if you wish to continue using  this type of birth control. What may interact with this medicine? Do not take this medicine with any of the following medications:  amprenavir  bosentan  fosamprenavir This medicine may also interact with the following medications:  aprepitant  armodafinil  barbiturate medicines for inducing sleep or treating seizures  bexarotene  boceprevir  griseofulvin  medicines to treat seizures like carbamazepine, ethotoin, felbamate, oxcarbazepine, phenytoin, topiramate  modafinil  pioglitazone  rifabutin  rifampin  rifapentine  some medicines to treat HIV infection like atazanavir, efavirenz, indinavir, lopinavir, nelfinavir, tipranavir, ritonavir  St. John's wort  warfarin This list may not describe all possible interactions. Give your health care provider a list of all the medicines, herbs, non-prescription drugs, or dietary supplements you use. Also tell them if you smoke, drink alcohol, or use illegal drugs. Some items may interact with your medicine. What should I watch for while using this medicine? Visit your doctor or health care professional for regular check ups. See your doctor if you or your partner has sexual contact with others, becomes HIV positive, or gets a sexual transmitted disease. This product does not protect you against HIV infection (AIDS) or other sexually transmitted diseases. You can check the placement of the IUD yourself by reaching up to the top of your vagina with clean fingers to feel the threads. Do not pull on the threads. It is a good habit to check placement after each menstrual period. Call your doctor right away if you feel more of the IUD than just the threads or if you cannot feel the threads at all. The IUD may come out by itself. You may become pregnant if the device comes out. If you notice that the IUD has come out use a backup birth control method like condoms and call your health care provider. Using tampons will not change the  position of the IUD and are okay to use during your period. This IUD can be safely scanned with magnetic resonance imaging (MRI) only under specific conditions. Before you have an MRI, tell your healthcare provider that you have an IUD in place, and which type of IUD you have in place. What side effects may I notice from receiving this medicine? Side effects that you should report to your doctor or health care professional as soon as possible:  allergic reactions like skin rash, itching or hives, swelling of the face, lips, or tongue  fever, flu-like symptoms  genital sores  high blood pressure  no menstrual period for 6 weeks during use  pain, swelling, warmth in the leg  pelvic pain or tenderness  severe or sudden headache  signs of pregnancy  stomach cramping  sudden shortness of breath  trouble with balance, talking, or walking  unusual vaginal bleeding, discharge  yellowing of the eyes or skin Side effects that usually do not require medical attention (report to your doctor or health care professional if they continue or are bothersome):  acne  breast pain  change in sex drive or performance  changes in weight  cramping, dizziness, or faintness while the device is being inserted  headache  irregular menstrual bleeding within first 3 to 6 months of use  nausea This list may not describe all possible side effects. Call your doctor for medical advice about side effects. You may report side effects to FDA at 1-800-FDA-1088. Where should I keep my medicine? This does not apply. NOTE: This sheet is a summary. It may not cover all possible information. If you have questions about this medicine, talk to your doctor, pharmacist, or health care provider.  2021 Elsevier/Gold Standard (2019-11-30 16:27:45)

## 2020-07-06 ENCOUNTER — Ambulatory Visit (INDEPENDENT_AMBULATORY_CARE_PROVIDER_SITE_OTHER): Payer: Federal, State, Local not specified - PPO | Admitting: Orthopedic Surgery

## 2020-07-06 ENCOUNTER — Other Ambulatory Visit: Payer: Self-pay

## 2020-07-06 DIAGNOSIS — G44309 Post-traumatic headache, unspecified, not intractable: Secondary | ICD-10-CM

## 2020-07-06 DIAGNOSIS — M792 Neuralgia and neuritis, unspecified: Secondary | ICD-10-CM

## 2020-07-06 DIAGNOSIS — S0990XS Unspecified injury of head, sequela: Secondary | ICD-10-CM

## 2020-07-06 DIAGNOSIS — M79601 Pain in right arm: Secondary | ICD-10-CM

## 2020-07-06 DIAGNOSIS — M542 Cervicalgia: Secondary | ICD-10-CM

## 2020-07-08 ENCOUNTER — Encounter: Payer: Self-pay | Admitting: Orthopedic Surgery

## 2020-07-08 NOTE — Progress Notes (Signed)
Office Visit Note   Patient: Courtney Alvarez           Date of Birth: 06/26/1975           MRN: [id number] Visit Date: 07/06/2020 Requested by: No referring provider defined for this encounter. PCP: Towanda Malkin, MD (Inactive)  Subjective: Chief Complaint  Patient presents with  . Neck - Follow-up    HPI: Courtney Alvarez is a 45 y.o. female who presents to the office complaining of neck pain, headache, right arm numbness/tingling.  Patient was seen prior to Covid and had complaints of neck pain and right arm radicular pain with numbness and tingling at that time.  She had an MRI in September 2018 that revealed multilevel degenerative changes of the cervical spine that was worse at C4-C5 and C5-C6 with moderate bilateral neuroforaminal stenosis.  She had a injection in her neck back then at that time that provided no relief whatsoever even for a couple days.  She states that her symptoms have not improved in the last several years.  She complains of "tense muscles" in her bilateral trapezius muscles as well as midline neck pain.  She has some pain that travels down her arm into her fingers and affect her thumb, index, middle, ring finger of her right hand.  She denies any symptoms in her small finger.  She has associated numbness and tingling down her arm as well.  She has never had a nerve study.  Denies any history of neck or back surgery.  Patient also reports worsening headaches over the last several years.  She describes headache in the occiput that travels down into her neck as well as wrapping around into the front and her forehead.  She has a history of migraines when she was younger but these feel distinctly different.  She denies any associated nausea or vomiting but does endorse photophobia/phonophobia.  She has seen a neurologist about 20 years ago and reports she was diagnosed with cluster headaches.  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                ROS: All systems reviewed are negative as they relate to the chief complaint within the history of present illness.  Patient denies fevers or chills.  Assessment & Plan: Visit Diagnoses:  1. Radicular pain in right arm   2. Headaches due to old head trauma     Plan: Patient is a 45 year old female who presents complaining of neck pain and right radicular pain as well as headaches.  She has had neck pain and radicular pain for years with a neck MRI from 2018 that revealed degenerative changes throughout the cervical spine with foraminal stenosis at C4-C5 and C5-C6.  These levels do not explain the numbness and tingling that she has traveling down into her fingertips.  Additionally, she had no relief from cervical spine injection back in 2018 based on that MRI scan.  She feels that her symptoms are worsening.  Given these findings, plan to order MRI of the cervical spine to evaluate for right radiculopathy as well as neck pain/headache.  Refer patient to Dr. Ernestina Patches for nerve conduction study of the right upper extremity for further evaluation of right arm numbness/tingling.  Also plan to refer patient to neurology for evaluation of headaches.  Follow-Up Instructions: No follow-ups on file.   Orders:  No orders of the defined  types were placed in this encounter.  No orders of the defined types were placed in this encounter.     Procedures: No procedures performed   Clinical Data: No additional findings.  Objective: Vital Signs: LMP  (LMP Unknown)   Physical Exam:  Constitutional: Patient appears well-developed HEENT:  Head: Normocephalic Eyes:EOM are normal Neck: Normal range of motion Cardiovascular: Normal rate Pulmonary/chest: Effort normal Neurologic: Patient is alert Skin: Skin is warm Psychiatric: Patient has normal mood and affect  Ortho Exam: Ortho exam demonstrates tenderness throughout the axial cervical spine and  right-sided paraspinal musculature.  She has 5/5 motor strength of bilateral grip strength, finger abduction, pronation/supination, bicep, tricep, deltoid.  Negative Spurling sign.  No significant pain with active motion of the cervical spine.  Negative Phalen's sign.  Negative Tinel's sign.  Negative Durkan sign.  No muscle wasting noted throughout the hand.  No subluxing ulnar nerve of the right elbow.  She does have some tingling into the right small finger with tapping over the ulnar nerve.  Specialty Comments:  No specialty comments available.  Imaging: No results found.   PMFS History: Patient Active Problem List   Diagnosis Date Noted  . IUD (intrauterine device) in place 05/11/2020  . Anxiety with depression 09/09/2019  . Gastroesophageal reflux disease without esophagitis 08/26/2018  . Moderate episode of recurrent major depressive disorder ([place]) 08/26/2018  . GAD (generalized anxiety disorder) 08/26/2018  . Dry eyes 08/26/2018  . Immune to varicella 06/01/2017  . Family history of colonic polyps   . Benign neoplasm of sigmoid colon   . Family history of colon cancer 07/31/2015  . Class 1 obesity due to excess calories without serious comorbidity with body mass index (BMI) of 33.0 to 33.9 in adult 07/31/2015  . Loss of feeling or sensation 06/22/2015  . 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[place]) 12/16/2013  . Arthralgia of multiple joints 12/16/2013  . 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 Adult hypothyroidism 09/07/2013   Past Medical History:  Diagnosis Date  . Arthritis   . Chronic fatigue syndrome   . GERD (gastroesophageal reflux disease)   . Headache    tension/cluster  . Hypothyroidism   . Motion sickness   . Plantar fasciitis of left foot   . PONV (postoperative nausea and vomiting)   . Thyroid disease   . Vaginal Pap smear, abnormal     Family History  Problem Relation Age of Onset  .  Arthritis Mother   . COPD Mother   . Thyroid disease Mother   . Arthritis Father   . Hyperlipidemia Father   . Cancer Father        polyp removed  . Heart disease Father   . Hashimoto's thyroiditis Sister   . Arthritis Maternal Grandmother   . Cancer Maternal Grandmother        colon  . Heart disease Maternal Grandmother   . Thyroid disease Maternal Grandmother   . Alcohol abuse Maternal Grandfather   . Arthritis Maternal Grandfather   . Arthritis Paternal Grandmother   . Depression Paternal Grandmother   . Hypertension Paternal Grandmother   . Stroke Paternal Grandmother   . Arthritis Paternal Grandfather   . Heart disease Paternal Grandfather   . Hypertension Paternal Grandfather   . Breast cancer Other     Past Surgical History:  Procedure Laterality Date  . BLADDER SURGERY    . CERVICAL BIOPSY  W/ LOOP ELECTRODE EXCISION    .  COLONOSCOPY WITH PROPOFOL N/A 01/05/2016   Procedure: COLONOSCOPY WITH PROPOFOL;  Surgeon: Lucilla Lame, MD;  Location: [facility];  Service: Endoscopy;  Laterality: N/A;  . FOOT SURGERY Left   . POLYPECTOMY  01/05/2016   Procedure: POLYPECTOMY;  Surgeon: Lucilla Lame, MD;  Location: [place];  Service: Endoscopy;;   Social History   Occupational History  . Occupation: Therapist, sports  Tobacco Use  . Smoking status: Former Smoker    Quit date: 06/14/2003    Years since quitting: 17.0  . Smokeless tobacco: Never Used  . Tobacco comment: quit 06/2003  Vaping Use  . Vaping Use: Never used  Substance and Sexual Activity  . Alcohol use: Yes    Alcohol/week: 0.0 standard drinks    Comment: occasionally   . Drug use: No  . Sexual activity: Yes    Partners: Male    Birth control/protection: I.U.D.    Comment: mirena

## 2020-07-11 ENCOUNTER — Encounter: Payer: Self-pay | Admitting: Neurology

## 2020-07-11 ENCOUNTER — Other Ambulatory Visit: Payer: Self-pay

## 2020-07-11 ENCOUNTER — Encounter: Payer: Self-pay | Admitting: Orthopedic Surgery

## 2020-07-11 DIAGNOSIS — M542 Cervicalgia: Secondary | ICD-10-CM

## 2020-07-18 DIAGNOSIS — F411 Generalized anxiety disorder: Secondary | ICD-10-CM | POA: Diagnosis not present

## 2020-07-18 DIAGNOSIS — F5105 Insomnia due to other mental disorder: Secondary | ICD-10-CM | POA: Diagnosis not present

## 2020-07-18 DIAGNOSIS — F321 Major depressive disorder, single episode, moderate: Secondary | ICD-10-CM | POA: Diagnosis not present

## 2020-07-29 ENCOUNTER — Ambulatory Visit
Admission: RE | Admit: 2020-07-29 | Discharge: 2020-07-29 | Disposition: A | Discharge status: Home / Self Care | Payer: Federal, State, Local not specified - PPO | Source: Ambulatory Visit | Attending: Orthopedic Surgery | Admitting: Orthopedic Surgery

## 2020-07-29 ENCOUNTER — Other Ambulatory Visit: Payer: Self-pay

## 2020-07-29 DIAGNOSIS — M4802 Spinal stenosis, cervical region: Secondary | ICD-10-CM | POA: Diagnosis not present

## 2020-07-29 DIAGNOSIS — M542 Cervicalgia: Secondary | ICD-10-CM

## 2020-08-02 ENCOUNTER — Ambulatory Visit: Payer: Federal, State, Local not specified - PPO | Admitting: Orthopedic Surgery

## 2020-08-02 ENCOUNTER — Other Ambulatory Visit: Payer: Self-pay

## 2020-08-02 ENCOUNTER — Encounter: Payer: Self-pay | Admitting: Orthopedic Surgery

## 2020-08-02 DIAGNOSIS — M542 Cervicalgia: Secondary | ICD-10-CM | POA: Diagnosis not present

## 2020-08-02 NOTE — Progress Notes (Signed)
Office Visit Note   Patient: Courtney Alvarez           Date of Birth: October 21, 1975           MRN: [id number] Visit Date: 08/02/2020 Requested by: No referring provider defined for this encounter. PCP: Towanda Malkin, MD  Subjective: Chief Complaint  Patient presents with  . Other     Scan review    HPI: Courtney Alvarez is a 45 y.o. female who presents to the office complaining of continued neck pain and radicular pain of the right upper extremity.  She has had multiple years of neck discomfort that wakes her up at night.  She has pain that radiates into her right shoulder blade and right deltoid.  This is associated with tingling throughout the right shoulder.  She also complains of numbness and tingling throughout the entire hand of her right hand with all fingers involved except for the small finger.  She has occasional radiation of this tingling into the forearm.  She feels these 2 areas of symptoms are not connected with differentiation between the numbness and tingling in her hand and forearm versus the neck pain with radiation of pain and tingling into the shoulder.  The neck and shoulder pain bothers her much more than the numbness and tingling in her hand.  She has no history of prior neck surgery.  She has had previous cervical spine epidural steroid injection in 2018 that did not provide lasting relief.  She does take Tylenol for pain that is less than a 5 and if it gets up above a 5 she will consider seeing a chiropractor which she feels helps.  She is here today to review MRI of the cervical spine..                ROS: All systems reviewed are negative as they relate to the chief complaint within the history of present illness.  Patient denies fevers or chills.  Assessment & Plan: Visit Diagnoses:  1. Cervicalgia     Plan: Patient is a 45 year old female who presents with longstanding history of cervical spine pain with radiation into the shoulder as well as numbness and  tingling throughout the distal extremity.  She is here today to review MRI of the cervical spine.  She has mild multilevel cervical spondylosis with moderate right worse than left C5 and C6 foraminal stenosis as well as mild bilateral C7 foraminal stenosis.  Discussed options available to patient.  After discussion, she would like to proceed with cervical spine ESI.  Referred patient to Dr. Laurence Spates.  She also has right upper extremity nerve conduction study that is scheduled with Dr. Ernestina Patches in June.  She has been referred to neurology for evaluation of her headaches and is still awaiting their appointment.  Plan to follow-up 6 weeks after C-spine injection which will likely be around the time she returns to discuss her nerve conduction study results.  Can determine further intervention based on her response to the injection as well as the nerve study.  Patient agreed with this plan.  Follow-Up Instructions: No follow-ups on file.   Orders:  Orders Placed This Encounter  Procedures  . Ambulatory referral to Physical Medicine Rehab   No orders of the defined types were placed in this encounter.     Procedures: No procedures performed   Clinical Data: No additional findings.  Objective: Vital Signs: There were no vitals taken for this visit.  Physical Exam:  Constitutional: Patient appears well-developed HEENT:  Head: Normocephalic Eyes:EOM are normal Neck: Normal range of motion Cardiovascular: Normal rate Pulmonary/chest: Effort normal Neurologic: Patient is alert Skin: Skin is warm Psychiatric: Patient has normal mood and affect  Ortho Exam: Ortho exam demonstrates tenderness throughout the axial cervical spine with mildly increased pain with active cervical spine range of motion.  No significant pain with passive motion of the shoulder.  Excellent rotator cuff strength of all 3 major rotator cuff muscles of the right shoulder.  5/5 motor strength of bilateral grip strength,  finger abduction, pronation/supination, bicep, tricep, deltoid.  No muscle wasting noted throughout the right hand.  EPL, finger abduction, finger adduction is intact.  Negative Tinel sign.  Positive Durkan sign.  No subluxing ulnar nerve noted on exam.  Negative elbow flexion test and negative Tinel at the elbow.  Specialty Comments:  No specialty comments available.  Imaging: No results found.   PMFS History: Patient Active Problem List   Diagnosis Date Noted  . IUD (intrauterine device) in place 05/11/2020  . Anxiety with depression 09/09/2019  . Gastroesophageal reflux disease without esophagitis 08/26/2018  . Moderate episode of recurrent major depressive disorder ([place]) 08/26/2018  . GAD (generalized anxiety disorder) 08/26/2018  . Dry eyes 08/26/2018  . Immune to varicella 06/01/2017  . Family history of colonic polyps   . Benign neoplasm of sigmoid colon   . Family history of colon cancer 07/31/2015  . Class 1 obesity due to excess calories without serious comorbidity with body mass index (BMI) of 33.0 to 33.9 in adult 07/31/2015  . Loss of feeling or sensation 06/22/2015  . 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[place]) 12/16/2013  . Arthralgia of multiple joints 12/16/2013  . 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 Adult hypothyroidism 09/07/2013   Past Medical History:  Diagnosis Date  . Arthritis   . Chronic fatigue syndrome   . GERD (gastroesophageal reflux disease)   . Headache    tension/cluster  . Hypothyroidism   . Motion sickness   . Plantar fasciitis of left foot   . PONV (postoperative nausea and vomiting)   . Thyroid disease   . Vaginal Pap smear, abnormal     Family History  Problem Relation Age of Onset  . Arthritis Mother   . COPD Mother   . Thyroid disease Mother   . Arthritis Father   . Hyperlipidemia Father   . Cancer Father        polyp removed  . Heart disease Father   .  Hashimoto's thyroiditis Sister   . Arthritis Maternal Grandmother   . Cancer Maternal Grandmother        colon  . Heart disease Maternal Grandmother   . Thyroid disease Maternal Grandmother   . Alcohol abuse Maternal Grandfather   . Arthritis Maternal Grandfather   . Arthritis Paternal Grandmother   . Depression Paternal Grandmother   . Hypertension Paternal Grandmother   . Stroke Paternal Grandmother   . Arthritis Paternal Grandfather   . Heart disease Paternal Grandfather   . Hypertension Paternal Grandfather   . Breast cancer Other     Past Surgical History:  Procedure Laterality Date  . BLADDER SURGERY    . CERVICAL BIOPSY  W/ LOOP ELECTRODE EXCISION    . COLONOSCOPY WITH PROPOFOL N/A 01/05/2016   Procedure: COLONOSCOPY WITH PROPOFOL;  Surgeon: Lucilla Lame, MD;  Location: [facility];  Service: Endoscopy;  Laterality: N/A;  .  FOOT SURGERY Left   . POLYPECTOMY  01/05/2016   Procedure: POLYPECTOMY;  Surgeon: Lucilla Lame, MD;  Location: [place];  Service: Endoscopy;;   Social History   Occupational History  . Occupation: Therapist, sports  Tobacco Use  . Smoking status: Former Smoker    Quit date: 06/14/2003    Years since quitting: 17.1  . Smokeless tobacco: Never Used  . Tobacco comment: quit 06/2003  Vaping Use  . Vaping Use: Never used  Substance and Sexual Activity  . Alcohol use: Yes    Alcohol/week: 0.0 standard drinks    Comment: occasionally   . Drug use: No  . Sexual activity: Yes    Partners: Male    Birth control/protection: I.U.D.    Comment: mirena

## 2020-08-06 ENCOUNTER — Encounter: Payer: Self-pay | Admitting: Orthopedic Surgery

## 2020-08-09 ENCOUNTER — Encounter: Payer: Self-pay | Admitting: Orthopedic Surgery

## 2020-08-09 DIAGNOSIS — M792 Neuralgia and neuritis, unspecified: Secondary | ICD-10-CM

## 2020-08-09 DIAGNOSIS — M542 Cervicalgia: Secondary | ICD-10-CM

## 2020-08-10 NOTE — Telephone Encounter (Signed)
Can you please print these forms out.  I am not going to do the headache disposition for him.  She has not had her MRI scan yet.  Regarding permanent limitations it really depends on what the scan shows.

## 2020-08-11 DIAGNOSIS — F321 Major depressive disorder, single episode, moderate: Secondary | ICD-10-CM | POA: Diagnosis not present

## 2020-08-11 DIAGNOSIS — F5105 Insomnia due to other mental disorder: Secondary | ICD-10-CM | POA: Diagnosis not present

## 2020-08-11 DIAGNOSIS — F411 Generalized anxiety disorder: Secondary | ICD-10-CM | POA: Diagnosis not present

## 2020-08-17 ENCOUNTER — Telehealth: Payer: Self-pay

## 2020-08-17 NOTE — Telephone Encounter (Signed)
Patient called she is returning you vm she stated she is at work she gets off at 38, she is off tomorrow patient stated if she misses your call again she will give you a call back tomorrow call [phone]

## 2020-08-17 NOTE — Telephone Encounter (Signed)
Called pt and LVM #1 

## 2020-08-21 NOTE — Telephone Encounter (Signed)
Called pt and LVM #3 

## 2020-08-24 ENCOUNTER — Encounter: Payer: Self-pay | Admitting: Internal Medicine

## 2020-08-28 ENCOUNTER — Telehealth: Payer: Self-pay | Admitting: Physical Medicine and Rehabilitation

## 2020-08-28 NOTE — Telephone Encounter (Signed)
Called pt and inform her she does need a driver.

## 2020-08-28 NOTE — Telephone Encounter (Signed)
Pt called stating she has a neck inj coming up and last time she had to have a driver, she would like a CB from [facility] to let her know if that will be the case for this upcoming time as well?  [phone]

## 2020-08-31 ENCOUNTER — Other Ambulatory Visit: Payer: Self-pay

## 2020-08-31 ENCOUNTER — Ambulatory Visit: Payer: Self-pay | Admitting: Internal Medicine

## 2020-08-31 DIAGNOSIS — E039 Hypothyroidism, unspecified: Secondary | ICD-10-CM

## 2020-08-31 MED ORDER — THYROID 120 MG PO TABS: 120.0000 mg | ORAL_TABLET | Freq: Every day | ORAL | 0 refills | Status: DC

## 2020-08-31 NOTE — Telephone Encounter (Signed)
lvm informing pt that prescription has been sent to pharmacy and for her to schedule appt within the next 2-3 months

## 2020-09-04 ENCOUNTER — Telehealth: Payer: Self-pay

## 2020-09-04 NOTE — Telephone Encounter (Signed)
Can u find form thx

## 2020-09-04 NOTE — Telephone Encounter (Signed)
This is the pt for the paperwork I gave you

## 2020-09-04 NOTE — Telephone Encounter (Signed)
Pt called regarding her form below.

## 2020-09-04 NOTE — Telephone Encounter (Signed)
Pt called and would like a call back to reschedule her appt on the 7th with Dr. Ernestina Patches

## 2020-09-04 NOTE — Telephone Encounter (Signed)
Called and r/s pt

## 2020-09-06 NOTE — Telephone Encounter (Signed)
Thanks.  I think that she was called and we need to do an FCE type evaluation before I can fill out any of that [employer] return to duty work.

## 2020-09-08 NOTE — Telephone Encounter (Signed)
Patient has medical release form in this message as an attachment. I have printed the copy and will place on your desk. Thanks.

## 2020-09-13 ENCOUNTER — Ambulatory Visit (INDEPENDENT_AMBULATORY_CARE_PROVIDER_SITE_OTHER): Payer: Federal, State, Local not specified - PPO | Admitting: Physical Medicine and Rehabilitation

## 2020-09-13 ENCOUNTER — Encounter: Payer: Self-pay | Admitting: Physical Medicine and Rehabilitation

## 2020-09-13 ENCOUNTER — Ambulatory Visit: Payer: Self-pay

## 2020-09-13 ENCOUNTER — Other Ambulatory Visit: Payer: Self-pay

## 2020-09-13 VITALS — BP 118/78 | HR 83

## 2020-09-13 DIAGNOSIS — M5412 Radiculopathy, cervical region: Secondary | ICD-10-CM

## 2020-09-13 MED ORDER — BETAMETHASONE SOD PHOS & ACET 6 (3-3) MG/ML IJ SUSP
12.0000 mg | Freq: Once | INTRAMUSCULAR | Status: AC
  Administered 2020-09-13: 12 mg

## 2020-09-13 NOTE — Progress Notes (Signed)
Courtney Alvarez - 45 y.o. female MRN [id number]  Date of birth: 10/22/1975  Office Visit Note: Visit Date: 09/13/2020 PCP: Towanda Malkin, MD Referred by: Lebron Conners D*  Subjective: No chief complaint on file.  HPI:  Courtney Alvarez is a 45 y.o. female who comes in today at the request of Dr. Anderson Malta for planned Right C7-T1 Cervical Interlaminar epidural steroid injection with fluoroscopic guidance.  The patient has failed conservative care including home exercise, medications, time and activity modification.  This injection will be diagnostic and hopefully therapeutic.  Please see requesting physician notes for further details and justification. MRI reviewed with images and spine model.  MRI reviewed in the note below.    ROS Otherwise per HPI.   Assessment & Plan: Visit Diagnoses:    ICD-10-CM   1. Cervical radiculopathy  M54.12 XR C-ARM NO REPORT    Epidural Steroid injection    betamethasone acetate-betamethasone sodium phosphate (CELESTONE) injection 12 mg    Plan: No additional findings.   Meds & Orders:  Meds ordered this encounter  Medications  . betamethasone acetate-betamethasone sodium phosphate (CELESTONE) injection 12 mg    Orders Placed This Encounter  Procedures  . XR C-ARM NO REPORT  . Epidural Steroid injection    Follow-up: Return if symptoms worsen or fail to improve.   Procedures: No procedures performed  Cervical Epidural Steroid Injection - Interlaminar Approach with Fluoroscopic Guidance  Patient: Courtney Alvarez      Date of Birth: 1975-06-16 MRN: [id number] PCP: Towanda Malkin, MD      Visit Date: 09/13/2020   Universal Protocol:    Date/Time: 09/14/2210:59 PM  Consent Given By: the patient  Position: PRONE  Additional Comments: Vital signs were monitored before and after the procedure. Patient was prepped and draped in the usual sterile fashion. The correct patient, procedure, and site was  verified.   Injection Procedure Details:   Procedure diagnoses: Cervical radiculopathy [M54.12]    Meds Administered:  Meds ordered this encounter  Medications  . betamethasone acetate-betamethasone sodium phosphate (CELESTONE) injection 12 mg     Laterality: Right  Location/Site: C7-T1  Needle: 3.5 in., 20 ga. Tuohy  Needle Placement: Paramedian epidural space  Findings:  -Comments: Excellent flow of contrast into the epidural space.  Procedure Details: Using a paramedian approach from the side mentioned above, the region overlying the inferior lamina was localized under fluoroscopic visualization and the soft tissues overlying this structure were infiltrated with 4 ml. of 1% Lidocaine without Epinephrine. A # 20 gauge, Tuohy needle was inserted into the epidural space using a paramedian approach.  The epidural space was localized using loss of resistance along with contralateral oblique bi-planar fluoroscopic views.  After negative aspirate for air, blood, and CSF, a 2 ml. volume of Isovue-250 was injected into the epidural space and the flow of contrast was observed. Radiographs were obtained for documentation purposes.   The injectate was administered into the level noted above.  Additional Comments:  The patient tolerated the procedure well Dressing: 2 x 2 sterile gauze and Band-Aid    Post-procedure details: Patient was observed during the procedure. Post-procedure instructions were reviewed.  Patient left the clinic in stable conditio    Clinical History: MRI CERVICAL SPINE WITHOUT CONTRAST  TECHNIQUE: Multiplanar, multisequence MR imaging of the cervical spine was performed. No intravenous contrast was administered.  COMPARISON:  Previous MRI from 12/26/2016  FINDINGS: Alignment: Straightening of the normal cervical lordosis. Trace retrolisthesis of C4  on C5, with trace anterolisthesis of C6 on C7, chronic and facet mediated.  Vertebrae: Vertebral  body height maintained without acute or chronic fracture. Bone marrow signal intensity within normal limits. Subcentimeter benign hemangioma noted within the T1 vertebral body. No other discrete or worrisome osseous lesions. No abnormal marrow edema.  Cord: Normal signal and morphology.  Posterior Fossa, vertebral arteries, paraspinal tissues: Visualized brain and posterior fossa within normal limits. Craniocervical junction normal. Paraspinous and prevertebral soft tissues within normal limits. Normal intravascular flow voids seen within the vertebral arteries bilaterally.  Disc levels:  C2-C3: Normal interspace. Left-sided facet hypertrophy. No significant spinal stenosis. Mild left C3 foraminal narrowing. Right neural foramen remains patent.  C3-C4: Mild annular disc bulge with uncovertebral hypertrophy. Mild left-sided facet hypertrophy. No spinal stenosis. Mild to moderate left C4 foraminal stenosis. Right neural foramina remains patent.  C4-C5: Trace retrolisthesis. Broad-based posterior disc bulge flattens and partially effaces the ventral thecal sac, eccentric to the right. No significant spinal stenosis or cord deformity. Moderate right worse than left C5 foraminal stenosis.  C5-C6: Broad-based posterior disc bulge flattens and partially effaces the ventral thecal sac without significant spinal stenosis or cord impingement. Moderate bilateral C6 foraminal narrowing, slightly worse on the right.  C6-C7: Trace anterolisthesis. Mild disc bulge with uncovertebral hypertrophy. Flattening of the ventral thecal sac without significant spinal stenosis. Mild bilateral C7 foraminal stenosis.  C7-T1:  Normal interspace.  Mild facet hypertrophy.  No stenosis.  Visualized upper thoracic spine demonstrates no significant finding.  IMPRESSION: 1. Mild multilevel cervical spondylosis without significant spinal stenosis. 2. Multifactorial degenerative changes with  resultant multilevel foraminal narrowing as above. Notable findings include mild left C3 foraminal stenosis, mild to moderate left C4 foraminal narrowing, moderate right worse than left C5 and C6 foraminal stenosis, with mild bilateral C7 foraminal narrowing.   Electronically Signed   By: Jeannine Boga M.D.   On: 07/29/2020 20:19     Objective:  VS:  HT:    WT:   BMI:     BP:   HR: bpm  TEMP: ( )  RESP:  Physical Exam Vitals and nursing note reviewed.  Constitutional:      General: She is not in acute distress.    Appearance: Normal appearance. She is not ill-appearing.  HENT:     Head: Normocephalic and atraumatic.     Right Ear: External ear normal.     Left Ear: External ear normal.  Eyes:     Extraocular Movements: Extraocular movements intact.  Cardiovascular:     Rate and Rhythm: Normal rate.     Pulses: Normal pulses.  Musculoskeletal:     Cervical back: Tenderness present. No rigidity.     Right lower leg: No edema.     Left lower leg: No edema.     Comments: Patient has good strength in the upper extremities including 5 out of 5 strength in wrist extension long finger flexion and APB.  There is no atrophy of the hands intrinsically.  There is a negative Hoffmann's test.   Lymphadenopathy:     Cervical: No cervical adenopathy.  Skin:    Findings: No erythema, lesion or rash.  Neurological:     General: No focal deficit present.     Mental Status: She is alert and oriented to person, place, and time.     Sensory: No sensory deficit.     Motor: No weakness or abnormal muscle tone.     Coordination: Coordination normal.  Psychiatric:  Mood and Affect: Mood normal.        Behavior: Behavior normal.      Imaging: No results found.

## 2020-09-13 NOTE — Procedures (Signed)
Cervical Epidural Steroid Injection - Interlaminar Approach with Fluoroscopic Guidance  Patient: Courtney Alvarez      Date of Birth: 03-13-1976 MRN: [id number] PCP: Towanda Malkin, MD      Visit Date: 09/13/2020   Universal Protocol:    Date/Time: 09/14/2210:59 PM  Consent Given By: the patient  Position: PRONE  Additional Comments: Vital signs were monitored before and after the procedure. Patient was prepped and draped in the usual sterile fashion. The correct patient, procedure, and site was verified.   Injection Procedure Details:   Procedure diagnoses: Cervical radiculopathy [M54.12]    Meds Administered:  Meds ordered this encounter  Medications  . betamethasone acetate-betamethasone sodium phosphate (CELESTONE) injection 12 mg     Laterality: Right  Location/Site: C7-T1  Needle: 3.5 in., 20 ga. Tuohy  Needle Placement: Paramedian epidural space  Findings:  -Comments: Excellent flow of contrast into the epidural space.  Procedure Details: Using a paramedian approach from the side mentioned above, the region overlying the inferior lamina was localized under fluoroscopic visualization and the soft tissues overlying this structure were infiltrated with 4 ml. of 1% Lidocaine without Epinephrine. A # 20 gauge, Tuohy needle was inserted into the epidural space using a paramedian approach.  The epidural space was localized using loss of resistance along with contralateral oblique bi-planar fluoroscopic views.  After negative aspirate for air, blood, and CSF, a 2 ml. volume of Isovue-250 was injected into the epidural space and the flow of contrast was observed. Radiographs were obtained for documentation purposes.   The injectate was administered into the level noted above.  Additional Comments:  The patient tolerated the procedure well Dressing: 2 x 2 sterile gauze and Band-Aid    Post-procedure details: Patient was observed during the  procedure. Post-procedure instructions were reviewed.  Patient left the clinic in stable conditio

## 2020-09-13 NOTE — Patient Instructions (Signed)

## 2020-09-13 NOTE — Progress Notes (Signed)
Pt state neck pain that travels to her head. Pt state any movement makes the pain worse. Pt state she takes pain meds and heating pads to help ease her pain.  Numeric Pain Rating Scale and Functional Assessment Average Pain 5   In the last MONTH (on 0-10 scale) has pain interfered with the following?  1. General activity like being  able to carry out your everyday physical activities such as walking, climbing stairs, carrying groceries, or moving a chair?  Rating(10)   +Driver, -BT, -Dye Allergies.

## 2020-09-15 NOTE — Progress Notes (Signed)
NEUROLOGY CONSULTATION NOTE  Courtney Alvarez MRN: [id number] DOB: 1975-05-01  Referring provider: Meredith Pel, MD Primary care provider: Lebron Conners, MD  Reason for consult:  headache  Assessment/Plan:   1.  Migraine without aura, without status migrainosus, not intractable 2.  Cervicalgia 3.  Cervical spondylosis  1.  Migraine prevention:  Emgality every 28 days 2.  Migraine rescue;  Tizanidine 2-4mg  at onset of neck pain; Ubrelvy 100mg  at onset of headache 3.  Limit use of pain relievers to no more than 2 days out of week to prevent risk of rebound or medication-overuse headache. 4.  Keep headache diary 5.  Follow up 6 months.   Subjective:  Courtney Alvarez is a 45 year old right-handed female with hypothyroidism and chronic fatigue syndrome who presents for headache.  History supplemented by referring provider's note.  She reports migraines as a child.  She had previous head CTs.  A neurologist at that time diagnosed her with "cluster headache".  They subsequently subsided.  She started experiencing recurrence of these headaches again in 2018.  She describes moderate to severe throbbing and aching pain starting in the shoulders and radiating up the posterior neck to back of head wrapping to the front.  There is associated photophobia and phonophobia but no nausea, vomiting, or visual disturbance (when she was younger, migraines were accompanied by nausea).  They would last 1 to 2 days and occur about once a week.  Triggers include stress.  Massage, chiropractic medication and mobility help relieve them.  In addition, she experienced numbness and pain down the right arm.  She had an MRI of the cervical spine on 12/26/2016, which demonstrated multilevel degenerative changes, more pronounced at C4-5 and C5-6 with moderate bilateral neuroforaminal stenosis.  Epidural injection at that time was ineffective.  She followed up with orthopedics.  Repeat MRI of the cervical spine on  07/29/2020 showed mild multilevel spondylosis with moderate right worse than left C5 and C6 foraminal stenosis and mild bilateral C7 foraminal stenosis.  She underwent right C7-T1 epidural injection on 09/13/2020.  Pain is still there but it may take up to 2 weeks to feel the effect  Current NSAIDS/analgesics:  Treats with Tylenol or Tylenol Tension Current triptans:  none Current ergotamine:  none Current anti-emetic:  Zofran-ODT 4mg  Current muscle relaxants:  none Current Antihypertensive medications:  none Current Antidepressant medications:  Fluoxetine 60mg  QD, venlafaxine XR 150mg  QD Current Anticonvulsant medications:  none Current anti-CGRP:  none Current Vitamins/Herbal/Supplements:  MVI, B12, Omega-3 Current Antihistamines/Decongestants:  none Other therapy:  Chiropractor at least once a month, massage Hormone/birth control:  Mirena Other medications:  Restoril, Thyroid  Past NSAIDS/analgesics:  Diclofenac tab, tramadol Past abortive triptans:  Sumatriptan [place], Maxalt-MLT Past abortive ergotamine:  none Past muscle relaxants:  Robaxin Past anti-emetic:  none Past antihypertensive medications:  none Past antidepressant medications:  Wellbutrin Past anticonvulsant medications:  Gabapentin (drowsiness) Past anti-CGRP:  none Past vitamins/Herbal/Supplements:  none Past antihistamines/decongestants:  none Other past therapies:  none  Caffeine:  3-4 cups coffee daily.   Diet:  Tries to drink 64oz or more daily of water.  Does not skip meals.  On the [place] diet Exercise:  routine Depression:  controlled; Anxiety:  improved Other pain:  Back pain Sleep:  good Family history of headache:  Paternal grandfather  PAST MEDICAL HISTORY: Past Medical History:  Diagnosis Date  . Arthritis   . Chronic fatigue syndrome   . GERD (gastroesophageal reflux disease)   . Headache  tension/cluster  . Hypothyroidism   . Motion sickness   . Plantar fasciitis of left foot   . PONV  (postoperative nausea and vomiting)   . Thyroid disease   . Vaginal Pap smear, abnormal     PAST SURGICAL HISTORY: Past Surgical History:  Procedure Laterality Date  . BLADDER SURGERY    . CERVICAL BIOPSY  W/ LOOP ELECTRODE EXCISION    . COLONOSCOPY WITH PROPOFOL N/A 01/05/2016   Procedure: COLONOSCOPY WITH PROPOFOL;  Surgeon: Lucilla Lame, MD;  Location: [facility];  Service: Endoscopy;  Laterality: N/A;  . FOOT SURGERY Left   . POLYPECTOMY  01/05/2016   Procedure: POLYPECTOMY;  Surgeon: Lucilla Lame, MD;  Location: [facility];  Service: Endoscopy;;    MEDICATIONS: Current Outpatient Medications on File Prior to Visit  Medication Sig Dispense Refill  . calcium carbonate (TUMS - DOSED IN MG ELEMENTAL CALCIUM) 500 MG chewable tablet Chew 1 tablet by mouth daily.    . Cholecalciferol (PA VITAMIN D-3 GUMMY PO) Take 2 tablets by mouth daily.     . Crisaborole 2 % OINT Apply TO AFFECTED AREA(S) OF RASH ON ARMS EVERY DAY AS NEEDED    . cyanocobalamin 1000 MCG tablet Take 1,000 mcg by mouth daily.    Marland Kitchen doxycycline (VIBRAMYCIN) 100 MG capsule Take 1 capsule (100 mg total) by mouth 2 (two) times daily. 20 capsule 0  . FLUoxetine HCl 60 MG TABS Take 60 mg by mouth every morning.     . lansoprazole (PREVACID) 15 MG capsule     . levonorgestrel (MIRENA) 20 MCG/24HR IUD 1 each by Intrauterine route once.    . Multiple Vitamins-Minerals (MULTIVITAMIN GUMMIES WOMENS) CHEW Chew 2 tablets by mouth daily.     . Omega-3 1000 MG CAPS Take by mouth.    . ondansetron (ZOFRAN-ODT) 4 MG disintegrating tablet Take 1 tablet (4 mg total) by mouth every 8 (eight) hours as needed for nausea or vomiting. 20 tablet 0  . temazepam (RESTORIL) 15 MG capsule Take 15 mg by mouth at bedtime as needed.    . thyroid (NP THYROID) 120 MG tablet Take 1 tablet (120 mg total) by mouth daily before breakfast. 90 tablet 0  . venlafaxine XR (EFFEXOR-XR) 150 MG 24 hr capsule TK 1 C PO D  1  . XIIDRA 5 % SOLN Place 1  drop into both eyes 2 (two) times daily.     No current facility-administered medications on file prior to visit.    ALLERGIES: No Known Allergies  FAMILY HISTORY: Family History  Problem Relation Age of Onset  . Arthritis Mother   . COPD Mother   . Thyroid disease Mother   . Arthritis Father   . Hyperlipidemia Father   . Cancer Father        polyp removed  . Heart disease Father   . Hashimoto's thyroiditis Sister   . Arthritis Maternal Grandmother   . Cancer Maternal Grandmother        colon  . Heart disease Maternal Grandmother   . Thyroid disease Maternal Grandmother   . Alcohol abuse Maternal Grandfather   . Arthritis Maternal Grandfather   . Arthritis Paternal Grandmother   . Depression Paternal Grandmother   . Hypertension Paternal Grandmother   . Stroke Paternal Grandmother   . Arthritis Paternal Grandfather   . Heart disease Paternal Grandfather   . Hypertension Paternal Grandfather   . Breast cancer Other     Objective:  Blood pressure 116/81, pulse 80, height  5\' 6"  (1.676 m), weight 193 lb 9.6 oz (87.8 kg), SpO2 95 %. General: No acute distress.  Patient appears well-groomed.   Head:  Normocephalic/atraumatic Eyes:  fundi examined but not visualized Neck: supple, mild bilateral paraspinal tenderness, full range of motion Back: No paraspinal tenderness Heart: regular rate and rhythm Lungs: Clear to auscultation bilaterally. Vascular: No carotid bruits. Neurological Exam: Mental status: alert and oriented to person, place, and time, recent and remote memory intact, fund of knowledge intact, attention and concentration intact, speech fluent and not dysarthric, language intact. Cranial nerves: CN I: not tested CN II: pupils equal, round and reactive to light, visual fields intact CN III, IV, VI:  full range of motion, no nystagmus, no ptosis CN V: facial sensation intact. CN VII: upper and lower face symmetric CN VIII: hearing intact CN IX, X: gag intact,  uvula midline CN XI: sternocleidomastoid and trapezius muscles intact CN XII: tongue midline Bulk & Tone: normal, no fasciculations. Motor:  muscle strength 5/5 throughout Sensation:  Pinprick, temperature and vibratory sensation intact. Deep Tendon Reflexes:  2+ throughout,  toes downgoing.   Finger to nose testing:  Without dysmetria.   Heel to shin:  Without dysmetria.   Gait:  Normal station and stride.  Romberg negative.    Thank you for allowing me to take part in the care of this patient.  Metta Clines, DO  CC:  Lebron Conners, MD  Marcene Duos, MD

## 2020-09-18 ENCOUNTER — Encounter: Payer: Self-pay | Admitting: Neurology

## 2020-09-18 ENCOUNTER — Other Ambulatory Visit: Payer: Self-pay

## 2020-09-18 ENCOUNTER — Ambulatory Visit: Payer: Federal, State, Local not specified - PPO | Admitting: Neurology

## 2020-09-18 VITALS — BP 116/81 | HR 80 | Ht 66.0 in | Wt 193.6 lb

## 2020-09-18 DIAGNOSIS — M47812 Spondylosis without myelopathy or radiculopathy, cervical region: Secondary | ICD-10-CM | POA: Diagnosis not present

## 2020-09-18 DIAGNOSIS — M542 Cervicalgia: Secondary | ICD-10-CM

## 2020-09-18 DIAGNOSIS — G43009 Migraine without aura, not intractable, without status migrainosus: Secondary | ICD-10-CM | POA: Diagnosis not present

## 2020-09-18 MED ORDER — EMGALITY 120 MG/ML ~~LOC~~ SOAJ: 120.0000 mg | SUBCUTANEOUS | 5 refills | Status: DC

## 2020-09-18 MED ORDER — TIZANIDINE HCL 4 MG PO TABS: ORAL_TABLET | ORAL | 5 refills | Status: DC

## 2020-09-18 NOTE — Patient Instructions (Signed)
  1. Start Emgality every 28 days.  First dose consists of two injections.  Then 1 injection every 28 days thereafter.   2. At earliest onset of neck pain, take tizanidine 1/2 tablet to 1 tablet.  Caution for drowsiness 3. Take Ubrelvy at earliest onset of headache.  May repeat dose once in 2 hours if needed.  Maximum 2 tablets in 24 hours.  If effective, contact me for prescription 4. Limit use of pain relievers to no more than 2 days out of the week.  These medications include acetaminophen, NSAIDs (ibuprofen/Advil/Motrin, naproxen/Aleve, triptans (Imitrex/sumatriptan), Excedrin, and narcotics.  This will help reduce risk of rebound headaches. 5. Be aware of common food triggers:  - Caffeine:  coffee, black tea, cola, Mt. Dew  - Chocolate  - Dairy:  aged cheeses (brie, blue, cheddar, gouda, [place], provolone, [place], Swiss, etc), chocolate milk, buttermilk, sour cream, limit eggs and yogurt  - Nuts, peanut butter  - Alcohol  - Cereals/grains:  FRESH breads (fresh bagels, sourdough, doughnuts), yeast productions  - Processed/canned/aged/cured meats (pre-packaged deli meats, hotdogs)  - MSG/glutamate:  soy sauce, flavor enhancer, pickled/preserved/marinated foods  - Sweeteners:  aspartame (Equal, Nutrasweet).  Sugar and Splenda are okay  - Vegetables:  legumes (lima beans, lentils, snow peas, fava beans, pinto peans, peas, garbanzo beans), sauerkraut, onions, olives, pickles  - Fruit:  avocados, bananas, citrus fruit (orange, lemon, grapefruit), mango  - Other:  Frozen meals, macaroni and cheese 6. Routine exercise 7. Stay adequately hydrated (aim for 64 oz water daily) 8. Keep headache diary 9. Maintain proper stress management 10. Maintain proper sleep hygiene 11. Do not skip meals 12. Consider supplements:  magnesium citrate 400mg  daily, riboflavin 400mg  daily, coenzyme Q10 100mg  three times daily.

## 2020-09-19 ENCOUNTER — Encounter: Payer: Federal, State, Local not specified - PPO | Admitting: Physical Medicine and Rehabilitation

## 2020-10-03 ENCOUNTER — Other Ambulatory Visit: Payer: Self-pay

## 2020-10-03 ENCOUNTER — Ambulatory Visit: Payer: Federal, State, Local not specified - PPO | Attending: Orthopedic Surgery

## 2020-10-03 DIAGNOSIS — M792 Neuralgia and neuritis, unspecified: Secondary | ICD-10-CM | POA: Insufficient documentation

## 2020-10-03 DIAGNOSIS — M542 Cervicalgia: Secondary | ICD-10-CM | POA: Insufficient documentation

## 2020-10-03 NOTE — Therapy (Addendum)
Courtney Alvarez, [place], [address] Phone: [phone]   Fax:  [phone]  Physical Therapy Evaluation  Patient Details  Name: Courtney Alvarez MRN: [id number] Date of Birth: Sep 01, 1975 Referring Provider (PT): [place], [place]   Encounter Date: 10/03/2020   PT End of Session - 10/03/20 1527     Visit Number 1    Number of Visits 1    PT Start Time 1200    PT Stop Time 1515    PT Time Calculation (min) 195 min    Activity Tolerance Patient limited by pain    Behavior During Therapy [facility] for tasks assessed/performed             Past Medical History:  Diagnosis Date   Arthritis    Chronic fatigue syndrome    GERD (gastroesophageal reflux disease)    Headache    tension/cluster   Hypothyroidism    Motion sickness    Plantar fasciitis of left foot    PONV (postoperative nausea and vomiting)    Thyroid disease    Vaginal Pap smear, abnormal     Past Surgical History:  Procedure Laterality Date   BLADDER SURGERY     CERVICAL BIOPSY  W/ LOOP ELECTRODE EXCISION     COLONOSCOPY WITH PROPOFOL N/A 01/05/2016   Procedure: COLONOSCOPY WITH PROPOFOL;  Surgeon: Lucilla Lame, MD;  Location: [place];  Service: Endoscopy;  Laterality: N/A;   FOOT SURGERY Left    POLYPECTOMY  01/05/2016   Procedure: POLYPECTOMY;  Surgeon: Lucilla Lame, MD;  Location: [place];  Service: Endoscopy;;    There were no vitals filed for this visit.    Subjective Assessment - 10/03/20 1524     Subjective She reports she needs medical forms filled out and MD needs FCE to address this.    Pertinent History Pt has had symptoms in cervical spine for several years, with increase of symptoms beginning over the last 5 years.  Pt's MRI results revealed disc bulging of C4-5, C5-6 and C6-7.  Pt has hx of headaches, with symptoms increasing as neck pain increases.  Pt has seen a chiropracter in the past for cervical and  lumbar pain.  Pt has hx of low back pain and currently has plantarfascitis of L foot.  Pt has past medical hx of arthritis, chronic fatigue syndrome, GERD, headaches, hypothroidism, and thyroid disease.                [facility] PT Assessment - 10/03/20 0001       Assessment   Medical Diagnosis cervicalgia, Radicular pain    Referring Provider (PT) DEAN, GREGORY SCOTT    Onset Date/Surgical Date --   2006 head aches   Hand Dominance Right      Precautions   Precautions None      Cognition   Overall Cognitive Status Within Functional Limits for tasks assessed                        Objective measurements completed on examination: See ERGOSCIENCE FCE faxed to Dr dean's office and scanned into EPIC.               PT Education - 10/03/20 1527     Education Details Basic FCE results reviewed with [name]) Educated Patient    Methods Explanation    Comprehension Verbalized understanding  PT Short Term Goals - 03/12/17 0859       PT SHORT TERM GOAL #1   Title Pt will demonstrate cervical ROM WFL's without pain or radiating symptoms down R UE.    Baseline Pt limited in all planes with increased symptoms in R UE with flexion, R rotation and R sidebending    Time 4    Period Weeks    Status On-going    Target Date 03/26/17      PT SHORT TERM GOAL #2   Title Pt will indicate pain of </= 5/10 with activity and cervical movement, in order to demonstrate improved function within pain free range.    Baseline ; 4/10 03/12/17    Time 4    Period Weeks    Status On-going    Target Date 03/26/17      PT SHORT TERM GOAL #3   Title Pt will demonstrate improved NDI score of <or= to 20% indicating min-mod disability in order to demonstrate self-reported improvement of pain and mobility.    Baseline 03/03/17 38%    Time 4    Period Weeks    Status On-going    Target Date 03/26/17               PT Long Term Goals -  03/12/17 0858       PT LONG TERM GOAL #1   Title Pt will demonstrate compliance with HEP in order to manage pain and symptoms.    Time 8    Period Weeks    Status On-going    Target Date 03/26/17      PT LONG TERM GOAL #2   Title Pt will indicate pain levels of <or= 2/10 with activity in order to demonstrate improved functional mobility without pain.    Baseline Worst: 10/10, Best: 5/10, 11/19/`18 6/10, 03/12/17 4/10    Time 8    Period Weeks    Status On-going    Target Date 03/26/17      PT LONG TERM GOAL #3   Title Pt will demonstrate improve strength of postural musculature in order to facilitate proper sitting and standing posture, allowing for improvment with mobility and daily tasks.    Baseline 01/29/17: L rhomboid and middle trap = 4/5, R rhombodi and middle trap = 4-/5, B lower trap = 3+/5    Time 8    Period Weeks    Status On-going    Target Date 03/26/17      PT LONG TERM GOAL #5   Title Pt will improve NDI score to <or= to 10% indicating minimal disability level in order to demonstrate self-reported improvement of pain and function.    Baseline 01/29/17: 38% indicating moderate disability    Time 8    Period Weeks    Status On-going                    Plan - 10/03/20 1529     Clinical Impression Statement Report will be faxed to Dr Marlou Sa. Overall rating of light duty work. She self limited due to pain so  the reslts may be her minimum rather than her maximum limits             Patient will benefit from skilled therapeutic intervention in order to improve the following deficits and impairments:     Visit Diagnosis: Cervicalgia  Radicular pain in right arm     Problem List Patient Active Problem List   Diagnosis Date Noted  IUD (intrauterine device) in place 05/11/2020   Anxiety with depression 09/09/2019   Gastroesophageal reflux disease without esophagitis 08/26/2018   Moderate episode of recurrent major depressive disorder ([place])  08/26/2018   GAD (generalized anxiety disorder) 08/26/2018   Dry eyes 08/26/2018   Immune to varicella 06/01/2017   Family history of colonic polyps    Benign neoplasm of sigmoid colon    Family history of colon cancer 07/31/2015   Class 1 obesity due to excess calories without serious comorbidity with body mass index (BMI) of 33.0 to 33.9 in adult 07/31/2015   Loss of feeling or sensation 06/22/2015   Chronic pain in left foot 06/22/2015   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[place]) 12/16/2013   Arthralgia of multiple joints 12/16/2013   Goiter, nontoxic, 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   Adult hypothyroidism 09/07/2013    Darrel Hoover  PT 10/03/2020, 3:35 PM  [facility] [address] [place], [place], [address] Phone: [phone]   Fax:  [phone]  Name: CHARDAY CAPETILLO MRN: [id number] Date of Birth: 1975/09/09      Pt discharged due to not returning since evaluation.   Kristoffer Leamon PT, DPT, LAT, ATC  11/15/20  3:25 PM

## 2020-10-06 ENCOUNTER — Telehealth: Payer: Self-pay

## 2020-10-06 NOTE — Telephone Encounter (Signed)
Left vm to screen for 10/09/20 appointment-Toni 

## 2020-10-09 ENCOUNTER — Encounter: Payer: Self-pay | Admitting: Internal Medicine

## 2020-10-09 ENCOUNTER — Other Ambulatory Visit: Payer: Self-pay

## 2020-10-09 ENCOUNTER — Ambulatory Visit: Payer: Federal, State, Local not specified - PPO | Admitting: Internal Medicine

## 2020-10-09 VITALS — BP 104/72 | HR 75 | Temp 98.4°F | Resp 16 | Ht 66.0 in | Wt 192.0 lb

## 2020-10-09 DIAGNOSIS — F418 Other specified anxiety disorders: Secondary | ICD-10-CM

## 2020-10-09 DIAGNOSIS — F339 Major depressive disorder, recurrent, unspecified: Secondary | ICD-10-CM

## 2020-10-09 DIAGNOSIS — G479 Sleep disorder, unspecified: Secondary | ICD-10-CM | POA: Diagnosis not present

## 2020-10-09 DIAGNOSIS — G4719 Other hypersomnia: Secondary | ICD-10-CM | POA: Diagnosis not present

## 2020-10-09 NOTE — Patient Instructions (Signed)
Sleep Apnea Sleep apnea affects breathing during sleep. It causes breathing to stop for 10 seconds or more, or to become shallow. People with sleep apnea usually snoreloudly. It can also increase the risk of: Heart attack. Stroke. Being very overweight (obese). Diabetes. Heart failure. Irregular heartbeat. High blood pressure. The goal of treatment is to help you breathe normally again. What are the causes?  The most common cause of this condition is a collapsed or blocked airway. There are three kinds of sleep apnea: Obstructive sleep apnea. This is caused by a blocked or collapsed airway. Central sleep apnea. This happens when the brain does not send the right signals to the muscles that control breathing. Mixed sleep apnea. This is a combination of obstructive and central sleep apnea. What increases the risk? Being overweight. Smoking. Having a small airway. Being older. Being female. Drinking alcohol. Taking medicines to calm yourself (sedatives or tranquilizers). Having family members with the condition. Having a tongue or tonsils that are larger than normal. What are the signs or symptoms? Trouble staying asleep. Loud snoring. Headaches in the morning. Waking up gasping. Dry mouth or sore throat in the morning. Being sleepy or tired during the day. If you are sleepy or tired during the day, you may also: Not be able to focus your mind (concentrate). Forget things. Get angry a lot and have mood swings. Feel sad (depressed). Have changes in your personality. Have less interest in sex, if you are female. Be unable to have an erection, if you are female. How is this treated?  Sleeping on your side. Using a medicine to get rid of mucus in your nose (decongestant). Avoiding the use of alcohol, medicines to help you relax, or certain pain medicines (narcotics). Losing weight, if needed. Changing your diet. Quitting smoking. Using a machine to open your airway while you  sleep, such as: An oral appliance. This is a mouthpiece that shifts your lower jaw forward. A CPAP device. This device blows air through a mask when you breathe out (exhale). An EPAP device. This has valves that you put in each nostril. A BPAP device. This device blows air through a mask when you breathe in (inhale) and breathe out. Having surgery if other treatments do not work. Follow these instructions at home: Lifestyle Make changes that your doctor recommends. Eat a healthy diet. Lose weight if needed. Avoid alcohol, medicines to help you relax, and some pain medicines. Do not smoke or use any products that contain nicotine or tobacco. If you need help quitting, ask your doctor. General instructions Take over-the-counter and prescription medicines only as told by your doctor. If you were given a machine to use while you sleep, use it only as told by your doctor. If you are having surgery, make sure to tell your doctor you have sleep apnea. You may need to bring your device with you. Keep all follow-up visits. Contact a doctor if: The machine that you were given to use during sleep bothers you or does not seem to be working. You do not get better. You get worse. Get help right away if: Your chest hurts. You have trouble breathing in enough air. You have an uncomfortable feeling in your back, arms, or stomach. You have trouble talking. One side of your body feels weak. A part of your face is hanging down. These symptoms may be an emergency. Get help right away. Call your local emergency services (911 in the [place] Do not wait to see if the symptoms   will go away. Do not drive yourself to the hospital. Summary This condition affects breathing during sleep. The most common cause is a collapsed or blocked airway. The goal of treatment is to help you breathe normally while you sleep. This information is not intended to replace advice given to you by your health care provider. Make  sure you discuss any questions you have with your healthcare provider. Document Revised: 03/10/2020 Document Reviewed: 03/10/2020 Elsevier Patient Education  2022 [employer]  

## 2020-10-09 NOTE — Progress Notes (Signed)
[facility] [place], [place] [address]  Pulmonary Sleep Medicine   Office Visit Note  Patient Name: Courtney Alvarez DOB: 21-Jan-1976 MRN [id number]  Date of Service: 10/09/2020  Complaints/HPI: She has had a diagnosis of depression since about 2018. She has been on medications to include effexor. She had been complaining of excessive fatigue. She sleeps at 8-9pm. She wakes at 5am to go to work. 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 She wakes up feeling tired. She notes that there has been some improvement with her medication adjustment by Dr Nicolasa Ducking. Overall though she is not better. She has also been on anxiety medications in the form of temazepam. "I'm tired all the time". She also has been having headaches. She has been on medications for this for prevention. She states that she has a lot of stress also at work. She states that she does snore. Her husband has observed this too. She does not know about witnessed apneas. She has no other pulmonary symptoms. No cardiac issues. She is not menopausal. She has never actually fallen asleep while driving but on long drives she has caught her self nodding and has had to pull over  ROS  General: (-) fever, (-) chills, (-) night sweats, (-) weakness Skin: (-) rashes, (-) itching,. Eyes: (-) visual changes, (-) redness, (-) itching. Nose and Sinuses: (-) nasal stuffiness or itchiness, (-) postnasal drip, (-) nosebleeds, (-) sinus trouble. Mouth and Throat: (-) sore throat, (-) hoarseness. Neck: (-) swollen glands, (-) enlarged thyroid, (-) neck pain. Respiratory: - cough, (-) bloody sputum, - shortness of breath, - wheezing. Cardiovascular: - ankle swelling, (-) chest pain. Lymphatic: (-) lymph node enlargement. Neurologic: (-) numbness, (-) tingling. Psychiatric: (-) anxiety, (-) depression    EPWORTH SLEEPINESS SCALE:  Scale:  (0)= no chance of dozing; (1)= slight chance of dozing; (2)= moderate chance of dozing; (3)= high  chance of dozing  Chance  Situtation    Sitting and reading: 3    Watching TV: 2    Sitting Inactive in public: 1    As a passenger in car: 3      Lying down to rest: 3    Sitting and talking: 0    Sitting quielty after lunch: 1    In a car, stopped in traffic: 0   TOTAL SCORE:   13 out of 24   Current Medication: Outpatient Encounter Medications as of 10/09/2020  Medication Sig Note   calcium carbonate (TUMS - DOSED IN MG ELEMENTAL CALCIUM) 500 MG chewable tablet Chew 1 tablet by mouth daily. 11/11/2016: PRN    Cholecalciferol (PA VITAMIN D-3 GUMMY PO) Take 2 tablets by mouth daily.     Crisaborole 2 % OINT Apply TO AFFECTED AREA(S) OF RASH ON ARMS EVERY DAY AS NEEDED    cyanocobalamin 1000 MCG tablet Take 1,000 mcg by mouth daily.    FLUoxetine HCl 60 MG TABS Take 60 mg by mouth every morning.     Galcanezumab-gnlm (EMGALITY) 120 MG/ML SOAJ Inject 120 mg into the skin every 28 (twenty-eight) days.    lansoprazole (PREVACID) 15 MG capsule     levonorgestrel (MIRENA) 20 MCG/24HR IUD 1 each by Intrauterine route once.    Multiple Vitamins-Minerals (MULTIVITAMIN GUMMIES WOMENS) CHEW Chew 2 tablets by mouth daily.     Omega-3 1000 MG CAPS Take by mouth.    ondansetron (ZOFRAN-ODT) 4 MG disintegrating tablet Take 1 tablet (4 mg total) by mouth every 8 (eight) hours as needed  for nausea or vomiting.    temazepam (RESTORIL) 15 MG capsule Take 15 mg by mouth at bedtime as needed.    thyroid (NP THYROID) 120 MG tablet Take 1 tablet (120 mg total) by mouth daily before breakfast.    tiZANidine (ZANAFLEX) 4 MG tablet Take 1/2 tablet to 1 tablet every 6 hours as needed.  Caution for drowsiness.    venlafaxine XR (EFFEXOR-XR) 150 MG 24 hr capsule TK 1 C PO D    XIIDRA 5 % SOLN Place 1 drop into both eyes 2 (two) times daily.    No facility-administered encounter medications on file as of 10/09/2020.    Surgical History: Past Surgical History:  Procedure Laterality Date   BLADDER  SURGERY     CERVICAL BIOPSY  W/ LOOP ELECTRODE EXCISION     COLONOSCOPY WITH PROPOFOL N/A 01/05/2016   Procedure: COLONOSCOPY WITH PROPOFOL;  Surgeon: Lucilla Lame, MD;  Location: [place] [facility];  Service: Endoscopy;  Laterality: N/A;   FOOT SURGERY Left    POLYPECTOMY  01/05/2016   Procedure: POLYPECTOMY;  Surgeon: Lucilla Lame, MD;  Location: [place];  Service: Endoscopy;;    Medical History: Past Medical History:  Diagnosis Date   Arthritis    Chronic fatigue syndrome    GERD (gastroesophageal reflux disease)    Headache    tension/cluster   Hypothyroidism    Motion sickness    Plantar fasciitis of left foot    PONV (postoperative nausea and vomiting)    Thyroid disease    Vaginal Pap smear, abnormal     Family History: Family History  Problem Relation Age of Onset   Arthritis Mother    COPD Mother    Thyroid disease Mother    Arthritis Father    Hyperlipidemia Father    Cancer Father        polyp removed   Heart disease Father    Hashimoto's thyroiditis Sister    Arthritis Maternal Grandmother    Cancer Maternal Grandmother        colon   Heart disease Maternal Grandmother    Thyroid disease Maternal Grandmother    Alcohol abuse Maternal Grandfather    Arthritis Maternal Grandfather    Arthritis Paternal Grandmother    Depression Paternal Grandmother    Hypertension Paternal Grandmother    Stroke Paternal Grandmother    Arthritis Paternal Grandfather    Heart disease Paternal Grandfather    Hypertension Paternal Grandfather    Breast cancer Other     Social History: Social History   Socioeconomic History   Marital status: Married    Spouse name: Annie Main   Number of children: 1   Years of education: Not on file   Highest education level: Associate degree: occupational, Hotel manager, or vocational program  Occupational History   Occupation: RN  Tobacco Use   Smoking status: Former    Pack years: 0.00    Types: Cigarettes    Quit date:  06/14/2003    Years since quitting: 17.3   Smokeless tobacco: Never   Tobacco comments:    quit 06/2003  Vaping Use   Vaping Use: Never used  Substance and Sexual Activity   Alcohol use: Yes    Alcohol/week: 0.0 standard drinks    Comment: occasionally    Drug use: No   Sexual activity: Yes    Partners: Male    Birth control/protection: I.U.D.    Comment: mirena  Other Topics Concern   Not on file  Social History Narrative  Not on file   Social Determinants of Health   Financial Resource Strain: Not on file  Food Insecurity: Not on file  Transportation Needs: Not on file  Physical Activity: Not on file  Stress: Not on file  Social Connections: Not on file  Intimate Partner Violence: Not on file    Vital Signs: Blood pressure 104/72, pulse 75, temperature 98.4 F (36.9 C), resp. rate 16, height 5\' 6"  (1.676 m), weight 192 lb (87.1 kg), SpO2 98 %.  Examination: General Appearance: The patient is well-developed, well-nourished, and in no distress. Skin: Gross inspection of skin unremarkable. Head: normocephalic, no gross deformities. Eyes: no gross deformities noted. ENT: ears appear grossly normal no exudates. Neck: Supple. No thyromegaly. No LAD. Respiratory: no rhonchi noted. Cardiovascular: Normal S1 and S2 without murmur or rub. Extremities: No cyanosis. pulses are equal. Neurologic: Alert and oriented. No involuntary movements.  LABS: No results found for this or any previous visit (from the past 2160 hour(s)).  Radiology: MR Cervical Spine w/o contrast  Result Date: 07/29/2020 CLINICAL DATA:  Initial evaluation for neck pain with numbness in right arm and hand for 3-4 years. EXAM: MRI CERVICAL SPINE WITHOUT CONTRAST TECHNIQUE: Multiplanar, multisequence MR imaging of the cervical spine was performed. No intravenous contrast was administered. COMPARISON:  Previous MRI from 12/26/2016 FINDINGS: Alignment: Straightening of the normal cervical lordosis. Trace  retrolisthesis of C4 on C5, with trace anterolisthesis of C6 on C7, chronic and facet mediated. Vertebrae: Vertebral body height maintained without acute or chronic fracture. Bone marrow signal intensity within normal limits. Subcentimeter benign hemangioma noted within the T1 vertebral body. No other discrete or worrisome osseous lesions. No abnormal marrow edema. Cord: Normal signal and morphology. Posterior Fossa, vertebral arteries, paraspinal tissues: Visualized brain and posterior fossa within normal limits. Craniocervical junction normal. Paraspinous and prevertebral soft tissues within normal limits. Normal intravascular flow voids seen within the vertebral arteries bilaterally. Disc levels: C2-C3: Normal interspace. Left-sided facet hypertrophy. No significant spinal stenosis. Mild left C3 foraminal narrowing. Right neural foramen remains patent. C3-C4: Mild annular disc bulge with uncovertebral hypertrophy. Mild left-sided facet hypertrophy. No spinal stenosis. Mild to moderate left C4 foraminal stenosis. Right neural foramina remains patent. C4-C5: Trace retrolisthesis. Broad-based posterior disc bulge flattens and partially effaces the ventral thecal sac, eccentric to the right. No significant spinal stenosis or cord deformity. Moderate right worse than left C5 foraminal stenosis. C5-C6: Broad-based posterior disc bulge flattens and partially effaces the ventral thecal sac without significant spinal stenosis or cord impingement. Moderate bilateral C6 foraminal narrowing, slightly worse on the right. C6-C7: Trace anterolisthesis. Mild disc bulge with uncovertebral hypertrophy. Flattening of the ventral thecal sac without significant spinal stenosis. Mild bilateral C7 foraminal stenosis. C7-T1:  Normal interspace.  Mild facet hypertrophy.  No stenosis. Visualized upper thoracic spine demonstrates no significant finding. IMPRESSION: 1. Mild multilevel cervical spondylosis without significant spinal  stenosis. 2. Multifactorial degenerative changes with resultant multilevel foraminal narrowing as above. Notable findings include mild left C3 foraminal stenosis, mild to moderate left C4 foraminal narrowing, moderate right worse than left C5 and C6 foraminal stenosis, with mild bilateral C7 foraminal narrowing. Electronically Signed   By: Jeannine Boga M.D.   On: 07/29/2020 20:19    No results found.  Epidural Steroid injection  Result Date: 09/13/2020 Magnus Sinning, MD     09/13/2020  1:41 PM Cervical Epidural Steroid Injection - Interlaminar Approach with Fluoroscopic Guidance Patient: BURMA KETCHER     Date of Birth: [birth year] MRN: [id number]  PCP: Towanda Malkin, MD     Visit Date: 09/13/2020  Universal Protocol:   Date/Time: 09/14/2210:59 PM Consent Given By: the patient Position: PRONE Additional Comments: Vital signs were monitored before and after the procedure. Patient was prepped and draped in the usual sterile fashion. The correct patient, procedure, and site was verified. Injection Procedure Details: Procedure diagnoses: Cervical radiculopathy [M54.12]  Meds Administered: Meds ordered this encounter Medications  betamethasone acetate-betamethasone sodium phosphate (CELESTONE) injection 12 mg  Laterality: Right Location/Site: C7-T1 Needle: 3.5 in., 20 ga. Tuohy Needle Placement: Paramedian epidural space Findings:  -Comments: Excellent flow of contrast into the epidural space. Procedure Details: Using a paramedian approach from the side mentioned above, the region overlying the inferior lamina was localized under fluoroscopic visualization and the soft tissues overlying this structure were infiltrated with 4 ml. of 1% Lidocaine without Epinephrine. A # 20 gauge, Tuohy needle was inserted into the epidural space using a paramedian approach. The epidural space was localized using loss of resistance along with contralateral oblique bi-planar fluoroscopic views.  After negative aspirate  for air, blood, and CSF, a 2 ml. volume of Isovue-250 was injected into the epidural space and the flow of contrast was observed. Radiographs were obtained for documentation purposes. The injectate was administered into the level noted above. Additional Comments: The patient tolerated the procedure well Dressing: 2 x 2 sterile gauze and Band-Aid  Post-procedure details: Patient was observed during the procedure. Post-procedure instructions were reviewed. Patient left the clinic in stable conditio  XR C-ARM NO REPORT  Result Date: 09/13/2020 Please see Notes tab for imaging impression.     Assessment and Plan: Patient Active Problem List   Diagnosis Date Noted   IUD (intrauterine device) in place 05/11/2020   Anxiety with depression 09/09/2019   Gastroesophageal reflux disease without esophagitis 08/26/2018   Moderate episode of recurrent major depressive disorder ([place]) 08/26/2018   GAD (generalized anxiety disorder) 08/26/2018   Dry eyes 08/26/2018   Immune to varicella 06/01/2017   Family history of colonic polyps    Benign neoplasm of sigmoid colon    Family history of colon cancer 07/31/2015   Class 1 obesity due to excess calories without serious comorbidity with body mass index (BMI) of 33.0 to 33.9 in adult 07/31/2015   Loss of feeling or sensation 06/22/2015   Chronic pain in left foot 06/22/2015   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[place]) 12/16/2013   Arthralgia of multiple joints 12/16/2013   Goiter, nontoxic, 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   Adult hypothyroidism 09/07/2013    1. Other hypersomnia Interesting case multiple reasons for fatigue. She is on multple medications could be contributing. She will need to get a PSG to assess prior to the sleep study it would be beneficial to stopping her psychotropic medications.  2. Depression, recurrent ([place]) Needs to continue to follow with the primary psychiatrist.  Might need medication adjustment based on report  over the sleep study shows.  I would like to do her sleep study off of current psychotropic medications  3. Anxiety with depression Ongoing psychotherapy would be of benefit  4. Difficulty sleeping As above may be multifactorial with depression anxiety playing a role in her overall sleep difficulties that she has noted to me  General Counseling: I have discussed the findings of the evaluation and examination with Shelena.  I have also discussed any further diagnostic evaluation thatmay be needed or ordered today. [place] verbalizes understanding of the findings of todays visit. We also reviewed her  medications today and discussed drug interactions and side effects including but not limited excessive drowsiness and altered mental states. We also discussed that there is always a risk not just to her but also people around her. she has been encouraged to call the office with any questions or concerns that should arise related to todays visit.  No orders of the defined types were placed in this encounter.    Time spent: 64  I have personally obtained a history, examined the patient, evaluated laboratory and imaging results, formulated the assessment and plan and placed orders.    Allyne Gee, MD [facility] Pulmonary and Critical Care Sleep medicine

## 2020-10-12 ENCOUNTER — Encounter: Payer: Self-pay | Admitting: Orthopedic Surgery

## 2020-10-18 ENCOUNTER — Telehealth: Payer: Self-pay | Admitting: Orthopedic Surgery

## 2020-10-18 NOTE — Telephone Encounter (Signed)
done

## 2020-10-18 NOTE — Telephone Encounter (Signed)
Done thx

## 2020-10-18 NOTE — Telephone Encounter (Signed)
No sorry. I checked with [employer] also, doesn't have.

## 2020-10-18 NOTE — Telephone Encounter (Signed)
I have not seen it.

## 2020-10-18 NOTE — Telephone Encounter (Signed)
Patient called. She says she needs the form filled out that she dropped off for the military sent to Mrs. Sherry Chopp. Please call patient when done.

## 2020-10-18 NOTE — Telephone Encounter (Signed)
I have printed FCE for Dr Marlou Sa to review

## 2020-10-19 NOTE — Telephone Encounter (Signed)
Tried calling to advise this was completed and would be sent for her today.  No answer but did also send her a [employer].

## 2020-10-26 ENCOUNTER — Encounter: Payer: Self-pay | Admitting: Orthopedic Surgery

## 2020-10-27 ENCOUNTER — Other Ambulatory Visit: Payer: Self-pay

## 2020-10-27 ENCOUNTER — Ambulatory Visit (INDEPENDENT_AMBULATORY_CARE_PROVIDER_SITE_OTHER): Payer: Federal, State, Local not specified - PPO | Admitting: Physical Medicine and Rehabilitation

## 2020-10-27 ENCOUNTER — Encounter: Payer: Self-pay | Admitting: Physical Medicine and Rehabilitation

## 2020-10-27 DIAGNOSIS — R202 Paresthesia of skin: Secondary | ICD-10-CM | POA: Diagnosis not present

## 2020-10-27 NOTE — Progress Notes (Signed)
Numbness in fingertips of right hand. Sometimes radiates up to shoulder/ scapula. Right hand dominant +lotion Numeric Pain Rating Scale and Functional Assessment Average Pain 6   In the last MONTH (on 0-10 scale) has pain interfered with the following?  1. General activity like being  able to carry out your everyday physical activities such as walking, climbing stairs, carrying groceries, or moving a chair?  Rating(6)

## 2020-10-29 NOTE — Telephone Encounter (Signed)
For 6 months and she will need to get another provider to renew if needed thx

## 2020-10-30 NOTE — Progress Notes (Signed)
Courtney Alvarez - 45 y.o. female MRN [id number]  Date of birth: 1975/09/09  Office Visit Note: Visit Date: 10/27/2020 PCP: Towanda Malkin, MD Referred by: Meredith Pel, MD  Subjective: Chief Complaint  Patient presents with   Right Hand - Numbness   HPI:  Courtney Alvarez is a 45 y.o. female who comes in today at the request of Dr. Anderson Malta for electrodiagnostic study of the Right upper extremities.  Patient is Right hand dominant.  She reports 6 out of 10 chronic severe numbness of the fingertips in all digits on the right hand.  Sometimes more radial digits.  She gets some referral up into the shoulder and scapula.  She has pain of multiple joints and history of chronic fatigue syndrome.  Prior cervical epidural injection performed in our office in June of this year did not help very much.  She has no recent electrodiagnostic study.  No left-sided complaints.   ROS Otherwise per HPI.  Assessment & Plan: Visit Diagnoses:    ICD-10-CM   1. 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 There is no significant electrodiagnostic evidence of any other focal nerve entrapment, brachial plexopathy or cervical radiculopathy.   Recommendations: 1.  Follow-up with referring physician. 2.  Continue current management of symptoms. 3.  Although mild electrically this could represent a source of some of her symptoms.  Consider diagnostic injection or treatment with night splint.  Meds & Orders: No orders of the defined types were placed in this encounter.   Orders Placed This Encounter  Procedures   NCV with EMG (electromyography)    Follow-up: Return for  G. Alphonzo Severance, MD.   Procedures: No procedures performed  EMG & NCV Findings: Evaluation of the right median  (across palm) sensory nerve showed prolonged distal peak latency (Wrist, 4.3 ms) and prolonged distal peak latency (Palm, 2.4 ms).  All remaining nerves (as indicated in the following tables) were within normal limits.    All examined muscles (as indicated in the following table) showed no evidence of electrical instability.    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 There is no significant electrodiagnostic evidence of any other focal nerve entrapment, brachial plexopathy or cervical radiculopathy.   Recommendations: 1.  Follow-up with referring physician. 2.  Continue current management of symptoms. 3.  Although mild electrically this could represent a source of some of her symptoms.  Consider diagnostic injection or treatment with night splint.  ___________________________ Laurence Spates [facility] Board Certified, American Board of Physical Medicine and Rehabilitation    Nerve Conduction Studies Anti Sensory Summary Table   Stim Site NR Peak (ms) Norm Peak (ms) P-T Amp (V) Norm P-T Amp Site1 Site2 Delta-P (ms) Dist (cm) Vel (m/s) Norm Vel (m/s)  Right Median Acr Palm Anti Sensory (2nd Digit)  30.4C  Wrist    *4.3 <3.6 32.1 >10 Wrist Palm 1.9 0.0    Palm    *2.4 <2.0 25.0         Right Radial Anti Sensory (Base 1st Digit)  31.3C  Wrist    2.1 <3.1 38.0  Wrist Base 1st Digit 2.1 0.0    Right Ulnar Anti Sensory (  5th Digit)  30.9C  Wrist    3.3 <3.7 18.8 >15.0 Wrist 5th Digit 3.3 14.0 42 >38   Motor Summary Table   Stim Site NR Onset (ms) Norm Onset (ms) O-P Amp (mV) Norm O-P Amp Site1 Site2 Delta-0 (ms) Dist (cm) Vel (m/s) Norm Vel (m/s)  Right Median Motor (Abd Poll Brev)  31.9C  Wrist    3.6 <4.2 7.7 >5 Elbow Wrist 4.0 22.5 56 >50  Elbow    7.6  7.4         Right Ulnar Motor (Abd Dig Min)  32.1C  Wrist    3.0 <4.2 11.8 >3 B Elbow  Wrist 3.3 21.0 64 >53  B Elbow    6.3  12.0  A Elbow B Elbow 1.3 10.0 77 >53  A Elbow    7.6  11.3          EMG   Side Muscle Nerve Root Ins Act Fibs Psw Amp Dur Poly Recrt Int Fraser Din Comment  Right 1stDorInt Ulnar C8-T1 Nml Nml Nml Nml Nml 0 Nml Nml   Right Abd Poll Brev Median C8-T1 Nml Nml Nml Nml Nml 0 Nml Nml   Right ExtDigCom   Nml Nml Nml Nml Nml 0 Nml Nml   Right Triceps Radial C6-7-8 Nml Nml Nml Nml Nml 0 Nml Nml   Right Deltoid Axillary C5-6 Nml Nml Nml Nml Nml 0 Nml Nml     Nerve Conduction Studies Anti Sensory Left/Right Comparison   Stim Site L Lat (ms) R Lat (ms) L-R Lat (ms) L Amp (V) R Amp (V) L-R Amp (%) Site1 Site2 L Vel (m/s) R Vel (m/s) L-R Vel (m/s)  Median Acr Palm Anti Sensory (2nd Digit)  30.4C  Wrist  *4.3   32.1  Wrist Palm     Palm  *2.4   25.0        Radial Anti Sensory (Base 1st Digit)  31.3C  Wrist  2.1   38.0  Wrist Base 1st Digit     Ulnar Anti Sensory (5th Digit)  30.9C  Wrist  3.3   18.8  Wrist 5th Digit  42    Motor Left/Right Comparison   Stim Site L Lat (ms) R Lat (ms) L-R Lat (ms) L Amp (mV) R Amp (mV) L-R Amp (%) Site1 Site2 L Vel (m/s) R Vel (m/s) L-R Vel (m/s)  Median Motor (Abd Poll Brev)  31.9C  Wrist  3.6   7.7  Elbow Wrist  56   Elbow  7.6   7.4        Ulnar Motor (Abd Dig Min)  32.1C  Wrist  3.0   11.8  B Elbow Wrist  64   B Elbow  6.3   12.0  A Elbow B Elbow  77   A Elbow  7.6   11.3           Waveforms:            Clinical History: MRI CERVICAL SPINE WITHOUT CONTRAST   TECHNIQUE: Multiplanar, multisequence MR imaging of the cervical spine was performed. No intravenous contrast was administered.   COMPARISON:  Previous MRI from 12/26/2016   FINDINGS: Alignment: Straightening of the normal cervical lordosis. Trace retrolisthesis of C4 on C5, with trace anterolisthesis of C6 on C7, chronic and facet mediated.   Vertebrae: Vertebral body height maintained without acute or chronic fracture. Bone marrow signal  intensity within normal limits. Subcentimeter benign hemangioma noted within the T1 vertebral body. No other discrete  or worrisome osseous lesions. No abnormal marrow edema.   Cord: Normal signal and morphology.   Posterior Fossa, vertebral arteries, paraspinal tissues: Visualized brain and posterior fossa within normal limits. Craniocervical junction normal. Paraspinous and prevertebral soft tissues within normal limits. Normal intravascular flow voids seen within the vertebral arteries bilaterally.   Disc levels:   C2-C3: Normal interspace. Left-sided facet hypertrophy. No significant spinal stenosis. Mild left C3 foraminal narrowing. Right neural foramen remains patent.   C3-C4: Mild annular disc bulge with uncovertebral hypertrophy. Mild left-sided facet hypertrophy. No spinal stenosis. Mild to moderate left C4 foraminal stenosis. Right neural foramina remains patent.   C4-C5: Trace retrolisthesis. Broad-based posterior disc bulge flattens and partially effaces the ventral thecal sac, eccentric to the right. No significant spinal stenosis or cord deformity. Moderate right worse than left C5 foraminal stenosis.   C5-C6: Broad-based posterior disc bulge flattens and partially effaces the ventral thecal sac without significant spinal stenosis or cord impingement. Moderate bilateral C6 foraminal narrowing, slightly worse on the right.   C6-C7: Trace anterolisthesis. Mild disc bulge with uncovertebral hypertrophy. Flattening of the ventral thecal sac without significant spinal stenosis. Mild bilateral C7 foraminal stenosis.   C7-T1:  Normal interspace.  Mild facet hypertrophy.  No stenosis.   Visualized upper thoracic spine demonstrates no significant finding.   IMPRESSION: 1. Mild multilevel cervical spondylosis without significant spinal stenosis. 2. Multifactorial degenerative changes with resultant multilevel foraminal narrowing as above. Notable findings include mild  left C3 foraminal stenosis, mild to moderate left C4 foraminal narrowing, moderate right worse than left C5 and C6 foraminal stenosis, with mild bilateral C7 foraminal narrowing.     Electronically Signed   By: Jeannine Boga M.D.   On: 07/29/2020 20:19     Objective:  VS:  HT:    WT:   BMI:     BP:   HR: bpm  TEMP: ( )  RESP:  Physical Exam Musculoskeletal:        General: No swelling, tenderness or deformity.     Comments: Inspection reveals no atrophy of the bilateral APB or FDI or hand intrinsics. There is no swelling, color changes, allodynia or dystrophic changes. There is 5 out of 5 strength in the bilateral wrist extension, finger abduction and long finger flexion. There is intact sensation to light touch in all dermatomal and peripheral nerve distributions.  There is a negative Hoffmann's test bilaterally.  Skin:    General: Skin is warm and dry.     Findings: No erythema or rash.  Neurological:     General: No focal deficit present.     Mental Status: She is alert and oriented to person, place, and time.     Motor: No weakness or abnormal muscle tone.     Coordination: Coordination normal.  Psychiatric:        Mood and Affect: Mood normal.        Behavior: Behavior normal.     Imaging: No results found.

## 2020-10-31 NOTE — Procedures (Signed)
EMG & NCV Findings: Evaluation of the right median (across palm) sensory nerve showed prolonged distal peak latency (Wrist, 4.3 ms) and prolonged distal peak latency (Palm, 2.4 ms).  All remaining nerves (as indicated in the following tables) were within normal limits.    All examined muscles (as indicated in the following table) showed no evidence of electrical instability.    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 There is no significant electrodiagnostic evidence of any other focal nerve entrapment, brachial plexopathy or cervical radiculopathy.   Recommendations: 1.  Follow-up with referring physician. 2.  Continue current management of symptoms. 3.  Although mild electrically this could represent a source of some of her symptoms.  Consider diagnostic injection or treatment with night splint.  ___________________________ Laurence Spates [facility] Board Certified, American Board of Physical Medicine and Rehabilitation    Nerve Conduction Studies Anti Sensory Summary Table   Stim Site NR Peak (ms) Norm Peak (ms) P-T Amp (V) Norm P-T Amp Site1 Site2 Delta-P (ms) Dist (cm) Vel (m/s) Norm Vel (m/s)  Right Median Acr Palm Anti Sensory (2nd Digit)  30.4C  Wrist    *4.3 <3.6 32.1 >10 Wrist Palm 1.9 0.0    Palm    *2.4 <2.0 25.0         Right Radial Anti Sensory (Base 1st Digit)  31.3C  Wrist    2.1 <3.1 38.0  Wrist Base 1st Digit 2.1 0.0    Right Ulnar Anti Sensory (5th Digit)  30.9C  Wrist    3.3 <3.7 18.8 >15.0 Wrist 5th Digit 3.3 14.0 42 >38   Motor Summary Table   Stim Site NR Onset (ms) Norm Onset (ms) O-P Amp (mV) Norm O-P Amp Site1 Site2 Delta-0 (ms) Dist (cm) Vel (m/s) Norm Vel (m/s)  Right Median Motor (Abd Poll Brev)  31.9C  Wrist    3.6 <4.2 7.7 >5 Elbow Wrist 4.0 22.5 56 >50  Elbow    7.6  7.4         Right Ulnar Motor (Abd Dig  Min)  32.1C  Wrist    3.0 <4.2 11.8 >3 B Elbow Wrist 3.3 21.0 64 >53  B Elbow    6.3  12.0  A Elbow B Elbow 1.3 10.0 77 >53  A Elbow    7.6  11.3          EMG   Side Muscle Nerve Root Ins Act Fibs Psw Amp Dur Poly Recrt Int Fraser Din Comment  Right 1stDorInt Ulnar C8-T1 Nml Nml Nml Nml Nml 0 Nml Nml   Right Abd Poll Brev Median C8-T1 Nml Nml Nml Nml Nml 0 Nml Nml   Right ExtDigCom   Nml Nml Nml Nml Nml 0 Nml Nml   Right Triceps Radial C6-7-8 Nml Nml Nml Nml Nml 0 Nml Nml   Right Deltoid Axillary C5-6 Nml Nml Nml Nml Nml 0 Nml Nml     Nerve Conduction Studies Anti Sensory Left/Right Comparison   Stim Site L Lat (ms) R Lat (ms) L-R Lat (ms) L Amp (V) R Amp (V) L-R Amp (%) Site1 Site2 L Vel (m/s) R Vel (m/s) L-R Vel (m/s)  Median Acr Palm Anti Sensory (2nd Digit)  30.4C  Wrist  *4.3   32.1  Wrist Palm     Palm  *2.4   25.0        Radial Anti Sensory (Base 1st Digit)  31.3C  Wrist  2.1   38.0  Wrist Base 1st Digit     Ulnar Anti Sensory (5th Digit)  30.9C  Wrist  3.3   18.8  Wrist 5th Digit  42    Motor Left/Right Comparison   Stim Site L Lat (ms) R Lat (ms) L-R Lat (ms) L Amp (mV) R Amp (mV) L-R Amp (%) Site1 Site2 L Vel (m/s) R Vel (m/s) L-R Vel (m/s)  Median Motor (Abd Poll Brev)  31.9C  Wrist  3.6   7.7  Elbow Wrist  56   Elbow  7.6   7.4        Ulnar Motor (Abd Dig Min)  32.1C  Wrist  3.0   11.8  B Elbow Wrist  64   B Elbow  6.3   12.0  A Elbow B Elbow  77   A Elbow  7.6   11.3           Waveforms:

## 2020-11-01 ENCOUNTER — Telehealth: Payer: Self-pay | Admitting: Orthopedic Surgery

## 2020-11-01 NOTE — Telephone Encounter (Signed)
Updated [facility] form faxed with last 2 ov notes to [facility] [phone].

## 2020-11-10 DIAGNOSIS — F411 Generalized anxiety disorder: Secondary | ICD-10-CM | POA: Diagnosis not present

## 2020-11-10 DIAGNOSIS — F5105 Insomnia due to other mental disorder: Secondary | ICD-10-CM | POA: Diagnosis not present

## 2020-11-10 DIAGNOSIS — F321 Major depressive disorder, single episode, moderate: Secondary | ICD-10-CM | POA: Diagnosis not present

## 2020-11-15 ENCOUNTER — Telehealth: Payer: Self-pay

## 2020-11-15 NOTE — Telephone Encounter (Signed)
PT HAS BEEN SCHEDULED AT [facility] TO HAVE PSG STUDY DONE ON AUG. 19TH, 2022

## 2020-11-17 ENCOUNTER — Ambulatory Visit: Payer: Federal, State, Local not specified - PPO | Admitting: Orthopedic Surgery

## 2020-11-23 ENCOUNTER — Ambulatory Visit: Payer: Federal, State, Local not specified - PPO | Admitting: Internal Medicine

## 2020-11-23 ENCOUNTER — Telehealth: Payer: Self-pay | Admitting: Family Medicine

## 2020-11-23 DIAGNOSIS — E039 Hypothyroidism, unspecified: Secondary | ICD-10-CM

## 2020-11-23 NOTE — Telephone Encounter (Signed)
Pt needs in person visit for thyroid refill

## 2020-11-23 NOTE — Telephone Encounter (Signed)
LVM to schedule appt for medication refill per nurse

## 2020-11-27 ENCOUNTER — Other Ambulatory Visit: Payer: Self-pay

## 2020-11-27 ENCOUNTER — Ambulatory Visit: Payer: Federal, State, Local not specified - PPO | Admitting: Orthopedic Surgery

## 2020-11-27 DIAGNOSIS — M542 Cervicalgia: Secondary | ICD-10-CM | POA: Diagnosis not present

## 2020-11-28 ENCOUNTER — Telehealth: Payer: Self-pay | Admitting: Internal Medicine

## 2020-11-28 DIAGNOSIS — E039 Hypothyroidism, unspecified: Secondary | ICD-10-CM

## 2020-11-28 MED ORDER — THYROID 120 MG PO TABS: 120.0000 mg | ORAL_TABLET | Freq: Every day | ORAL | 0 refills | Status: DC

## 2020-11-28 NOTE — Telephone Encounter (Signed)
Pt called and scheduled a follow up for 8.29.22/ Pt is asking if she can get a refill for enough to get her to her 8.29.22 appt / refill is needed for thyroid (NP THYROID) 120 MG tablet   Please advise    [facility] WX:[id number] Lorina Rabon, [place] [facility] [place]  [address] [place], [place] [address]  Phone:  [phone]  Fax:  [phone]

## 2020-11-30 ENCOUNTER — Encounter: Payer: Self-pay | Admitting: Internal Medicine

## 2020-12-05 ENCOUNTER — Encounter: Payer: Self-pay | Admitting: Orthopedic Surgery

## 2020-12-05 ENCOUNTER — Ambulatory Visit: Payer: Federal, State, Local not specified - PPO | Admitting: Internal Medicine

## 2020-12-05 NOTE — Progress Notes (Signed)
Office Visit Note   Patient: Courtney Alvarez           Date of Birth: Mar 06, 1976           MRN: [id number] Visit Date: 11/27/2020 Requested by: Towanda Malkin, MD [address] [place],  [place] [address] PCP: Towanda Malkin, MD  Subjective: Chief Complaint  Patient presents with   Other     EMG/NCV review    HPI: Courtney Alvarez is a 45 y.o. female who presents to the office complaining of continued neck and head pain as well as right hand numbness and tingling.  She notices numbness and tingling in her index and middle fingers primarily.  She denies any forearm symptoms.  She had nerve conduction study by Dr. Ernestina Patches that revealed mild right carpal tunnel syndrome.  She had ESI by Dr. Ernestina Patches since her last visit that provided no relief even for a day of her neck pain.  She complains of continued numbness and tingling in her right scapula as well.  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  She is taking Tylenol which helps with her pain.  She also has seen neurology since her last visit and feels that some of the medications they prescribed were helpful but some are not so much and she has given up on taking those.  Most of her neck pain she localizes to the superior aspect of the neck near the occiput with radiation around to her head.              ROS: All systems reviewed are negative as they relate to the chief complaint within the history of present illness.  Patient denies fevers or chills.  Assessment & Plan: Visit Diagnoses: No diagnosis found.  Plan: Patient is a 45 year old female who presents for repeat evaluation of neck pain with scapular numbness/tingling and right hand numbness/tingling.  She has had ESI since her last appointment by Dr. Ernestina Patches at [facility] that provided no relief even for a day of her symptoms.  Nerve conduction study shows mild right carpal tunnel syndrome but no significant muscle  wasting or deficits noted on exam.  Not reliably able to reproduce symptoms in her hand.  Discussed options available to patient and with underwhelming nerve conduction study and no significantly reproducible symptoms by physical exam, plan to hold off on carpal tunnel release for now.  Also with no relief from her last ESI, discussed trying another ESI at a different location with Dr. Ernestina Patches.  She is agreeable to try this.  Plan to refer her to Dr. Ernestina Patches to try different type of ESI to see if this will relieve her neck pain or scapular numbness/tingling.  Follow-up with the office as needed  Follow-Up Instructions: No follow-ups on file.   Orders:  No orders of the defined types were placed in this encounter.  No orders of the defined types were placed in this encounter.     Procedures: No procedures performed   Clinical Data: No additional findings.  Objective: Vital Signs: There were no vitals taken for this visit.  Physical Exam:  Constitutional: Patient appears well-developed HEENT:  Head: Normocephalic Eyes:EOM are normal Neck: Normal range of motion Cardiovascular: Normal rate Pulmonary/chest: Effort normal Neurologic: Patient is alert Skin: Skin is warm Psychiatric: Patient has normal mood and affect  Ortho Exam: Ortho exam demonstrates negative Tinel sign.  Negative Phalen  sign.  Very mildly positive Durkan sign.  Negative Allen test of the right hand.  2+ radial pulse of the right upper extremity.  5/5 motor strength of bilateral grip strength, finger abduction, pronation/supination, bicep, tricep, deltoid.  Negative Froment's sign.  Negative elbow flexion test.  Specialty Comments:  No specialty comments available.  Imaging: No results found.   PMFS History: Patient Active Problem List   Diagnosis Date Noted   IUD (intrauterine device) in place 05/11/2020   Anxiety with depression 09/09/2019   Gastroesophageal reflux disease without esophagitis 08/26/2018    Moderate episode of recurrent major depressive disorder ([place]) 08/26/2018   GAD (generalized anxiety disorder) 08/26/2018   Dry eyes 08/26/2018   Immune to varicella 06/01/2017   Family history of colonic polyps    Benign neoplasm of sigmoid colon    Family history of colon cancer 07/31/2015   Class 1 obesity due to excess calories without serious comorbidity with body mass index (BMI) of 33.0 to 33.9 in adult 07/31/2015   Loss of feeling or sensation 06/22/2015   Chronic pain in left foot 06/22/2015   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[place]) 12/16/2013   Arthralgia of multiple joints 12/16/2013   Goiter, nontoxic, 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   Adult hypothyroidism 09/07/2013   Past Medical History:  Diagnosis Date   Arthritis    Chronic fatigue syndrome    GERD (gastroesophageal reflux disease)    Headache    tension/cluster   Hypothyroidism    Motion sickness    Plantar fasciitis of left foot    PONV (postoperative nausea and vomiting)    Thyroid disease    Vaginal Pap smear, abnormal     Family History  Problem Relation Age of Onset   Arthritis Mother    COPD Mother    Thyroid disease Mother    Arthritis Father    Hyperlipidemia Father    Cancer Father        polyp removed   Heart disease Father    Hashimoto's thyroiditis Sister    Arthritis Maternal Grandmother    Cancer Maternal Grandmother        colon   Heart disease Maternal Grandmother    Thyroid disease Maternal Grandmother    Alcohol abuse Maternal Grandfather    Arthritis Maternal Grandfather    Arthritis Paternal Grandmother    Depression Paternal Grandmother    Hypertension Paternal Grandmother    Stroke Paternal Grandmother    Arthritis Paternal Grandfather    Heart disease Paternal Grandfather    Hypertension Paternal Grandfather    Breast cancer Other     Past Surgical History:  Procedure Laterality Date   BLADDER SURGERY     CERVICAL BIOPSY  W/ LOOP ELECTRODE EXCISION      COLONOSCOPY WITH PROPOFOL N/A 01/05/2016   Procedure: COLONOSCOPY WITH PROPOFOL;  Surgeon: Lucilla Lame, MD;  Location: [facility];  Service: Endoscopy;  Laterality: N/A;   FOOT SURGERY Left    POLYPECTOMY  01/05/2016   Procedure: POLYPECTOMY;  Surgeon: Lucilla Lame, MD;  Location: [facility];  Service: Endoscopy;;   Social History   Occupational History   Occupation: RN  Tobacco Use   Smoking status: Former    Types: Cigarettes    Quit date: 06/14/2003    Years since quitting: 17.4   Smokeless tobacco: Never   Tobacco comments:    quit 06/2003  Vaping Use   Vaping Use: Never used  Substance and Sexual Activity   Alcohol use: Yes  Alcohol/week: 0.0 standard drinks    Comment: occasionally    Drug use: No   Sexual activity: Yes    Partners: Male    Birth control/protection: I.U.D.    Comment: mirena

## 2020-12-08 ENCOUNTER — Encounter (INDEPENDENT_AMBULATORY_CARE_PROVIDER_SITE_OTHER): Payer: Federal, State, Local not specified - PPO | Admitting: Internal Medicine

## 2020-12-08 DIAGNOSIS — G4719 Other hypersomnia: Secondary | ICD-10-CM | POA: Diagnosis not present

## 2020-12-11 ENCOUNTER — Other Ambulatory Visit: Payer: Self-pay

## 2020-12-11 ENCOUNTER — Ambulatory Visit: Payer: Federal, State, Local not specified - PPO | Admitting: Family Medicine

## 2020-12-11 DIAGNOSIS — M542 Cervicalgia: Secondary | ICD-10-CM

## 2020-12-12 DIAGNOSIS — G4719 Other hypersomnia: Secondary | ICD-10-CM | POA: Insufficient documentation

## 2020-12-12 NOTE — Procedures (Signed)
[place] Report Part I                                                                 Phone: [phone] Fax: [phone]  Patient Name: Courtney, Alvarez Acquisition Number: [id number]  Date of Birth: 1975-08-19 Acquisition Date: 12/08/2020  Referring Physician: Devona Konig, MD     History: The patient is a 45 year old female who was referred for evaluation of possible sleep apnea. Medical History: Depression, hypersomnolence.  Medications: calcium carbonate, cholecalciferol, crisaborole, cyanocobalamin, fluoxetine hcl, galcanezumab-gnim, lansoprazol, levonorgestrel, multiple vitamins, omega-3, ondansetron, temazepam, thyroid tablet, tizanidine, venlafaxine xr, xiidra.  Procedure: This routine overnight polysomnogram was performed on the [facility] 5 using the standard diagnostic protocol. This included 6 channels of EEG, 2 channels of EOG, chin EMG, bilateral anterior tibialis EMG, nasal/oral thermistor, PTAF (nasal pressure transducer), chest and abdominal wall movements, EKG, and pulse oximetry.  Description: The total recording time was 407.3 minutes. The total sleep time was 373.1 minutes. There were a total of 18.5 minutes of wakefulness after sleep onset for a slightly reducedsleep efficiency of 91.6%. The latency to sleep onset was within normal limitsat 15.7 minutes. The R sleep onset latency was prolonged at 188.5 minutes. Sleep parameters, as a percentage of the total sleep time, demonstrated 8.4% of sleep was in N1 sleep, 65.0% N2, 16.2% N3 and 10.3% R sleep. There were a total of 118 arousals for an arousal index of 19.0 arousals per hour of sleep that was elevated.  Respiratory monitoring demonstrated rare mild degree of snoring in all positions. There were no obstructive apneas, hypopneas or respiratory effort related arousals (RERAs) observed during the recording.  The baseline oxygen saturation during wakefulness was 95%, during NREM sleep averaged 95%,  and during REM sleep averaged 95%.  The total duration of oxygen < 90% was 16.3 minutes.  Cardiac monitoring There were no significant cardiac rhythm irregularities.   Periodic limb movement monitoring demonstrated that there were 42 periodic limb movements for a periodic limb movement index of 6.8 periodic limb movements per hour of sleep.   Impression: This routine overnight polysomnogram did not demonstrate obstructive sleep apnea. There were no obstructive apneas, hypopneas or significant RERAs during the recording.  There were few periodic limb movements that commonly are not significant. Clinical correlation would be suggested.   Only a reduced REM percentage was observed. The patient reports a history of hypersomnia with an elevated Epworth Sleepiness Score of 16 out of 24 despite 7.5-8.5 hours of sleep per night.  This suggests the possible presence of another sleep disorder of hypersomnia and a repeat polysomnogram followed by a multiple sleep latency test is suggested. The patient should ensure a minimum of 7-8 hours sleep per night in the 2 weeks prior to the study and a 2 week sleep diary should be maintained.   Recommendations:     Would recommend weight loss in a patient with a BMI of 32.3.     Allyne Gee, MD, [facility] Diplomate ABMS-Pulmonary, Critical Care and Sleep Medicine  Electronically reviewed and digitally signed     [place] Report Part II  Phone: [phone] Fax: [phone]  Patient last name Alvarez Neck Size    in.  Acquisition [id number]  Patient first name Courtney Weight    lbs. Started 12/08/2020 at 10:22:28 PM  Birth date [birth year] Height    in. Stopped 12/09/2020 at 5:30:34 AM  Age 37 BMI    lb/in2 Duration 407.3  Study Type Adult      Cindy Y. Sleep Data: Lights Out: 10:29:16 PM Sleep Onset: 10:44:28 PM  Lights On: 5:16:34 AM Sleep Efficiency: 91.5 %  Total Recording Time: 407.3 min Sleep Latency (from Lights Off) 15.2  min  Total Sleep Time (TST): 372.6 min R Latency (from Sleep Onset): 189.0 min  Sleep Period Time: 392.1 min Total number of awakenings: 11  Wake during sleep: 19.5 min Wake After Sleep Onset (WASO): 19.5 min   Sleep Data:         Arousal Summary: Stage  Latency from lights out (min) Latency from sleep onset (min) Duration (min) % Total Sleep Time  Normal values  N 1 15.2 0.0 31.5 8.5 (5%)  N 2 17.2 2.0 246.1 66.0 (50%)  N 3 29.2 14.0 60.5 16.2 (20%)  R 204.2 189.0 34.5 9.3 (25%)    Number Index  Spontaneous 119 19.2  Apneas & Hypopneas 0 0.0  RERAs 0 0.0       (Apneas & Hypopneas & RERAs)  (0) (0.0)  Limb Movement 6 1.0  Snore 3 0.5  TOTAL 128 20.6     Respiratory Data:  CA OA MA Apnea Hypopnea* A+ H RERA Total  Number 0 0 0 0 0 0 0 0  Mean Dur (sec) 0.0 0.0 0.0 0.0 0.0 0.0 0.0 0.0  Max Dur (sec) 0.0 0.0 0.0 0.0 0.0 0.0 0.0 0.0  Total Dur (min) 0.0 0.0 0.0 0.0 0.0 0.0 0.0 0.0  % of TST 0.0 0.0 0.0 0.0 0.0 0.0 0.0 0.0  Index (#/h TST) 0.0 0.0 0.0 0.0 0.0 0.0 0.0 0.0  *Hypopneas scored based on 4% or greater desaturation.  Sleep Stage:        REM NREM TST  AHI 0.0 0.0 0.0  RDI 0.0 0.0 0.0           Body Position Data:  Sleep (min) TST (%) REM (min) NREM (min) CA (#) OA (#) MA (#) HYP (#) AHI (#/h) RERA (#) RDI (#/h) Desat (#)  Supine 43.0 11.54 0.0 43.0 0 0 0 0 0.0 0 0.00 3  Non-Supine 329.60 88.46 34.50 295.10 0.00 0.00 0.00 0.00 0.00 0 0.00 33.00  Left: 159.1 42.70 34.5 124.6 0 0 0 0 0.0 0 0.00 23  Right: 170.5 45.76 0.0 170.5 0 0 0 0 0.0 0 0.00 10     Snoring: Total number of snoring episodes  3  Total time with snoring 0.1 min (0.0 % of sleep)   Oximetry Distribution:             WK REM NREM TOTAL  Average (%)   95 95 95 95  < 90% 0.4 0.0 15.9 16.3  < 80% 0.0 0.0 0.0 0.0  < 70% 0.0 0.0 0.0 0.0  # of Desaturations* '3 5 28 '$ 36  Desat Index (#/hour) 5.4 8.7 5.1 5.9  Desat Max (%) '6 4 10 10  '$ Desat Max Dur (sec) 17.0 74.0 84.0 84.0   Approx Min O2 during sleep 86  Approx min O2 during a respiratory event     Was Oxygen added (Y/N) and final rate No:   0 LPM  *Desaturations based on 3% or greater drop from baseline.   Cheyne Stokes Breathing:  None Present   Heart Rate Summary:  Average Heart Rate During Sleep 62.8 bpm      Highest Heart Rate During Sleep (95th %) 79.0 bpm      Highest Heart Rate During Sleep 220 bpm (artifact)  Highest Heart Rate During Recording (TIB) 220 bpm (artifact)   Heart Rate Observations: Event Type # Events   Bradycardia 0 Lowest HR Scored: N/A  Sinus Tachycardia During Sleep 0 Highest HR Scored: N/A  Narrow Complex Tachycardia 0 Highest HR Scored: N/A  Wide Complex Tachycardia 0 Highest HR Scored: N/A  Asystole 0 Longest Pause: N/A  Atrial Fibrillation 0 Duration Longest Event: N/A  Other Arrythmias  No Type:    Periodic Limb Movement Data: (Primary legs unless otherwise noted) Total # Limb Movement 44 Limb Movement Index 7.1  Total # PLMS 42 PLMS Index 6.8  Total # PLMS Arousals 6 PLMS Arousal Index 1.0  Percentage Sleep Time with PLMS 26.49mn (7.0 % sleep)  Mean Duration limb movements (secs) 524.3

## 2020-12-19 ENCOUNTER — Ambulatory Visit: Payer: Federal, State, Local not specified - PPO | Admitting: Internal Medicine

## 2020-12-19 ENCOUNTER — Encounter: Payer: Self-pay | Admitting: Internal Medicine

## 2020-12-19 ENCOUNTER — Other Ambulatory Visit: Payer: Self-pay

## 2020-12-19 VITALS — BP 130/80 | HR 76 | Temp 97.8°F | Resp 16 | Ht 66.0 in | Wt 192.0 lb

## 2020-12-19 DIAGNOSIS — E039 Hypothyroidism, unspecified: Secondary | ICD-10-CM

## 2020-12-19 DIAGNOSIS — F418 Other specified anxiety disorders: Secondary | ICD-10-CM | POA: Diagnosis not present

## 2020-12-19 DIAGNOSIS — G4719 Other hypersomnia: Secondary | ICD-10-CM

## 2020-12-19 NOTE — Patient Instructions (Signed)
Hypersomnia Hypersomnia is a condition in which a person feels very tired during the day even though he or she gets plenty of sleep at night. A person with this condition may take naps during the day and may find it very difficult to wake up from sleep. Hypersomnia may affect a person's ability to think, concentrate,drive, or remember things. What are the causes? The cause of this condition may not be known. Possible causes include: Certain medicines. Sleep disorders, such as narcolepsy and sleep apnea. Injury to the head, brain, or spinal cord. Drug or alcohol use. Gastroesophageal reflux disease (GERD). Tumors. Certain medical conditions, such as depression, diabetes, or an underactive thyroid gland (hypothyroidism). What are the signs or symptoms? The main symptoms of hypersomnia include: Feeling very tired throughout the day, regardless of how much sleep you got the night before. Having trouble waking up. Others may find it difficult to wake you up when you are sleeping. Sleeping for longer and longer periods at a time. Taking naps throughout the day. Other symptoms may include: Feeling restless, anxious, or annoyed. Lacking energy. Having trouble with: Remembering. Speaking. Thinking. Loss of appetite. Seeing, hearing, tasting, smelling, or feeling things that are not real (hallucinations). How is this diagnosed? This condition may be diagnosed based on: Your symptoms and medical history. Your sleeping habits. Your health care provider may ask you to write down your sleeping habits in a daily sleep log, along with any symptoms you have. A series of tests that are done while you sleep (sleep study or polysomnogram). A test that measures how quickly you can fall asleep during the day (daytime nap study or multiple sleep latency test). How is this treated? Treatment can help you manage your condition. Treatment may include: Following a regular sleep routine. Lifestyle changes,  such as changing your eating habits, getting regular exercise, and avoiding alcohol or caffeinated beverages. Taking medicines to make you more alert (stimulants) during the day. Treating any underlying medical causes of hypersomnia. Follow these instructions at home: Sleep routine  Schedule the same bedtime and wake-up time each day. Practice a relaxing bedtime routine. This may include reading, meditation, deep breathing, or taking a warm bath before going to sleep. Get regular exercise each day. Avoid strenuous exercise in the evening hours. Keep your sleep environment at a cooler temperature, darkened, and quiet. Sleep with pillows and a mattress that are comfortable and supportive. Schedule short 20-minute naps for when you feel sleepiest during the day. Talk with your employer or teachers about your hypersomnia. If possible, adjust your schedule so that: You have a regular daytime work schedule. You can take a scheduled nap during the day. You do not have to work or be active at night. Do not eat a heavy meal for a few hours before bedtime. Eat your meals at about the same times every day. Avoid drinking alcohol or caffeinated beverages.  Safety  Do not drive or use heavy machinery if you are sleepy. Ask your health care provider if it is safe for you to drive. Wear a life jacket when swimming or spending time near water.  General instructions Take supplements and over-the-counter and prescription medicines only as told by your health care provider. Keep a sleep log that will help your doctor manage your condition. This may include information about: What time you go to bed each night. How often you wake up at night. How many hours you sleep at night. How often and for how long you nap during the   day. Any observations from others, such as leg movements during sleep, sleep walking, or snoring. Keep all follow-up visits as told by your health care provider. This is  important. Contact a health care provider if: You have new symptoms. Your symptoms get worse. Get help right away if: You have serious thoughts about hurting yourself or someone else. If you ever feel like you may hurt yourself or others, or have thoughts about taking your own life, get help right away. You can go to your nearest emergency department or call: Your local emergency services (911 in the [place] A suicide crisis helpline, such as the National Suicide Prevention Lifeline at 1-800-273-8255. This is open 24 hours a day. Summary Hypersomnia refers to a condition in which you feel very tired during the day even though you get plenty of sleep at night. A person with this condition may take naps during the day and may find it very difficult to wake up from sleep. Hypersomnia may affect a person's ability to think, concentrate, drive, or remember things. Treatment, such as following a regular sleep routine and making some lifestyle changes, can help you manage your condition. This information is not intended to replace advice given to you by your health care provider. Make sure you discuss any questions you have with your healthcare provider. Document Revised: 02/10/2020 Document Reviewed: 02/10/2020 Elsevier Patient Education  2022 Elsevier Inc.  

## 2020-12-19 NOTE — Progress Notes (Signed)
[facility] [place], [place] [address]  Pulmonary Sleep Medicine   Office Visit Note  Patient Name: Courtney Alvarez DOB: 1975/06/16 MRN [id number]  Date of Service: 12/19/2020  Complaints/HPI: Hypersomnia patient had a sleep study done the results of which we reviewed today in person.  It appears based on the sleep study that she did not have any significant obstructive sleep apnea however she did have an increase in her PLM which could be potentially contributing to her restlessness.  As far as the work-up for this is concerned we will defer to the primary care team but may want to consider iron evaluation.  The patient's overall apnea hypopnea index was 0  ROS  General: (-) fever, (-) chills, (-) night sweats, (-) weakness Skin: (-) rashes, (-) itching,. Eyes: (-) visual changes, (-) redness, (-) itching. Nose and Sinuses: (-) nasal stuffiness or itchiness, (-) postnasal drip, (-) nosebleeds, (-) sinus trouble. Mouth and Throat: (-) sore throat, (-) hoarseness. Neck: (-) swollen glands, (-) enlarged thyroid, (-) neck pain. Respiratory: - cough, (-) bloody sputum, - shortness of breath, - wheezing. Cardiovascular: - ankle swelling, (-) chest pain. Lymphatic: (-) lymph node enlargement. Neurologic: (-) numbness, (-) tingling. Psychiatric: (-) anxiety, (-) depression   Current Medication: Outpatient Encounter Medications as of 12/19/2020  Medication Sig Note   calcium carbonate (TUMS - DOSED IN MG ELEMENTAL CALCIUM) 500 MG chewable tablet Chew 1 tablet by mouth daily. 11/11/2016: PRN    Cholecalciferol (PA VITAMIN D-3 GUMMY PO) Take 2 tablets by mouth daily.     Crisaborole 2 % OINT Apply TO AFFECTED AREA(S) OF RASH ON ARMS EVERY DAY AS NEEDED    cyanocobalamin 1000 MCG tablet Take 1,000 mcg by mouth daily.    FLUoxetine HCl 60 MG TABS Take 60 mg by mouth every morning.     lansoprazole (PREVACID) 15 MG capsule     levonorgestrel (MIRENA) 20 MCG/24HR IUD 1  each by Intrauterine route once.    Multiple Vitamins-Minerals (MULTIVITAMIN GUMMIES WOMENS) CHEW Chew 2 tablets by mouth daily.     Omega-3 1000 MG CAPS Take by mouth.    ondansetron (ZOFRAN-ODT) 4 MG disintegrating tablet Take 1 tablet (4 mg total) by mouth every 8 (eight) hours as needed for nausea or vomiting.    temazepam (RESTORIL) 15 MG capsule Take 15 mg by mouth at bedtime as needed.    thyroid (NP THYROID) 120 MG tablet Take 1 tablet (120 mg total) by mouth daily before breakfast.    tiZANidine (ZANAFLEX) 4 MG tablet Take 1/2 tablet to 1 tablet every 6 hours as needed.  Caution for drowsiness.    venlafaxine XR (EFFEXOR-XR) 150 MG 24 hr capsule TK 1 C PO D    XIIDRA 5 % SOLN Place 1 drop into both eyes 2 (two) times daily.    Galcanezumab-gnlm (EMGALITY) 120 MG/ML SOAJ Inject 120 mg into the skin every 28 (twenty-eight) days. (Patient not taking: Reported on 12/19/2020)    No facility-administered encounter medications on file as of 12/19/2020.    Surgical History: Past Surgical History:  Procedure Laterality Date   BLADDER SURGERY     CERVICAL BIOPSY  W/ LOOP ELECTRODE EXCISION     COLONOSCOPY WITH PROPOFOL N/A 01/05/2016   Procedure: COLONOSCOPY WITH PROPOFOL;  Surgeon: Lucilla Lame, MD;  Location: [facility];  Service: Endoscopy;  Laterality: N/A;   FOOT SURGERY Left    POLYPECTOMY  01/05/2016   Procedure: POLYPECTOMY;  Surgeon: Lucilla Lame, MD;  Location: [facility];  Service: Endoscopy;;    Medical History: Past Medical History:  Diagnosis Date   Arthritis    Chronic fatigue syndrome    GERD (gastroesophageal reflux disease)    Headache    tension/cluster   Hypothyroidism    Motion sickness    Plantar fasciitis of left foot    PONV (postoperative nausea and vomiting)    Thyroid disease    Vaginal Pap smear, abnormal     Family History: Family History  Problem Relation Age of Onset   Arthritis Mother    COPD Mother    Thyroid disease Mother     Arthritis Father    Hyperlipidemia Father    Cancer Father        polyp removed   Heart disease Father    Hashimoto's thyroiditis Sister    Arthritis Maternal Grandmother    Cancer Maternal Grandmother        colon   Heart disease Maternal Grandmother    Thyroid disease Maternal Grandmother    Alcohol abuse Maternal Grandfather    Arthritis Maternal Grandfather    Arthritis Paternal Grandmother    Depression Paternal Grandmother    Hypertension Paternal Grandmother    Stroke Paternal Grandmother    Arthritis Paternal Grandfather    Heart disease Paternal Grandfather    Hypertension Paternal Grandfather    Breast cancer Other     Social History: Social History   Socioeconomic History   Marital status: Married    Spouse name: Annie Main   Number of children: 1   Years of education: Not on file   Highest education level: Associate degree: occupational, Hotel manager, or vocational program  Occupational History   Occupation: RN  Tobacco Use   Smoking status: Former    Types: Cigarettes    Quit date: 06/14/2003    Years since quitting: 17.5   Smokeless tobacco: Never   Tobacco comments:    quit 06/2003  Vaping Use   Vaping Use: Never used  Substance and Sexual Activity   Alcohol use: Yes    Alcohol/week: 0.0 standard drinks    Comment: occasionally    Drug use: No   Sexual activity: Yes    Partners: Male    Birth control/protection: I.U.D.    Comment: mirena  Other Topics Concern   Not on file  Social History Narrative   Not on file   Social Determinants of Health   Financial Resource Strain: Not on file  Food Insecurity: Not on file  Transportation Needs: Not on file  Physical Activity: Not on file  Stress: Not on file  Social Connections: Not on file  Intimate Partner Violence: Not on file    Vital Signs: Blood pressure 130/80, pulse 76, temperature 97.8 F (36.6 C), resp. rate 16, height '5\' 6"'$  (1.676 m), weight 192 lb (87.1 kg), SpO2 98  %.  Examination: General Appearance: The patient is well-developed, well-nourished, and in no distress. Skin: Gross inspection of skin unremarkable. Head: normocephalic, no gross deformities. Eyes: no gross deformities noted. ENT: ears appear grossly normal no exudates. Neck: Supple. No thyromegaly. No LAD. Respiratory: no rhonchi noted. Cardiovascular: Normal S1 and S2 without murmur or rub. Extremities: No cyanosis. pulses are equal. Neurologic: Alert and oriented. No involuntary movements.  LABS: No results found for this or any previous visit (from the past 2160 hour(s)).  Radiology: MR Cervical Spine w/o contrast  Result Date: 07/29/2020 CLINICAL DATA:  Initial evaluation for neck pain with numbness in right arm  and hand for 3-4 years. EXAM: MRI CERVICAL SPINE WITHOUT CONTRAST TECHNIQUE: Multiplanar, multisequence MR imaging of the cervical spine was performed. No intravenous contrast was administered. COMPARISON:  Previous MRI from 12/26/2016 FINDINGS: Alignment: Straightening of the normal cervical lordosis. Trace retrolisthesis of C4 on C5, with trace anterolisthesis of C6 on C7, chronic and facet mediated. Vertebrae: Vertebral body height maintained without acute or chronic fracture. Bone marrow signal intensity within normal limits. Subcentimeter benign hemangioma noted within the T1 vertebral body. No other discrete or worrisome osseous lesions. No abnormal marrow edema. Cord: Normal signal and morphology. Posterior Fossa, vertebral arteries, paraspinal tissues: Visualized brain and posterior fossa within normal limits. Craniocervical junction normal. Paraspinous and prevertebral soft tissues within normal limits. Normal intravascular flow voids seen within the vertebral arteries bilaterally. Disc levels: C2-C3: Normal interspace. Left-sided facet hypertrophy. No significant spinal stenosis. Mild left C3 foraminal narrowing. Right neural foramen remains patent. C3-C4: Mild annular disc  bulge with uncovertebral hypertrophy. Mild left-sided facet hypertrophy. No spinal stenosis. Mild to moderate left C4 foraminal stenosis. Right neural foramina remains patent. C4-C5: Trace retrolisthesis. Broad-based posterior disc bulge flattens and partially effaces the ventral thecal sac, eccentric to the right. No significant spinal stenosis or cord deformity. Moderate right worse than left C5 foraminal stenosis. C5-C6: Broad-based posterior disc bulge flattens and partially effaces the ventral thecal sac without significant spinal stenosis or cord impingement. Moderate bilateral C6 foraminal narrowing, slightly worse on the right. C6-C7: Trace anterolisthesis. Mild disc bulge with uncovertebral hypertrophy. Flattening of the ventral thecal sac without significant spinal stenosis. Mild bilateral C7 foraminal stenosis. C7-T1:  Normal interspace.  Mild facet hypertrophy.  No stenosis. Visualized upper thoracic spine demonstrates no significant finding. IMPRESSION: 1. Mild multilevel cervical spondylosis without significant spinal stenosis. 2. Multifactorial degenerative changes with resultant multilevel foraminal narrowing as above. Notable findings include mild left C3 foraminal stenosis, mild to moderate left C4 foraminal narrowing, moderate right worse than left C5 and C6 foraminal stenosis, with mild bilateral C7 foraminal narrowing. Electronically Signed   By: Jeannine Boga M.D.   On: 07/29/2020 20:19    No results found.  No results found.    Assessment and Plan: Patient Active Problem List   Diagnosis Date Noted   Other hypersomnia 12/12/2020   IUD (intrauterine device) in place 05/11/2020   Anxiety with depression 09/09/2019   Gastroesophageal reflux disease without esophagitis 08/26/2018   Moderate episode of recurrent major depressive disorder ([place]) 08/26/2018   GAD (generalized anxiety disorder) 08/26/2018   Dry eyes 08/26/2018   Immune to varicella 06/01/2017   Family history  of colonic polyps    Benign neoplasm of sigmoid colon    Family history of colon cancer 07/31/2015   Obesity, morbid ([place]) 07/31/2015   Loss of feeling or sensation 06/22/2015   Chronic pain in left foot 06/22/2015   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[place]) 12/16/2013   Arthralgia of multiple joints 12/16/2013   Goiter, nontoxic, 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   Adult hypothyroidism 09/07/2013    1. Other hypersomnia Spoke to her about sleep hygiene spoke to her about PLM and how it could be contributing to her increase in hypersomnia.  2. Obesity, morbid ([place]) Obesity Counseling: Had a lengthy discussion regarding patients BMI and weight issues. Patient was instructed on portion control as well as increased activity. Also discussed caloric restrictions with trying to maintain intake less than 2000 Kcal. Discussions were made in accordance with the 5As of weight management. Simple actions such as not  eating late and if able to, taking a walk is suggested.    3. Anxiety disorder She will continue to work with her therapist as far as the anxiety issues are concerned.  She seems to be slowly getting better control.  This also could be affecting her as far as her sleep is concerned  4. Hypothyroid Defer to the primary care team  General Counseling: I have discussed the findings of the evaluation and examination with Lerline.  I have also discussed any further diagnostic evaluation thatmay be needed or ordered today. Joselyne verbalizes understanding of the findings of todays visit. We also reviewed her medications today and discussed drug interactions and side effects including but not limited excessive drowsiness and altered mental states. We also discussed that there is always a risk not just to her but also people around her. she has been encouraged to call the office with any questions or concerns that should arise related to todays visit.  No orders of the defined types were  placed in this encounter.    Time spent: 13  I have personally obtained a history, examined the patient, evaluated laboratory and imaging results, formulated the assessment and plan and placed orders.    Allyne Gee, MD [facility] Pulmonary and Critical Care Sleep medicine

## 2020-12-28 ENCOUNTER — Encounter: Payer: Self-pay | Admitting: Physical Medicine and Rehabilitation

## 2021-01-01 ENCOUNTER — Encounter: Payer: Federal, State, Local not specified - PPO | Admitting: Certified Nurse Midwife

## 2021-01-02 ENCOUNTER — Encounter: Payer: Self-pay | Admitting: Nurse Practitioner

## 2021-01-02 ENCOUNTER — Other Ambulatory Visit: Payer: Self-pay

## 2021-01-02 ENCOUNTER — Ambulatory Visit: Payer: Federal, State, Local not specified - PPO | Admitting: Nurse Practitioner

## 2021-01-02 VITALS — BP 124/76 | HR 82 | Temp 98.3°F | Resp 16 | Ht 66.0 in | Wt 191.0 lb

## 2021-01-02 DIAGNOSIS — E039 Hypothyroidism, unspecified: Secondary | ICD-10-CM

## 2021-01-02 DIAGNOSIS — R5383 Other fatigue: Secondary | ICD-10-CM

## 2021-01-02 DIAGNOSIS — G4700 Insomnia, unspecified: Secondary | ICD-10-CM

## 2021-01-02 DIAGNOSIS — Z130 Encounter for screening for diseases of the blood and blood-forming organs and certain disorders involving the immune mechanism: Secondary | ICD-10-CM

## 2021-01-02 DIAGNOSIS — Z79899 Other long term (current) drug therapy: Secondary | ICD-10-CM

## 2021-01-02 DIAGNOSIS — E559 Vitamin D deficiency, unspecified: Secondary | ICD-10-CM | POA: Diagnosis not present

## 2021-01-02 DIAGNOSIS — Z13220 Encounter for screening for lipoid disorders: Secondary | ICD-10-CM

## 2021-01-02 DIAGNOSIS — Z131 Encounter for screening for diabetes mellitus: Secondary | ICD-10-CM

## 2021-01-02 MED ORDER — THYROID 120 MG PO TABS: 120.0000 mg | ORAL_TABLET | Freq: Every day | ORAL | 0 refills | Status: DC

## 2021-01-02 NOTE — Progress Notes (Signed)
BP 124/76   Pulse 82   Temp 98.3 F (36.8 C)   Resp 16   Ht 5\' 6"  (1.676 m)   Wt 191 lb (86.6 kg)   SpO2 98%   BMI 30.83 kg/m    Subjective:    Patient ID: Courtney Alvarez, female    DOB: Sep 07, 1975, 45 y.o.   MRN: [id number]  HPI: Courtney Alvarez is a 45 y.o. female, here with daughter  Chief Complaint  Patient presents with   Follow-up  Hypothyroidism: Taking NP thyroid 120 mg.  She says she has been on this dose for about 6-7 years.  She says she does have fatigue and dry skin. She denies any chest pain or palpitations. Has not had level checked in awhile. Will get labs today.   Insomnia: She says she has had trouble staying asleep.  She has tried medication without success.  Had sleep done and it was negative.  She says she is getting about 7 hours.  She is not taking naps.  She does drink a lot of coffee.  Last cup of coffee before dinner.  Discussed cutting back on coffee and last cup being before lunch.   Fatigue: she says she has been feeling a little fatigued for a few months.  She says she is not sure if it is her thyroid or something else. She recently had a sleep study and it was negative.  Discussed checking labs.  Relevant past medical, surgical, family and social history reviewed and updated as indicated. Interim medical history since our last visit reviewed. Allergies and medications reviewed and updated.  Review of Systems  Constitutional: Negative for fever or weight change.  Respiratory: Negative for cough and shortness of breath.   Cardiovascular: Negative for chest pain or palpitations.  Gastrointestinal: Negative for abdominal pain, no bowel changes.  Musculoskeletal: Negative for gait problem or joint swelling.  Skin: Negative for rash.  Neurological: Negative for dizziness or headache.  No other specific complaints in a complete review of systems (except as listed in HPI above).      Objective:    BP 124/76   Pulse 82   Temp 98.3 F (36.8 C)    Resp 16   Ht 5\' 6"  (1.676 m)   Wt 191 lb (86.6 kg)   SpO2 98%   BMI 30.83 kg/m   Wt Readings from Last 3 Encounters:  01/02/21 191 lb (86.6 kg)  12/19/20 192 lb (87.1 kg)  10/09/20 192 lb (87.1 kg)    Physical Exam  Constitutional: Patient appears well-developed and well-nourished. No distress.  HEENT: head atraumatic, normocephalic, pupils equal and reactive to light,neck supple Cardiovascular: Normal rate, regular rhythm and normal heart sounds.  No murmur heard. No BLE edema. Pulmonary/Chest: Effort normal and breath sounds normal. No respiratory distress. Abdominal: Soft.  There is no tenderness. Psychiatric: Patient has a normal mood and affect. behavior is normal. Judgment and thought content normal.   Results for orders placed or performed in visit on 05/11/20  Estradiol  Result Value Ref Range   Estradiol 75.0 pg/mL  Testosterone, Free, Total, SHBG  Result Value Ref Range   Testosterone 11 4 - 50 ng/dL   Testosterone, Free 1.6 0.0 - 4.2 pg/mL   Sex Hormone Binding 48.1 24.6 - 122.0 nmol/L  FSH/LH  Result Value Ref Range   LH 6.3 mIU/mL   FSH 8.1 mIU/mL  Progesterone  Result Value Ref Range   Progesterone 0.5 ng/mL  POCT urine pregnancy  Result Value Ref Range   Preg Test, Ur Negative Negative      Assessment & Plan:   1. Adult hypothyroidism  - TSH - thyroid (NP THYROID) 120 MG tablet; Take 1 tablet (120 mg total) by mouth daily before breakfast.  Dispense: 30 tablet; Refill: 0  2. Insomnia, unspecified type -decrease caffeine intake  3. Fatigue, unspecified type  - B12 and Folate Panel  4. Vitamin D deficiency  - VITAMIN D 25 Hydroxy (Vit-D Deficiency, Fractures)  5. Screening for diabetes mellitus  - Hemoglobin A1c  6. Screening for cholesterol level  - Lipid panel  7. Long-term use of high-risk medication  - CBC with Differential/Platelet - COMPLETE METABOLIC PANEL WITH GFR  8. Screening for deficiency anemia  - CBC with  Differential/Platelet   Follow up plan: Return in about 3 months (around 04/03/2021) for cpe.

## 2021-01-03 LAB — COMPLETE METABOLIC PANEL WITH GFR
AG Ratio: 1.6 (calc) (ref 1.0–2.5)
ALT: 11 U/L (ref 6–29)
AST: 15 U/L (ref 10–35)
Albumin: 4.2 g/dL (ref 3.6–5.1)
Alkaline phosphatase (APISO): 53 U/L (ref 31–125)
BUN: 20 mg/dL (ref 7–25)
CO2: 30 mmol/L (ref 20–32)
Calcium: 9 mg/dL (ref 8.6–10.2)
Chloride: 103 mmol/L (ref 98–110)
Creat: 0.84 mg/dL (ref 0.50–0.99)
Globulin: 2.6 g/dL (calc) (ref 1.9–3.7)
Glucose, Bld: 88 mg/dL (ref 65–99)
Potassium: 4.2 mmol/L (ref 3.5–5.3)
Sodium: 139 mmol/L (ref 135–146)
Total Bilirubin: 0.3 mg/dL (ref 0.2–1.2)
Total Protein: 6.8 g/dL (ref 6.1–8.1)
eGFR: 87 mL/min/{1.73_m2} (ref >=60)

## 2021-01-03 LAB — CBC WITH DIFFERENTIAL/PLATELET
Absolute Monocytes: 493 cells/uL (ref 200–950)
Basophils Absolute: 51 cells/uL (ref 0–200)
Basophils Relative: 0.8 %
Eosinophils Absolute: 141 cells/uL (ref 15–500)
Eosinophils Relative: 2.2 %
HCT: 40.6 % (ref 35.0–45.0)
Hemoglobin: 13.1 g/dL (ref 11.7–15.5)
Lymphs Abs: 2330 cells/uL (ref 850–3900)
MCH: 29.1 pg (ref 27.0–33.0)
MCHC: 32.3 g/dL (ref 32.0–36.0)
MCV: 90.2 fL (ref 80.0–100.0)
MPV: 10.4 fL (ref 7.5–12.5)
Monocytes Relative: 7.7 %
Neutro Abs: 3386 cells/uL (ref 1500–7800)
Neutrophils Relative %: 52.9 %
Platelets: 285 10*3/uL (ref 140–400)
RBC: 4.5 10*6/uL (ref 3.80–5.10)
RDW: 12.1 % (ref 11.0–15.0)
Total Lymphocyte: 36.4 %
WBC: 6.4 10*3/uL (ref 3.8–10.8)

## 2021-01-03 LAB — B12 AND FOLATE PANEL
Folate: 14.3 ng/mL
Vitamin B-12: 800 pg/mL (ref 200–1100)

## 2021-01-03 LAB — LIPID PANEL
Cholesterol: 206 mg/dL — ABNORMAL HIGH (ref <200)
HDL: 61 mg/dL (ref >=50)
LDL Cholesterol (Calc): 120 mg/dL (calc) — ABNORMAL HIGH
Non-HDL Cholesterol (Calc): 145 mg/dL (calc) — ABNORMAL HIGH (ref <130)
Total CHOL/HDL Ratio: 3.4 (calc) (ref <5.0)
Triglycerides: 141 mg/dL (ref <150)

## 2021-01-03 LAB — HEMOGLOBIN A1C
Hgb A1c MFr Bld: 4.9 % of total Hgb (ref <5.7)
Mean Plasma Glucose: 94 mg/dL
eAG (mmol/L): 5.2 mmol/L

## 2021-01-03 LAB — VITAMIN D 25 HYDROXY (VIT D DEFICIENCY, FRACTURES): Vit D, 25-Hydroxy: 50 ng/mL (ref 30–100)

## 2021-01-03 LAB — TSH: TSH: 0.49 mIU/L

## 2021-01-17 ENCOUNTER — Encounter: Payer: Self-pay | Admitting: Neurology

## 2021-01-25 ENCOUNTER — Encounter: Payer: Self-pay | Admitting: Neurology

## 2021-01-30 ENCOUNTER — Telehealth (INDEPENDENT_AMBULATORY_CARE_PROVIDER_SITE_OTHER): Payer: Federal, State, Local not specified - PPO | Admitting: Nurse Practitioner

## 2021-01-30 ENCOUNTER — Other Ambulatory Visit: Payer: Self-pay

## 2021-01-30 DIAGNOSIS — F419 Anxiety disorder, unspecified: Secondary | ICD-10-CM | POA: Insufficient documentation

## 2021-01-30 DIAGNOSIS — Z9189 Other specified personal risk factors, not elsewhere classified: Secondary | ICD-10-CM | POA: Insufficient documentation

## 2021-01-30 DIAGNOSIS — M542 Cervicalgia: Secondary | ICD-10-CM | POA: Insufficient documentation

## 2021-01-30 DIAGNOSIS — M259 Joint disorder, unspecified: Secondary | ICD-10-CM | POA: Insufficient documentation

## 2021-01-30 DIAGNOSIS — Z9182 Personal history of military deployment: Secondary | ICD-10-CM | POA: Insufficient documentation

## 2021-01-30 DIAGNOSIS — J069 Acute upper respiratory infection, unspecified: Secondary | ICD-10-CM

## 2021-01-30 DIAGNOSIS — Z049 Encounter for examination and observation for unspecified reason: Secondary | ICD-10-CM | POA: Insufficient documentation

## 2021-01-30 DIAGNOSIS — E041 Nontoxic single thyroid nodule: Secondary | ICD-10-CM | POA: Insufficient documentation

## 2021-01-30 DIAGNOSIS — J329 Chronic sinusitis, unspecified: Secondary | ICD-10-CM | POA: Insufficient documentation

## 2021-01-30 DIAGNOSIS — G5620 Lesion of ulnar nerve, unspecified upper limb: Secondary | ICD-10-CM | POA: Insufficient documentation

## 2021-01-30 DIAGNOSIS — Z124 Encounter for screening for malignant neoplasm of cervix: Secondary | ICD-10-CM | POA: Insufficient documentation

## 2021-01-30 NOTE — Progress Notes (Signed)
Name: MAELYN BERREY   MRN: [id number]    DOB: 1975-06-19   Date:01/30/2021       Progress Note  Subjective  Chief Complaint  Chief Complaint  Patient presents with   URI    Congested, fatigue, headache, cough, drainage for 2 days. Covid test x 2 negative    I connected with  Momina D Brodhead  on 01/30/21 at  3:20 PM EDT by a video enabled telemedicine application and verified that I am speaking with the correct person using two identifiers.  I discussed the limitations of evaluation and management by telemedicine and the availability of in person appointments. The patient expressed understanding and agreed to proceed with a virtual visit  Staff also discussed with the patient that there may be a patient responsible charge related to this service. Patient Location: home Provider Location: [facility] Additional Individuals present: alone  HPI  URI: She says her symptoms started yesterday.  Symptoms include nasal congestion, loss of smell and taste, sore throat, fatigue and headache.  She also reports a cough in the morning but not much through the day.  She says that she has had two negative at home covid tests.  She is going for a PCR this afternoon and will call with those results.  She has been treating her symptoms with mucinex, sudafed and ibuprofen.  She denies any shortness of breath or fever.  She will call when she has the results of her Covid PCR test.  Discussed OTC treatment she can add to current treatment and pushing fluids.  Also discussed Paxlovid treatment if she is positive.  Discussed reasons to seek emergency care.    Patient Active Problem List   Diagnosis Date Noted   Vitamin D deficiency 01/02/2021   Other hypersomnia 12/12/2020   IUD (intrauterine device) in place 05/11/2020   Anxiety with depression 09/09/2019   Gastroesophageal reflux disease without esophagitis 08/26/2018   Moderate episode of recurrent major depressive disorder ([place]) 08/26/2018   GAD (generalized  anxiety disorder) 08/26/2018   Dry eyes 08/26/2018   Immune to varicella 06/01/2017   Family history of colonic polyps    Benign neoplasm of sigmoid colon    Family history of colon cancer 07/31/2015   Obesity, morbid ([place]) 07/31/2015   Loss of feeling or sensation 06/22/2015   Chronic pain in left foot 06/22/2015   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[place]) 12/16/2013   Arthralgia of multiple joints 12/16/2013   Goiter, nontoxic, 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 , Disp: , Rfl:    cyanocobalamin 1000 MCG tablet, Take 1,000 mcg by mouth daily., Disp: , Rfl:    FLUoxetine HCl 60 MG TABS, Take 60 mg by mouth every morning. , Disp: , Rfl:    lansoprazole (PREVACID) 15 MG capsule, , Disp: , Rfl:    levonorgestrel (MIRENA) 20 MCG/24HR IUD, 1 each by Intrauterine route once., Disp: , Rfl:    Multiple Vitamins-Minerals (MULTIVITAMIN GUMMIES WOMENS)  CHEW, Chew 2 tablets by mouth daily. , Disp: , Rfl:    Omega-3 1000 MG CAPS, Take by mouth., Disp: , Rfl:    thyroid (NP THYROID) 120 MG tablet, Take 1 tablet (120 mg total) by mouth daily before breakfast., Disp: 30 tablet, Rfl: 0   venlafaxine XR (EFFEXOR-XR) 150 MG 24 hr capsule, TK 1 C PO D, Disp: , Rfl: 1   XIIDRA 5 % SOLN, Place 1 drop into both eyes 2 (two) times daily., Disp: , Rfl:   No Known Allergies  I personally reviewed active problem list, medication list, allergies with the patient/caregiver today.  ROS  Constitutional: Negative for fever or weight change.  HEENT:  positive for sore throat, nasal congestion and loss of taste and smell Respiratory: Positive for cough, negative for shortness of breath.   Cardiovascular: Negative for chest pain or palpitations.  Gastrointestinal: Negative for abdominal pain, no bowel changes.  Musculoskeletal: Negative for gait problem or joint swelling.  Skin: Negative for rash.  Neurological: Negative for dizziness, positive for headache and fatigue  No other specific complaints in a complete review of systems (except as listed in HPI above).   Objective  Virtual encounter, vitals not obtained.  There is no height or weight on file to calculate BMI.  Nursing Note and Vital Signs reviewed.  Physical Exam  Awake, alert and oriented, speaking in complete sentences  No results found for this or any previous visit (from the past 72 hour(s)).  Assessment & Plan  1. Viral upper respiratory tract infection -Covid PCR test, through work will call with results -OTC treatments and push fluids -discussed CDC guidelines of quarantine  -Red flags and when to present for emergency care or RTC including fever >101.35F, chest pain, shortness of breath, new/worsening/un-resolving symptoms, reviewed with patient at time of visit. Follow up and care instructions discussed and provided in AVS. - I discussed the assessment and treatment plan with the patient. The patient was provided an opportunity to ask questions and all were answered. The patient agreed with the plan and demonstrated an understanding of the instructions.  I provided 15 minutes of non-face-to-face time during this encounter.  Bo Merino, FNP

## 2021-01-31 ENCOUNTER — Encounter: Payer: Self-pay | Admitting: Nurse Practitioner

## 2021-02-02 ENCOUNTER — Other Ambulatory Visit: Payer: Self-pay

## 2021-02-02 ENCOUNTER — Telehealth (INDEPENDENT_AMBULATORY_CARE_PROVIDER_SITE_OTHER): Payer: Federal, State, Local not specified - PPO | Admitting: Nurse Practitioner

## 2021-02-02 ENCOUNTER — Encounter: Payer: Self-pay | Admitting: Nurse Practitioner

## 2021-02-02 ENCOUNTER — Other Ambulatory Visit: Payer: Self-pay | Admitting: Nurse Practitioner

## 2021-02-02 DIAGNOSIS — J0140 Acute pansinusitis, unspecified: Secondary | ICD-10-CM

## 2021-02-02 DIAGNOSIS — E039 Hypothyroidism, unspecified: Secondary | ICD-10-CM | POA: Diagnosis not present

## 2021-02-02 MED ORDER — AMOXICILLIN-POT CLAVULANATE 875-125 MG PO TABS: 1.0000 | ORAL_TABLET | Freq: Two times a day (BID) | ORAL | 0 refills | Status: AC

## 2021-02-02 MED ORDER — THYROID 120 MG PO TABS: 120.0000 mg | ORAL_TABLET | Freq: Every day | ORAL | 3 refills | Status: DC

## 2021-02-02 NOTE — Progress Notes (Signed)
Name: Courtney Alvarez   MRN: [id number]    DOB: 1976/04/14   Date:02/02/2021       Progress Note  Subjective  Chief Complaint  Chief Complaint  Patient presents with   Follow-up    Covid test negative, still congested, left ear pain, sinus sinus pressure    I connected with  Kiri D Gartland  on 02/02/21 at  3:20 PM EDT by a video enabled telemedicine application and verified that I am speaking with the correct person using two identifiers.  I discussed the limitations of evaluation and management by telemedicine and the availability of in person appointments. The patient expressed understanding and agreed to proceed with a virtual visit  Staff also discussed with the patient that there may be a patient responsible charge related to this service. Patient Location: work [employer] Location: [facility] Additional Individuals present: alone  HPI  Sinusitis: She says she is not feeling any better from last appointment. She has been having upper respiratory symptoms which included cough, fatigue, nasal congestion and headache. She has had symptoms for about a week. She has had a negative Covid PCR test.  Her symptoms have improved except now she is having sinus pressure, ear pain and tenderness to her face. She denies any fever.  She also report thick nasal drainage.  She has been doing OTC treatments discussed at last appointment.  Will start antibiotic for sinus infection.  Continue to push fluids and continue OTC treatments.   Hypothyroidism:  She is currently on 120 mg NP thyroid daily.  She is tolerating that dose well.  Last TSH was 0.49, on 01/02/21.  She denies any symptoms.  Will send in refill.   Patient Active Problem List   Diagnosis Date Noted   Anxiety 01/30/2021   Cervicalgia 01/30/2021   Disorder of upper arm joint 01/30/2021   Lesion of ulnar nerve 01/30/2021   Nontoxic uninodular goiter 01/30/2021   Observation for suspected condition 01/30/2021   Other personal history presenting  hazards to health 01/30/2021   Personal history of return from [employer] deployment 01/30/2021   Screening for malignant neoplasm of cervix 01/30/2021   Sinusitis 01/30/2021   Vitamin D deficiency 01/02/2021   Other hypersomnia 12/12/2020   IUD (intrauterine device) in place 05/11/2020   Anxiety with depression 09/09/2019   Gastroesophageal reflux disease without esophagitis 08/26/2018   Moderate episode of recurrent major depressive disorder ([place]) 08/26/2018   GAD (generalized anxiety disorder) 08/26/2018   Dry eyes 08/26/2018   Immune to varicella 06/01/2017   Family history of colonic polyps    Benign neoplasm of sigmoid colon    Family history of colon cancer 07/31/2015   Obesity, morbid ([place]) 07/31/2015   Loss of feeling or sensation 06/22/2015   Chronic pain in left foot 06/22/2015   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[place]) 12/16/2013   Arthralgia of multiple joints 12/16/2013   Goiter, nontoxic, 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 , Disp: , Rfl:    cyanocobalamin 1000 MCG tablet, Take 1,000 mcg by mouth daily., Disp: , Rfl:    FLUoxetine HCl 60 MG TABS, Take 60 mg by mouth every morning. , Disp: , Rfl:    lansoprazole (PREVACID) 15 MG capsule, , Disp: , Rfl:    levonorgestrel (MIRENA) 20 MCG/24HR IUD, 1 each by Intrauterine route once., Disp: , Rfl:    Multiple Vitamins-Minerals  (MULTIVITAMIN GUMMIES WOMENS) CHEW, Chew 2 tablets by mouth daily. , Disp: , Rfl:    Omega-3 1000 MG CAPS, Take by mouth., Disp: , Rfl:    venlafaxine XR (EFFEXOR-XR) 150 MG 24 hr capsule, TK 1 C PO D, Disp: , Rfl: 1   XIIDRA 5 % SOLN, Place 1 drop into both eyes 2 (two) times daily., Disp: , Rfl:    thyroid (NP THYROID) 120 MG tablet, Take 1 tablet (120 mg total) by mouth daily before breakfast., Disp: 90 tablet, Rfl: 3  No Known Allergies  I personally reviewed active problem list, medication list, allergies with the patient/caregiver today.  ROS  Constitutional: Negative for fever or weight change.  HEENT: positive for sinus pain and pressure, and left ear pain Respiratory: Negative for cough and shortness of breath.   Cardiovascular: Negative for chest pain or palpitations.  Gastrointestinal: Negative for abdominal pain, no bowel changes.  Musculoskeletal: Negative for gait problem or joint swelling.  Skin: Negative for rash.  Neurological: Negative for dizziness or headache.  No other specific complaints in a complete review of systems (except as listed in HPI above).   Objective  Virtual encounter, vitals not obtained.  There is no height or weight on file to calculate BMI.  Nursing Note and Vital Signs reviewed.  Physical Exam  Awake, alert and oriented, speaking in complete sentences  No results found for this or any previous visit (from the past 72 hour(s)).  Assessment & Plan  1. Acute non-recurrent pansinusitis -continue pushing fluids -continue OTC treatments for symptoms - amoxicillin-clavulanate (AUGMENTIN) 875-125 MG tablet; Take 1 tablet by mouth 2 (two) times daily for 10 days.  Dispense: 20 tablet; Refill: 0  2. Adult hypothyroidism  - thyroid (NP THYROID) 120 MG tablet; Take 1 tablet (120 mg total) by mouth daily before breakfast.  Dispense: 90 tablet; Refill: 3   -Red flags and when to present for emergency care or RTC including fever >101.63F, chest  pain, shortness of breath, new/worsening/un-resolving symptoms, reviewed with patient at time of visit. Follow up and care instructions discussed and provided in AVS. - I discussed the assessment and treatment plan with the patient. The patient was provided an opportunity to ask questions and all were answered. The patient agreed with the plan and demonstrated an understanding of the instructions.  I provided 20 minutes of non-face-to-face time during this encounter.  Bo Merino, FNP

## 2021-02-06 DIAGNOSIS — F5105 Insomnia due to other mental disorder: Secondary | ICD-10-CM | POA: Diagnosis not present

## 2021-02-06 DIAGNOSIS — F321 Major depressive disorder, single episode, moderate: Secondary | ICD-10-CM | POA: Diagnosis not present

## 2021-02-06 DIAGNOSIS — F331 Major depressive disorder, recurrent, moderate: Secondary | ICD-10-CM | POA: Diagnosis not present

## 2021-02-06 DIAGNOSIS — F411 Generalized anxiety disorder: Secondary | ICD-10-CM | POA: Diagnosis not present

## 2021-02-09 ENCOUNTER — Encounter: Payer: Self-pay | Admitting: Nurse Practitioner

## 2021-03-13 DIAGNOSIS — F411 Generalized anxiety disorder: Secondary | ICD-10-CM | POA: Diagnosis not present

## 2021-03-13 DIAGNOSIS — F331 Major depressive disorder, recurrent, moderate: Secondary | ICD-10-CM | POA: Diagnosis not present

## 2021-03-14 DIAGNOSIS — F411 Generalized anxiety disorder: Secondary | ICD-10-CM | POA: Diagnosis not present

## 2021-03-14 DIAGNOSIS — F5105 Insomnia due to other mental disorder: Secondary | ICD-10-CM | POA: Diagnosis not present

## 2021-03-14 DIAGNOSIS — R5382 Chronic fatigue, unspecified: Secondary | ICD-10-CM | POA: Diagnosis not present

## 2021-03-14 DIAGNOSIS — F321 Major depressive disorder, single episode, moderate: Secondary | ICD-10-CM | POA: Diagnosis not present

## 2021-03-20 ENCOUNTER — Encounter: Payer: Self-pay | Admitting: Nurse Practitioner

## 2021-04-03 ENCOUNTER — Ambulatory Visit: Payer: Federal, State, Local not specified - PPO | Admitting: Neurology

## 2021-04-04 ENCOUNTER — Encounter: Payer: Federal, State, Local not specified - PPO | Admitting: Nurse Practitioner

## 2021-04-17 DIAGNOSIS — M9901 Segmental and somatic dysfunction of cervical region: Secondary | ICD-10-CM | POA: Diagnosis not present

## 2021-04-17 DIAGNOSIS — M9903 Segmental and somatic dysfunction of lumbar region: Secondary | ICD-10-CM | POA: Diagnosis not present

## 2021-04-17 DIAGNOSIS — M62830 Muscle spasm of back: Secondary | ICD-10-CM | POA: Diagnosis not present

## 2021-04-17 DIAGNOSIS — M5412 Radiculopathy, cervical region: Secondary | ICD-10-CM | POA: Diagnosis not present

## 2021-04-17 DIAGNOSIS — M62431 Contracture of muscle, right forearm: Secondary | ICD-10-CM | POA: Diagnosis not present

## 2021-04-17 DIAGNOSIS — M9907 Segmental and somatic dysfunction of upper extremity: Secondary | ICD-10-CM | POA: Diagnosis not present

## 2021-04-19 DIAGNOSIS — M9901 Segmental and somatic dysfunction of cervical region: Secondary | ICD-10-CM | POA: Diagnosis not present

## 2021-04-19 DIAGNOSIS — M5412 Radiculopathy, cervical region: Secondary | ICD-10-CM | POA: Diagnosis not present

## 2021-04-19 DIAGNOSIS — M62431 Contracture of muscle, right forearm: Secondary | ICD-10-CM | POA: Diagnosis not present

## 2021-04-19 DIAGNOSIS — M9903 Segmental and somatic dysfunction of lumbar region: Secondary | ICD-10-CM | POA: Diagnosis not present

## 2021-04-19 DIAGNOSIS — M9907 Segmental and somatic dysfunction of upper extremity: Secondary | ICD-10-CM | POA: Diagnosis not present

## 2021-04-19 DIAGNOSIS — M62830 Muscle spasm of back: Secondary | ICD-10-CM | POA: Diagnosis not present

## 2021-04-23 DIAGNOSIS — M62431 Contracture of muscle, right forearm: Secondary | ICD-10-CM | POA: Diagnosis not present

## 2021-04-23 DIAGNOSIS — M9901 Segmental and somatic dysfunction of cervical region: Secondary | ICD-10-CM | POA: Diagnosis not present

## 2021-04-23 DIAGNOSIS — M5412 Radiculopathy, cervical region: Secondary | ICD-10-CM | POA: Diagnosis not present

## 2021-04-23 DIAGNOSIS — M62830 Muscle spasm of back: Secondary | ICD-10-CM | POA: Diagnosis not present

## 2021-04-23 DIAGNOSIS — M9907 Segmental and somatic dysfunction of upper extremity: Secondary | ICD-10-CM | POA: Diagnosis not present

## 2021-04-23 DIAGNOSIS — M9903 Segmental and somatic dysfunction of lumbar region: Secondary | ICD-10-CM | POA: Diagnosis not present

## 2021-04-25 ENCOUNTER — Other Ambulatory Visit: Payer: Self-pay

## 2021-04-25 ENCOUNTER — Ambulatory Visit: Payer: Federal, State, Local not specified - PPO | Admitting: Physician Assistant

## 2021-04-25 ENCOUNTER — Encounter: Payer: Self-pay | Admitting: Physician Assistant

## 2021-04-25 VITALS — BP 124/86 | HR 87 | Resp 20 | Ht 66.0 in | Wt 195.0 lb

## 2021-04-25 DIAGNOSIS — M62431 Contracture of muscle, right forearm: Secondary | ICD-10-CM | POA: Diagnosis not present

## 2021-04-25 DIAGNOSIS — M542 Cervicalgia: Secondary | ICD-10-CM

## 2021-04-25 DIAGNOSIS — G43009 Migraine without aura, not intractable, without status migrainosus: Secondary | ICD-10-CM | POA: Diagnosis not present

## 2021-04-25 DIAGNOSIS — M9903 Segmental and somatic dysfunction of lumbar region: Secondary | ICD-10-CM | POA: Diagnosis not present

## 2021-04-25 DIAGNOSIS — M9901 Segmental and somatic dysfunction of cervical region: Secondary | ICD-10-CM | POA: Diagnosis not present

## 2021-04-25 DIAGNOSIS — M47812 Spondylosis without myelopathy or radiculopathy, cervical region: Secondary | ICD-10-CM

## 2021-04-25 DIAGNOSIS — M62830 Muscle spasm of back: Secondary | ICD-10-CM | POA: Diagnosis not present

## 2021-04-25 DIAGNOSIS — M5412 Radiculopathy, cervical region: Secondary | ICD-10-CM | POA: Diagnosis not present

## 2021-04-25 DIAGNOSIS — M9907 Segmental and somatic dysfunction of upper extremity: Secondary | ICD-10-CM | POA: Diagnosis not present

## 2021-04-25 NOTE — Patient Instructions (Addendum)
°   Will consider restarting Emgality every 28 days.  First dose consists of two injections.  Then 1 injection every 28 days thereafter.    Consider restart tizanidine at earliest onset of neck pain, take tizanidine 1/2 tablet to 1 tablet.  Caution for drowsiness,  Limit use of pain relievers to no more than 2 days out of the week.  These medications include acetaminophen, NSAIDs (ibuprofen/Advil/Motrin, naproxen/Aleve, triptans (Imitrex/sumatriptan), Excedrin, and narcotics.  This will help reduce risk of rebound headaches. Consider supplements:  magnesium citrate 400mg  daily, riboflavin 400mg  daily, coenzyme Q10 100mg  three times daily. Referral to neurosurgery Be aware of common food triggers:  - Caffeine:  coffee, black tea, cola, Mt. Dew  - Chocolate  - Dairy:  aged cheeses (brie, blue, cheddar, gouda, [place], provolone, [place], Swiss, etc), chocolate milk, buttermilk, sour cream, limit eggs and yogurt  - Nuts, peanut butter  - Alcohol  - Cereals/grains:  FRESH breads (fresh bagels, sourdough, doughnuts), yeast productions  - Processed/canned/aged/cured meats (pre-packaged deli meats, hotdogs)  - MSG/glutamate:  soy sauce, flavor enhancer, pickled/preserved/marinated foods  - Sweeteners:  aspartame (Equal, Nutrasweet).  Sugar and Splenda are okay  - Vegetables:  legumes (lima beans, lentils, snow peas, fava beans, pinto peans, peas, garbanzo beans), sauerkraut, onions, olives, pickles  - Fruit:  avocados, bananas, citrus fruit (orange, lemon, grapefruit), mango  - Other:  Frozen meals, macaroni and cheese Routine exercise Stay adequately hydrated (aim for 64 oz water daily) Keep headache diary Maintain proper stress management Maintain proper sleep hygiene Do not skip meals

## 2021-04-25 NOTE — Progress Notes (Signed)
NEUROLOGY PROGRESS  NOTE  Courtney Alvarez MRN: [id number] DOB: [birth year]  Referring provider: Meredith Pel, MD Primary care provider: Lebron Conners, MD  Reason for consult:  headache  Assessment/Plan:   1.  Migraine without aura, without status migrainosus, not intractable 2.  Cervicalgia 3.  Cervical spondylosis  1.  Migraine prevention:   Restart Emgality every 28 days, allow 4 months for optimal therapeutic results  2.  Migraine rescue: Start Nurtec ODT  calcitonin gene-related peptide (CGRP) receptor antagonists (gepants), 1 po in 24 h rescue headaches.   3.  Limit use of pain relievers to no more than 2 days out of week to prevent risk of rebound or medication-overuse headache. 4.  Keep headache diary 5.  Follow up 6 months.   Subjective:  Courtney Alvarez is a 46 year old right-handed female with hypothyroidism and chronic fatigue syndrome who presents for headache.  Patient last seen in June 2022.  Try the [facility] but she only allowed to injections, and discontinued because she felt "it was doing nothing ".  She also was given samples of Ubrelvy which she did not like, and Tizanidine 2-4mg  at onset of neck pain, did not take it.  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  She is here for further evaluation, and to entertain other therapeutic agents.  She also complains of worsening right arm numbness and tingling, intermittent throughout the day, with the rest of the extremities without any symptoms.  She is able to hold objects without dropping them.  Strength is normal.  She reports migraines as a child.  She had previous head CTs.  A neurologist at that time diagnosed her with "cluster headache".  They subsequently subsided.  She started experiencing recurrence of these headaches again in 2018.  She describes moderate to severe throbbing and aching pain starting in the shoulders and radiating up the  posterior neck to back of head wrapping to the front.  There is associated photophobia and phonophobia but no nausea, vomiting, or visual disturbance (when she was younger, migraines were accompanied by nausea).  They would last 1 to 2 days and occur about once a week.  Triggers include stress.  Massage, chiropractic medication and mobility help relieve them.  In addition, she experienced numbness and pain down the right arm.  Numbness is of migrating nature, worse in a.m ,and intermittently throughout the day. She had an MRI of the cervical spine on 12/26/2016, which demonstrated multilevel degenerative changes, more pronounced at C4-5 and C5-6 with moderate bilateral neuroforaminal stenosis.  Epidural injection at that time was ineffective.  She followed up with orthopedics, received another injection without success.  Repeat MRI of the cervical spine on 07/29/2020 showed mild multilevel spondylosis with moderate right worse than left C5 and C6 foraminal stenosis and mild bilateral C7 foraminal stenosis.   She underwent right C7-T1 epidural injection on 09/13/2020.    Current NSAIDS/analgesics:  Treats with Tylenol or Tylenol Tension 3-4 times a week Current triptans:  none Current ergotamine:  none Current anti-emetic:  Zofran-ODT 4mg  Current muscle relaxants: none Current Antihypertensive medications:  none Current Antidepressant medications:  Fluoxetine 60mg  QD, venlafaxine XR 150mg  QD Current Anticonvulsant medications:  none Current anti-CGRP:  none Current Vitamins/Herbal/Supplements:  MVI, B12, Omega-3 Mg, CoQ Current Antihistamines/Decongestants:  none Other therapy:  Chiropractor at least once a month, massage Hormone/birth control:  Mirena Other medications:  Restoril, Thyroid  Past NSAIDS/analgesics:  Diclofenac tab, tramadol Past abortive triptans:  Sumatriptan [place], Maxalt-MLT  Past calcitonin gene-related peptide receptor antagonists: Ubrelvy 100mg  at onset of headache, did not take it   Past abortive ergotamine:  none Past muscle relaxants:  Robaxin,   Tizanidine 2-4mg  at onset of neck pain, did not take it;  Past anti-emetic:  none Past antihypertensive medications:  none Past antidepressant medications:  Wellbutrin Past anticonvulsant medications:  Gabapentin (drowsiness) Past anti-CGRP:  none Past vitamins/Herbal/Supplements:  none Past antihistamines/decongestants:  none Other past therapies:  none  Caffeine:  3-4 cups coffee daily.   Diet:  Tries to drink 64oz or more daily of water.  Skips some meals.   Exercise:  routine Depression:  controlled; Anxiety:  high, new job as Air traffic controller, high stress Other pain:  Back and neck pain, R arm and R hand migrating numbness, worse in the morning  Sleep:  good Family history of headache:  Paternal grandfather  PAST MEDICAL HISTORY: Past Medical History:  Diagnosis Date   Arthritis    Chronic fatigue syndrome    GERD (gastroesophageal reflux disease)    Headache    tension/cluster   Hypothyroidism    Motion sickness    Plantar fasciitis of left foot    PONV (postoperative nausea and vomiting)    Thyroid disease    Vaginal Pap smear, abnormal     PAST SURGICAL HISTORY: Past Surgical History:  Procedure Laterality Date   BLADDER SURGERY     CERVICAL BIOPSY  W/ LOOP ELECTRODE EXCISION     COLONOSCOPY WITH PROPOFOL N/A 01/05/2016   Procedure: COLONOSCOPY WITH PROPOFOL;  Surgeon: Lucilla Lame, MD;  Location: [place];  Service: Endoscopy;  Laterality: N/A;   FOOT SURGERY Left    POLYPECTOMY  01/05/2016   Procedure: POLYPECTOMY;  Surgeon: Lucilla Lame, MD;  Location: [place];  Service: Endoscopy;;    MEDICATIONS: Current Outpatient Medications on File Prior to Visit  Medication Sig Dispense Refill   Cholecalciferol (PA VITAMIN D-3 GUMMY PO) Take 2 tablets by mouth daily.      cyanocobalamin 1000 MCG tablet Take 1,000 mcg by mouth daily.     FLUoxetine HCl 60 MG TABS Take 60 mg by mouth  every morning.      lansoprazole (PREVACID) 15 MG capsule      levonorgestrel (MIRENA) 20 MCG/24HR IUD 1 each by Intrauterine route once.     Multiple Vitamins-Minerals (MULTIVITAMIN GUMMIES WOMENS) CHEW Chew 2 tablets by mouth daily.      Omega-3 1000 MG CAPS Take by mouth.     thyroid (NP THYROID) 120 MG tablet Take 1 tablet (120 mg total) by mouth daily before breakfast. 90 tablet 3   venlafaxine XR (EFFEXOR-XR) 150 MG 24 hr capsule TK 1 C PO D  1   XIIDRA 5 % SOLN Place 1 drop into both eyes 2 (two) times daily.     No current facility-administered medications on file prior to visit.    ALLERGIES: No Known Allergies  FAMILY HISTORY: Family History  Problem Relation Age of Onset   Arthritis Mother    COPD Mother    Thyroid disease Mother    Arthritis Father    Hyperlipidemia Father    Cancer Father        polyp removed   Heart disease Father    Hashimoto's thyroiditis Sister    Arthritis Maternal Grandmother    Cancer Maternal Grandmother        colon   Heart  disease Maternal Grandmother    Thyroid disease Maternal Grandmother    Alcohol abuse Maternal Grandfather    Arthritis Maternal Grandfather    Arthritis Paternal Grandmother    Depression Paternal Grandmother    Hypertension Paternal Grandmother    Stroke Paternal Grandmother    Arthritis Paternal Grandfather    Heart disease Paternal Grandfather    Hypertension Paternal Grandfather    Breast cancer Other     Objective:  Blood pressure 116/81, pulse 80, height 5\' 6"  (1.676 m), weight 193 lb 9.6 oz (87.8 kg), SpO2 95 %. General: No acute distress.  Patient appears well-groomed.   Head:  Normocephalic/atraumatic Eyes:  fundi examined but not visualized Neck: supple, mild bilateral paraspinal tenderness, full range of motion Back: No paraspinal tenderness Heart: regular rate and rhythm Lungs: Clear to auscultation bilaterally. Vascular: No carotid bruits. Neurological Exam: Mental status: alert and  oriented to person, place, and time, recent and remote memory intact, fund of knowledge intact, attention and concentration intact, speech fluent and not dysarthric, language intact. Cranial nerves: CN I: not tested CN II: pupils equal, round and reactive to light, visual fields intact CN III, IV, VI:  full range of motion, no nystagmus, no ptosis CN V: facial sensation intact. CN VII: upper and lower face symmetric CN VIII: hearing intact CN IX, X: gag intact, uvula midline CN XI: sternocleidomastoid and trapezius muscles intact CN XII: tongue midline Bulk & Tone: normal, no fasciculations. Motor:  muscle strength 5/5 throughout Sensation:  Pinprick, temperature and vibratory sensation intact. Deep Tendon Reflexes:  2+ throughout,  toes downgoing.   Finger to nose testing:  Without dysmetria.   Heel to shin:  Without dysmetria.   Gait:  Normal station and stride.  Romberg negative.    Thank you for allowing me to take part in the care of this patient. Sharene Butters, PA-C   CC:  Lebron Conners, MD  Marcene Duos, MD

## 2021-04-26 ENCOUNTER — Encounter: Payer: Self-pay | Admitting: Physician Assistant

## 2021-04-26 ENCOUNTER — Other Ambulatory Visit: Payer: Self-pay | Admitting: Physician Assistant

## 2021-04-26 MED ORDER — NURTEC 75 MG PO TBDP: 75.0000 mg | ORAL_TABLET | Freq: Every day | ORAL | 1 refills | Status: DC | PRN

## 2021-04-26 MED ORDER — EMGALITY 120 MG/ML ~~LOC~~ SOAJ: 120.0000 mg | SUBCUTANEOUS | 5 refills | Status: DC

## 2021-04-27 ENCOUNTER — Telehealth: Payer: Self-pay

## 2021-04-27 NOTE — Telephone Encounter (Signed)
New message    The Prior Authorization request has been approved for Nurtec 75MG  OR TBDP. The authorization is valid from 03/28/2021 through 10/24/2021. A letter of explanation will also be mailed to the patient. This PA determination only applies to medications dispensed by a pharmacy and billed to the [facility] pharmacy benefit.  Courtney Alvarez (Key: [id number]) Nurtec 75MG  dispersible tablets   Form [employer] Electronic PA Form (2017 NCPDP) Created 25 minutes ago Sent to Plan 22 minutes ago Plan Response 22 minutes ago Submit Clinical Questions less than a minute ago Determination Favorable less than a minute ago Message from [place] PA request has been approved. Additional information will be provided in the approval communication. (Message 1145)

## 2021-05-01 DIAGNOSIS — M9907 Segmental and somatic dysfunction of upper extremity: Secondary | ICD-10-CM | POA: Diagnosis not present

## 2021-05-01 DIAGNOSIS — M62431 Contracture of muscle, right forearm: Secondary | ICD-10-CM | POA: Diagnosis not present

## 2021-05-01 DIAGNOSIS — M62830 Muscle spasm of back: Secondary | ICD-10-CM | POA: Diagnosis not present

## 2021-05-01 DIAGNOSIS — M9901 Segmental and somatic dysfunction of cervical region: Secondary | ICD-10-CM | POA: Diagnosis not present

## 2021-05-01 DIAGNOSIS — M9903 Segmental and somatic dysfunction of lumbar region: Secondary | ICD-10-CM | POA: Diagnosis not present

## 2021-05-01 DIAGNOSIS — M5412 Radiculopathy, cervical region: Secondary | ICD-10-CM | POA: Diagnosis not present

## 2021-05-10 DIAGNOSIS — M9901 Segmental and somatic dysfunction of cervical region: Secondary | ICD-10-CM | POA: Diagnosis not present

## 2021-05-10 DIAGNOSIS — M9907 Segmental and somatic dysfunction of upper extremity: Secondary | ICD-10-CM | POA: Diagnosis not present

## 2021-05-10 DIAGNOSIS — M5412 Radiculopathy, cervical region: Secondary | ICD-10-CM | POA: Diagnosis not present

## 2021-05-10 DIAGNOSIS — M9903 Segmental and somatic dysfunction of lumbar region: Secondary | ICD-10-CM | POA: Diagnosis not present

## 2021-05-10 DIAGNOSIS — M62431 Contracture of muscle, right forearm: Secondary | ICD-10-CM | POA: Diagnosis not present

## 2021-05-10 DIAGNOSIS — M62830 Muscle spasm of back: Secondary | ICD-10-CM | POA: Diagnosis not present

## 2021-05-14 DIAGNOSIS — F321 Major depressive disorder, single episode, moderate: Secondary | ICD-10-CM | POA: Diagnosis not present

## 2021-05-14 DIAGNOSIS — F411 Generalized anxiety disorder: Secondary | ICD-10-CM | POA: Diagnosis not present

## 2021-05-14 DIAGNOSIS — R5382 Chronic fatigue, unspecified: Secondary | ICD-10-CM | POA: Diagnosis not present

## 2021-05-14 DIAGNOSIS — F5105 Insomnia due to other mental disorder: Secondary | ICD-10-CM | POA: Diagnosis not present

## 2021-05-15 ENCOUNTER — Encounter: Payer: Self-pay | Admitting: Nurse Practitioner

## 2021-05-17 DIAGNOSIS — M5412 Radiculopathy, cervical region: Secondary | ICD-10-CM | POA: Diagnosis not present

## 2021-05-17 DIAGNOSIS — M62431 Contracture of muscle, right forearm: Secondary | ICD-10-CM | POA: Diagnosis not present

## 2021-05-17 DIAGNOSIS — M9907 Segmental and somatic dysfunction of upper extremity: Secondary | ICD-10-CM | POA: Diagnosis not present

## 2021-05-17 DIAGNOSIS — M9903 Segmental and somatic dysfunction of lumbar region: Secondary | ICD-10-CM | POA: Diagnosis not present

## 2021-05-17 DIAGNOSIS — M9901 Segmental and somatic dysfunction of cervical region: Secondary | ICD-10-CM | POA: Diagnosis not present

## 2021-05-17 DIAGNOSIS — M62830 Muscle spasm of back: Secondary | ICD-10-CM | POA: Diagnosis not present

## 2021-05-20 DIAGNOSIS — J988 Other specified respiratory disorders: Secondary | ICD-10-CM | POA: Diagnosis not present

## 2021-05-20 DIAGNOSIS — R059 Cough, unspecified: Secondary | ICD-10-CM | POA: Diagnosis not present

## 2021-05-20 DIAGNOSIS — Z20822 Contact with and (suspected) exposure to covid-19: Secondary | ICD-10-CM | POA: Diagnosis not present

## 2021-05-22 ENCOUNTER — Telehealth: Payer: Federal, State, Local not specified - PPO | Admitting: Emergency Medicine

## 2021-05-22 DIAGNOSIS — R0989 Other specified symptoms and signs involving the circulatory and respiratory systems: Secondary | ICD-10-CM

## 2021-05-22 DIAGNOSIS — R0981 Nasal congestion: Secondary | ICD-10-CM | POA: Diagnosis not present

## 2021-05-22 MED ORDER — AMOXICILLIN-POT CLAVULANATE 875-125 MG PO TABS: 1.0000 | ORAL_TABLET | Freq: Two times a day (BID) | ORAL | 0 refills | Status: DC

## 2021-05-22 NOTE — Patient Instructions (Signed)
October Candyce Churn, thank you for joining Carvel Getting, NP for today's virtual visit.  While this provider is not your primary care provider (PCP), if your PCP is located in our provider database this encounter information will be shared with them immediately following your visit.  Consent: (Patient) Courtney Alvarez provided verbal consent for this virtual visit at the beginning of the encounter.  Current Medications:  Current Outpatient Medications:    amoxicillin-clavulanate (AUGMENTIN) 875-125 MG tablet, Take 1 tablet by mouth 2 (two) times daily., Disp: 14 tablet, Rfl: 0   amphetamine-dextroamphetamine (ADDERALL XR) 20 MG 24 hr capsule, Take 20 mg by mouth daily., Disp: , Rfl:    Cholecalciferol (PA VITAMIN D-3 GUMMY PO), Take 2 tablets by mouth daily. , Disp: , Rfl:    cyanocobalamin 1000 MCG tablet, Take 1,000 mcg by mouth daily., Disp: , Rfl:    FLUoxetine HCl 60 MG TABS, Take 60 mg by mouth every morning. , Disp: , Rfl:    Galcanezumab-gnlm (EMGALITY) 120 MG/ML SOAJ, Inject 120 mg into the skin every 30 (thirty) days., Disp: 1.12 mL, Rfl: 5   lansoprazole (PREVACID) 15 MG capsule, , Disp: , Rfl:    levonorgestrel (MIRENA) 20 MCG/24HR IUD, 1 each by Intrauterine route once., Disp: , Rfl:    Multiple Vitamins-Minerals (MULTIVITAMIN GUMMIES WOMENS) CHEW, Chew 2 tablets by mouth daily. , Disp: , Rfl:    Omega-3 1000 MG CAPS, Take by mouth., Disp: , Rfl:    Rimegepant Sulfate (NURTEC) 75 MG TBDP, Take 75 mg by mouth daily as needed (1 po in 24 h rescue headaches)., Disp: 30 tablet, Rfl: 1   thyroid (NP THYROID) 120 MG tablet, Take 1 tablet (120 mg total) by mouth daily before breakfast., Disp: 90 tablet, Rfl: 3   venlafaxine XR (EFFEXOR-XR) 150 MG 24 hr capsule, TK 1 C PO D, Disp: , Rfl: 1   XIIDRA 5 % SOLN, Place 1 drop into both eyes 2 (two) times daily., Disp: , Rfl:    Medications ordered in this encounter:  Meds ordered this encounter  Medications   amoxicillin-clavulanate (AUGMENTIN)  875-125 MG tablet    Sig: Take 1 tablet by mouth 2 (two) times daily.    Dispense:  14 tablet    Refill:  0     *If you need refills on other medications prior to your next appointment, please contact your pharmacy*  Follow-Up: Call back or seek an in-person evaluation if the symptoms worsen or if the condition fails to improve as anticipated.  Other Instructions Your viral illness, likely influenza, will have to run its course.  Continue using the Tessalon Perles, ipratropium nasal spray, Mucinex, and Sudafed.  Make sure you are drinking plenty of liquids to stay hydrated.  Anything you can do to help your nasal congestion drain from your nose as opposed to saying stagnant in your sinuses or ears will help prevent a bacterial infection.  I strongly recommend using saline nasal spray or saline irrigation several times a day to help facilitate drainage of nasal congestion.  Other things that can help nasal congestion include steamy showers, Vicks vapor rub, and hot herbal tea such as chamomile.  If your nasal congestion continues to thicken and darken, and if it changes to a green color, go ahead and get the prescription for Augmentin filled.  Otherwise, if you are able to manage your congestion and prevent this, you will not need the antibiotic.   If you have been instructed to have an  in-person evaluation today at a local Urgent Care facility, please use the link below. It will take you to a list of all of our available [facility], including address, phone number and hours of operation. Please do not delay care.  [facility]  If you or a family member do not have a primary care provider, use the link below to schedule a visit and establish care. When you choose a [facility] primary care physician or advanced practice provider, you gain a long-term partner in health. Find a Primary Care Provider  Learn more about [facility]'s in-office and virtual care  options: [facility]

## 2021-05-22 NOTE — Progress Notes (Signed)
Virtual Visit Consent   Courtney Alvarez, you are scheduled for a virtual visit with a [facility] provider today.     Just as with appointments in the office, your consent must be obtained to participate.  Your consent will be active for this visit and any virtual visit you may have with one of our providers in the next 365 days.     If you have a MyChart account, a copy of this consent can be sent to you electronically.  All virtual visits are billed to your insurance company just like a traditional visit in the office.    As this is a virtual visit, video technology does not allow for your provider to perform a traditional examination.  This may limit your provider's ability to fully assess your condition.  If your provider identifies any concerns that need to be evaluated in person or the need to arrange testing (such as labs, EKG, etc.), we will make arrangements to do so.     Although advances in technology are sophisticated, we cannot ensure that it will always work on either your end or our end.  If the connection with a video visit is poor, the visit may have to be switched to a telephone visit.  With either a video or telephone visit, we are not always able to ensure that we have a secure connection.     I need to obtain your verbal consent now.   Are you willing to proceed with your visit today?    Courtney Alvarez has provided verbal consent on 05/22/2021 for a virtual visit (video or telephone).   Carvel Getting, NP   Date: 05/22/2021 8:55 AM   Virtual Visit via Video Note   I, Carvel Getting, connected with  Courtney Alvarez  ([id number], Jul 27, 1975) on 05/22/21 at  8:45 AM EST by a video-enabled telemedicine application and verified that I am speaking with the correct person using two identifiers.  Location: Patient: Virtual Visit Location Patient: Home Provider: Virtual Visit Location Provider: Home Office   I discussed the limitations of evaluation and management by telemedicine and  the availability of in person appointments. The patient expressed understanding and agreed to proceed.    History of Present Illness: Courtney Alvarez is a 46 y.o. who identifies as a female who was assigned female at birth, and is being seen today for concerns for a sinus infection.  Patient's daughter was sick all last week and diagnosed with influenza this past weekend.  Patient began feeling ill with similar symptoms on 05/17/2021.  She feels she is getting progressively worse.  She assumes she has influenza like her daughter.  She reports cough, nasal congestion, chills, myalgias.  She denies fever.  She did get an influenza vaccine this year for work.  She reports postnasal drainage, yellow sputum, yellow nasal congestion.  She reports she feels her ears are congested/feel a lot of pressure and her sinuses feel congested and pressure.  She wonders if she has a sinus infection on top of the viral infection.  She denies shortness of breath or wheezing.  She has been using Tessalon Perles, ipratropium nasal spray, Mucinex, and Sudafed to manage her symptoms.  HPI: HPI  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   Nontoxic uninodular goiter 01/30/2021   Observation for  suspected condition 01/30/2021   Other personal history presenting hazards to health 01/30/2021   Personal history of return from [employer] deployment 01/30/2021   Screening for malignant neoplasm of cervix 01/30/2021   Sinusitis 01/30/2021   Vitamin D deficiency 01/02/2021   Other hypersomnia 12/12/2020   IUD (intrauterine device) in place 05/11/2020   Anxiety with depression 09/09/2019   Gastroesophageal reflux disease without esophagitis 08/26/2018   Moderate episode of recurrent major depressive disorder ([place]) 08/26/2018   GAD (generalized anxiety disorder) 08/26/2018   Dry eyes  08/26/2018   Immune to varicella 06/01/2017   Family history of colonic polyps    Benign neoplasm of sigmoid colon    Family history of colon cancer 07/31/2015   Obesity, morbid ([place]) 07/31/2015   Loss of feeling or sensation 06/22/2015   Chronic pain in left foot 06/22/2015   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HCC) 12/16/2013   Arthralgia of multiple joints 12/16/2013   Goiter, nontoxic, 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   Adult hypothyroidism 09/07/2013    Allergies: No Known Allergies Medications:  Current Outpatient Medications:    amoxicillin-clavulanate (AUGMENTIN) 875-125 MG tablet, Take 1 tablet by mouth 2 (two) times daily., Disp: 14 tablet, Rfl: 0   amphetamine-dextroamphetamine (ADDERALL XR) 20 MG 24 hr capsule, Take 20 mg by mouth daily., Disp: , Rfl:    Cholecalciferol (PA VITAMIN D-3 GUMMY PO), Take 2 tablets by mouth daily. , Disp: , Rfl:    cyanocobalamin 1000 MCG tablet, Take 1,000 mcg by mouth daily., Disp: , Rfl:    FLUoxetine HCl 60 MG TABS, Take 60 mg by mouth every morning. , Disp: , Rfl:    Galcanezumab-gnlm (EMGALITY) 120 MG/ML SOAJ, Inject 120 mg into the skin every 30 (thirty) days., Disp: 1.12 mL, Rfl: 5   lansoprazole (PREVACID) 15 MG capsule, , Disp: , Rfl:    levonorgestrel (MIRENA) 20 MCG/24HR IUD, 1 each by Intrauterine route once., Disp: , Rfl:    Multiple Vitamins-Minerals (MULTIVITAMIN GUMMIES WOMENS) CHEW, Chew 2 tablets by mouth daily. , Disp: , Rfl:    Omega-3 1000 MG CAPS, Take by mouth., Disp: , Rfl:    Rimegepant Sulfate (NURTEC) 75 MG TBDP, Take 75 mg by mouth daily as needed (1 po in 24 h rescue headaches)., Disp: 30 tablet, Rfl: 1   thyroid (NP THYROID) 120 MG tablet, Take 1 tablet (120 mg total) by mouth daily before breakfast., Disp: 90 tablet, Rfl: 3   venlafaxine XR (EFFEXOR-XR) 150 MG 24 hr capsule, TK 1 C PO D, Disp: , Rfl: 1   XIIDRA 5 % SOLN, Place 1 drop into both eyes 2 (two) times daily., Disp: , Rfl:    Observations/Objective: Patient is well-developed, well-nourished in no acute distress.  Resting comfortably  at home.  Head is normocephalic, atraumatic.  No labored breathing.  Speech is clear and coherent with logical content.  Patient is alert and oriented at baseline.  Patient sounds congested  Assessment and Plan: 1. Suspected novel influenza A virus infection  2. Nasal congestion  Discussed with patient that she likely does not have a bacterial sinus infection at this time, however she is likely headed for a bacterial sinus infection if she does not start aggressively managing her nasal congestion.  We discussed the use of saline nasal spray and any other mechanisms to promote drainage of congestion through her nose.  Patient will monitor her symptoms and work to aggressively manage her nasal congestion to try to prevent acute bacterial sinus infection.  We  agreed to take a watchful waiting approach to her sinus symptoms.  If her symptoms continue to worsen or fail to improve over the next couple of days, I have prescribed Augmentin for her to take for possible bacterial sinus infection.  Patient declines offer a work note as she is off this week.    Follow Up Instructions: I discussed the assessment and treatment plan with the patient. The patient was provided an opportunity to ask questions and all were answered. The patient agreed with the plan and demonstrated an understanding of the instructions.  A copy of instructions were sent to the patient via MyChart unless otherwise noted below.   The patient was advised to call back or seek an in-person evaluation if the symptoms worsen or if the condition fails to improve as anticipated.  Time:  I spent 12 minutes with the patient via telehealth technology discussing the above problems/concerns.    Carvel Getting, NP

## 2021-05-24 DIAGNOSIS — M9907 Segmental and somatic dysfunction of upper extremity: Secondary | ICD-10-CM | POA: Diagnosis not present

## 2021-05-24 DIAGNOSIS — M62830 Muscle spasm of back: Secondary | ICD-10-CM | POA: Diagnosis not present

## 2021-05-24 DIAGNOSIS — M9903 Segmental and somatic dysfunction of lumbar region: Secondary | ICD-10-CM | POA: Diagnosis not present

## 2021-05-24 DIAGNOSIS — M62431 Contracture of muscle, right forearm: Secondary | ICD-10-CM | POA: Diagnosis not present

## 2021-05-24 DIAGNOSIS — M5412 Radiculopathy, cervical region: Secondary | ICD-10-CM | POA: Diagnosis not present

## 2021-05-24 DIAGNOSIS — M9901 Segmental and somatic dysfunction of cervical region: Secondary | ICD-10-CM | POA: Diagnosis not present

## 2021-05-25 DIAGNOSIS — F411 Generalized anxiety disorder: Secondary | ICD-10-CM | POA: Diagnosis not present

## 2021-05-25 DIAGNOSIS — F331 Major depressive disorder, recurrent, moderate: Secondary | ICD-10-CM | POA: Diagnosis not present

## 2021-05-29 ENCOUNTER — Telehealth: Payer: Federal, State, Local not specified - PPO | Admitting: Physician Assistant

## 2021-05-29 DIAGNOSIS — J019 Acute sinusitis, unspecified: Secondary | ICD-10-CM

## 2021-05-29 DIAGNOSIS — B9689 Other specified bacterial agents as the cause of diseases classified elsewhere: Secondary | ICD-10-CM

## 2021-05-29 MED ORDER — DOXYCYCLINE MONOHYDRATE 100 MG PO CAPS: 100.0000 mg | ORAL_CAPSULE | Freq: Two times a day (BID) | ORAL | 0 refills | Status: DC

## 2021-05-29 MED ORDER — PREDNISONE 20 MG PO TABS
40.0000 mg | ORAL_TABLET | Freq: Every day | ORAL | 0 refills | Status: DC
Start: 2021-05-29 — End: 2021-08-20

## 2021-05-29 NOTE — Patient Instructions (Signed)
Norie D Heinke, thank you for joining Leeanne Rio, PA-C for today's virtual visit.  While this provider is not your primary care provider (PCP), if your PCP is located in our provider database this encounter information will be shared with them immediately following your visit.  Consent: (Patient) Courtney Alvarez provided verbal consent for this virtual visit at the beginning of the encounter.  Current Medications:  Current Outpatient Medications:    amoxicillin-clavulanate (AUGMENTIN) 875-125 MG tablet, Take 1 tablet by mouth 2 (two) times daily., Disp: 14 tablet, Rfl: 0   amphetamine-dextroamphetamine (ADDERALL XR) 20 MG 24 hr capsule, Take 20 mg by mouth daily., Disp: , Rfl:    Cholecalciferol (PA VITAMIN D-3 GUMMY PO), Take 2 tablets by mouth daily. , Disp: , Rfl:    cyanocobalamin 1000 MCG tablet, Take 1,000 mcg by mouth daily., Disp: , Rfl:    FLUoxetine HCl 60 MG TABS, Take 60 mg by mouth every morning. , Disp: , Rfl:    Galcanezumab-gnlm (EMGALITY) 120 MG/ML SOAJ, Inject 120 mg into the skin every 30 (thirty) days., Disp: 1.12 mL, Rfl: 5   lansoprazole (PREVACID) 15 MG capsule, , Disp: , Rfl:    levonorgestrel (MIRENA) 20 MCG/24HR IUD, 1 each by Intrauterine route once., Disp: , Rfl:    Multiple Vitamins-Minerals (MULTIVITAMIN GUMMIES WOMENS) CHEW, Chew 2 tablets by mouth daily. , Disp: , Rfl:    Omega-3 1000 MG CAPS, Take by mouth., Disp: , Rfl:    Rimegepant Sulfate (NURTEC) 75 MG TBDP, Take 75 mg by mouth daily as needed (1 po in 24 h rescue headaches)., Disp: 30 tablet, Rfl: 1   thyroid (NP THYROID) 120 MG tablet, Take 1 tablet (120 mg total) by mouth daily before breakfast., Disp: 90 tablet, Rfl: 3   venlafaxine XR (EFFEXOR-XR) 150 MG 24 hr capsule, TK 1 C PO D, Disp: , Rfl: 1   XIIDRA 5 % SOLN, Place 1 drop into both eyes 2 (two) times daily., Disp: , Rfl:    Medications ordered in this encounter:  No orders of the defined types were placed in this encounter.    *If  you need refills on other medications prior to your next appointment, please contact your pharmacy*  Follow-Up: Call back or seek an in-person evaluation if the symptoms worsen or if the condition fails to improve as anticipated.  Other Instructions Stop the Augmentin. Start the Doxycycline, taking as directed. Increase fluid intake.  Use Saline nasal spray. Restart your Ipratropium spray. Continue Mucinex OTC. Take a daily multivitamin. Take the prednisone as directed.  Place a humidifier in the bedroom.  If symptoms are not resolving or you note new or worsening symptoms despite treatment, please be evaluated in person ASAP. Do not delay care!  Sinusitis Sinusitis is redness, soreness, and swelling (inflammation) of the paranasal sinuses. Paranasal sinuses are air pockets within the bones of your face (beneath the eyes, the middle of the forehead, or above the eyes). In healthy paranasal sinuses, mucus is able to drain out, and air is able to circulate through them by way of your nose. However, when your paranasal sinuses are inflamed, mucus and air can become trapped. This can allow bacteria and other germs to grow and cause infection. Sinusitis can develop quickly and last only a short time (acute) or continue over a long period (chronic). Sinusitis that lasts for more than 12 weeks is considered chronic.  CAUSES  Causes of sinusitis include: Allergies. Structural abnormalities, such as displacement of the  cartilage that separates your nostrils (deviated septum), which can decrease the air flow through your nose and sinuses and affect sinus drainage. Functional abnormalities, such as when the small hairs (cilia) that line your sinuses and help remove mucus do not work properly or are not present. SYMPTOMS  Symptoms of acute and chronic sinusitis are the same. The primary symptoms are pain and pressure around the affected sinuses. Other symptoms include: Upper  toothache. Earache. Headache. Bad breath. Decreased sense of smell and taste. A cough, which worsens when you are lying flat. Fatigue. Fever. Thick drainage from your nose, which often is green and may contain pus (purulent). Swelling and warmth over the affected sinuses. DIAGNOSIS  Your caregiver will perform a physical exam. During the exam, your caregiver may: Look in your nose for signs of abnormal growths in your nostrils (nasal polyps). Tap over the affected sinus to check for signs of infection. View the inside of your sinuses (endoscopy) with a special imaging device with a light attached (endoscope), which is inserted into your sinuses. If your caregiver suspects that you have chronic sinusitis, one or more of the following tests may be recommended: Allergy tests. Nasal culture A sample of mucus is taken from your nose and sent to a lab and screened for bacteria. Nasal cytology A sample of mucus is taken from your nose and examined by your caregiver to determine if your sinusitis is related to an allergy. TREATMENT  Most cases of acute sinusitis are related to a viral infection and will resolve on their own within 10 days. Sometimes medicines are prescribed to help relieve symptoms (pain medicine, decongestants, nasal steroid sprays, or saline sprays).  However, for sinusitis related to a bacterial infection, your caregiver will prescribe antibiotic medicines. These are medicines that will help kill the bacteria causing the infection.  Rarely, sinusitis is caused by a fungal infection. In theses cases, your caregiver will prescribe antifungal medicine. For some cases of chronic sinusitis, surgery is needed. Generally, these are cases in which sinusitis recurs more than 3 times per year, despite other treatments. HOME CARE INSTRUCTIONS  Drink plenty of water. Water helps thin the mucus so your sinuses can drain more easily. Use a humidifier. Inhale steam 3 to 4 times a day (for  example, sit in the bathroom with the shower running). Apply a warm, moist washcloth to your face 3 to 4 times a day, or as directed by your caregiver. Use saline nasal sprays to help moisten and clean your sinuses. Take over-the-counter or prescription medicines for pain, discomfort, or fever only as directed by your caregiver. SEEK IMMEDIATE MEDICAL CARE IF: You have increasing pain or severe headaches. You have nausea, vomiting, or drowsiness. You have swelling around your face. You have vision problems. You have a stiff neck. You have difficulty breathing. MAKE SURE YOU:  Understand these instructions. Will watch your condition. Will get help right away if you are not doing well or get worse. Document Released: 04/01/2005 Document Revised: 06/24/2011 Document Reviewed: 04/16/2011 [facility] Patient Information 2014 [place], [place].    If you have been instructed to have an in-person evaluation today at a local Urgent Care facility, please use the link below. It will take you to a list of all of our available [facility], including address, phone number and hours of operation. Please do not delay care.   Urgent Cares  If you or a family member do not have a primary care provider, use the link below  to schedule a visit and establish care. When you choose a [facility] primary care physician or advanced practice provider, you gain a long-term partner in health. Find a Primary Care Provider  Learn more about [facility]'s in-office and virtual care options: [facility]

## 2021-05-29 NOTE — Progress Notes (Signed)
Virtual Visit Consent   Courtney Alvarez, you are scheduled for a virtual visit with a [facility] provider today.     Just as with appointments in the office, your consent must be obtained to participate.  Your consent will be active for this visit and any virtual visit you may have with one of our providers in the next 365 days.     If you have a MyChart account, a copy of this consent can be sent to you electronically.  All virtual visits are billed to your insurance company just like a traditional visit in the office.    As this is a virtual visit, video technology does not allow for your provider to perform a traditional examination.  This may limit your provider's ability to fully assess your condition.  If your provider identifies any concerns that need to be evaluated in person or the need to arrange testing (such as labs, EKG, etc.), we will make arrangements to do so.     Although advances in technology are sophisticated, we cannot ensure that it will always work on either your end or our end.  If the connection with a video visit is poor, the visit may have to be switched to a telephone visit.  With either a video or telephone visit, we are not always able to ensure that we have a secure connection.     I need to obtain your verbal consent now.   Are you willing to proceed with your visit today?    Courtney Alvarez has provided verbal consent on 05/29/2021 for a virtual visit (video or telephone).   Leeanne Rio, [place]   Date: 05/29/2021 9:23 AM   Virtual Visit via Video Note   I, Leeanne Rio, connected with  Courtney Alvarez  ([id number], [birth year]) on 05/29/21 at  9:15 AM EST by a video-enabled telemedicine application and verified that I am speaking with the correct person using two identifiers.  Location: Patient: Virtual Visit Location Patient: Home Provider: Virtual Visit Location Provider: Home Office   I discussed the limitations of evaluation and management by  telemedicine and the availability of in person appointments. The patient expressed understanding and agreed to proceed.    History of Present Illness: Courtney Alvarez is a 46 y.o. who identifies as a female who was assigned female at birth, and is being seen today for continued sinus symptoms. Was initially evaluated via video visit on 2/7 after concerns of developing sinus infection after recent flu-like illness (daughter + for flu B). She notes following instructions given without improvement in symptoms. As such, she did start the Augmentin with some initial improvement but now noting some recurring/worsening symptoms.  Some upper tooth pain and bilateral ear pain despite antibiotic. Denies fever, significant chest congestion or chest pain. Cough is productive of yellow phlegm. Is still taking Mucinex and Sudafed OTC.  HPI: HPI  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   Nontoxic uninodular goiter 01/30/2021   Observation for suspected condition 01/30/2021   Other personal history presenting hazards to health 01/30/2021   Personal history of return from [employer] deployment 01/30/2021   Screening for malignant neoplasm of cervix 01/30/2021   Sinusitis 01/30/2021   Vitamin D deficiency 01/02/2021   Other hypersomnia 12/12/2020   IUD (intrauterine device) in place 05/11/2020  Anxiety with depression 09/09/2019   Gastroesophageal reflux disease without esophagitis 08/26/2018   Moderate episode of recurrent major depressive disorder ([place]) 08/26/2018   GAD (generalized anxiety disorder) 08/26/2018   Dry eyes 08/26/2018   Immune to varicella 06/01/2017   Family history of colonic polyps    Benign neoplasm of sigmoid colon    Family history of colon cancer 07/31/2015   Obesity, morbid ([place]) 07/31/2015   Loss of feeling or sensation 06/22/2015    Chronic pain in left foot 06/22/2015   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[place]) 12/16/2013   Arthralgia of multiple joints 12/16/2013   Goiter, nontoxic, 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   Adult hypothyroidism 09/07/2013    Allergies: No Known Allergies Medications:  Current Outpatient Medications:    amphetamine-dextroamphetamine (ADDERALL XR) 20 MG 24 hr capsule, Take 20 mg by mouth daily., Disp: , Rfl:    Cholecalciferol (PA VITAMIN D-3 GUMMY PO), Take 2 tablets by mouth daily. , Disp: , Rfl:    cyanocobalamin 1000 MCG tablet, Take 1,000 mcg by mouth daily., Disp: , Rfl:    doxycycline (MONODOX) 100 MG capsule, Take 1 capsule (100 mg total) by mouth 2 (two) times daily., Disp: 14 capsule, Rfl: 0   FLUoxetine HCl 60 MG TABS, Take 60 mg by mouth every morning. , Disp: , Rfl:    Galcanezumab-gnlm (EMGALITY) 120 MG/ML SOAJ, Inject 120 mg into the skin every 30 (thirty) days., Disp: 1.12 mL, Rfl: 5   lansoprazole (PREVACID) 15 MG capsule, , Disp: , Rfl:    levonorgestrel (MIRENA) 20 MCG/24HR IUD, 1 each by Intrauterine route once., Disp: , Rfl:    Multiple Vitamins-Minerals (MULTIVITAMIN GUMMIES WOMENS) CHEW, Chew 2 tablets by mouth daily. , Disp: , Rfl:    Omega-3 1000 MG CAPS, Take by mouth., Disp: , Rfl:    predniSONE (DELTASONE) 20 MG tablet, Take 2 tablets (40 mg total) by mouth daily with breakfast., Disp: 10 tablet, Rfl: 0   Rimegepant Sulfate (NURTEC) 75 MG TBDP, Take 75 mg by mouth daily as needed (1 po in 24 h rescue headaches)., Disp: 30 tablet, Rfl: 1   thyroid (NP THYROID) 120 MG tablet, Take 1 tablet (120 mg total) by mouth daily before breakfast., Disp: 90 tablet, Rfl: 3   venlafaxine XR (EFFEXOR-XR) 150 MG 24 hr capsule, TK 1 C PO D, Disp: , Rfl: 1   XIIDRA 5 % SOLN, Place 1 drop into both eyes 2 (two) times daily., Disp: , Rfl:   Observations/Objective: Patient is well-developed, well-nourished in no acute distress.  Resting comfortably at home.  Head is  normocephalic, atraumatic.  No labored breathing. Speech is clear and coherent with logical content.  Patient is alert and oriented at baseline.   Assessment and Plan: 1. Acute bacterial sinusitis - doxycycline (MONODOX) 100 MG capsule; Take 1 capsule (100 mg total) by mouth 2 (two) times daily.  Dispense: 14 capsule; Refill: 0 - predniSONE (DELTASONE) 20 MG tablet; Take 2 tablets (40 mg total) by mouth daily with breakfast.  Dispense: 10 tablet; Refill: 0  Rx Doxycycline in place of Augmentin.  Increase fluids.  Rest.  Saline nasal spray.  Probiotic.  Mucinex as directed.  Humidifier in bedroom. Restart Atrovent nasal spray. Prednisone burst as directed.  Call or return to clinic if symptoms are not improving.   Follow Up Instructions: I discussed the assessment and treatment plan with the patient. The patient was provided an opportunity to ask questions and all were answered. The patient agreed with the  plan and demonstrated an understanding of the instructions.  A copy of instructions were sent to the patient via MyChart unless otherwise noted below.   The patient was advised to call back or seek an in-person evaluation if the symptoms worsen or if the condition fails to improve as anticipated.  Time:  I spent 12 minutes with the patient via telehealth technology discussing the above problems/concerns.    Leeanne Rio, PA-C

## 2021-05-31 ENCOUNTER — Encounter: Payer: Self-pay | Admitting: Nurse Practitioner

## 2021-05-31 ENCOUNTER — Other Ambulatory Visit: Payer: Self-pay

## 2021-06-01 DIAGNOSIS — M5412 Radiculopathy, cervical region: Secondary | ICD-10-CM | POA: Diagnosis not present

## 2021-06-12 ENCOUNTER — Telehealth: Payer: Federal, State, Local not specified - PPO | Admitting: Family Medicine

## 2021-06-12 ENCOUNTER — Ambulatory Visit: Payer: Self-pay | Admitting: *Deleted

## 2021-06-12 DIAGNOSIS — R300 Dysuria: Secondary | ICD-10-CM | POA: Diagnosis not present

## 2021-06-12 MED ORDER — PHENAZOPYRIDINE HCL 200 MG PO TABS: 200.0000 mg | ORAL_TABLET | Freq: Three times a day (TID) | ORAL | 0 refills | Status: AC | PRN

## 2021-06-12 MED ORDER — NITROFURANTOIN MONOHYD MACRO 100 MG PO CAPS: 100.0000 mg | ORAL_CAPSULE | Freq: Two times a day (BID) | ORAL | 0 refills | Status: AC

## 2021-06-12 NOTE — Progress Notes (Signed)
Virtual Visit Consent   Courtney Alvarez, you are scheduled for a virtual visit with a [facility] provider today.     Just as with appointments in the office, your consent must be obtained to participate.  Your consent will be active for this visit and any virtual visit you may have with one of our providers in the next 365 days.     If you have a MyChart account, a copy of this consent can be sent to you electronically.  All virtual visits are billed to your insurance company just like a traditional visit in the office.    As this is a virtual visit, video technology does not allow for your provider to perform a traditional examination.  This may limit your provider's ability to fully assess your condition.  If your provider identifies any concerns that need to be evaluated in person or the need to arrange testing (such as labs, EKG, etc.), we will make arrangements to do so.     Although advances in technology are sophisticated, we cannot ensure that it will always work on either your end or our end.  If the connection with a video visit is poor, the visit may have to be switched to a telephone visit.  With either a video or telephone visit, we are not always able to ensure that we have a secure connection.     I need to obtain your verbal consent now.   Are you willing to proceed with your visit today?    Courtney Alvarez has provided verbal consent on 06/12/2021 for a virtual visit (video or telephone).   Perlie Mayo, NP   Date: 06/12/2021 1:12 PM   Virtual Visit via Video Note   I, Perlie Mayo, connected with  Courtney Alvarez  ([id number], December 30, 1975) on 06/12/21 at  1:15 PM EST by a video-enabled telemedicine application and verified that I am speaking with the correct person using two identifiers.  Location: Patient: Virtual Visit Location Patient: Home Provider: Virtual Visit Location Provider: Home Office   I discussed the limitations of evaluation and management by telemedicine  and the availability of in person appointments. The patient expressed understanding and agreed to proceed.    History of Present Illness: Courtney Alvarez is a 46 y.o. who identifies as a female who was assigned female at birth, and is being seen today for UTI symptoms.  HPI: Urinary Tract Infection  This is a new problem. The current episode started yesterday. The problem occurs every urination. The problem has been gradually worsening. The quality of the pain is described as aching and burning. There has been no fever. She is Sexually active. There is A history of pyelonephritis. Associated symptoms include frequency, hesitancy and urgency. Pertinent negatives include no chills, discharge, flank pain, hematuria, nausea, possible pregnancy, sweats or vomiting. She has tried increased fluids for the symptoms. The treatment provided mild relief. Her past medical history is significant for a urological procedure.   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   Nontoxic uninodular goiter 01/30/2021   Observation for suspected condition 01/30/2021   Other personal history presenting hazards to health 01/30/2021   Personal history of return from [employer] deployment 01/30/2021   Screening for malignant neoplasm of cervix 01/30/2021   Sinusitis 01/30/2021   Vitamin D deficiency 01/02/2021  Other hypersomnia 12/12/2020   IUD (intrauterine device) in place 05/11/2020   Anxiety with depression 09/09/2019   Gastroesophageal reflux disease without esophagitis 08/26/2018   Moderate episode of recurrent major depressive disorder ([place]) 08/26/2018   GAD (generalized anxiety disorder) 08/26/2018   Dry eyes 08/26/2018   Immune to varicella 06/01/2017   Family history of colonic polyps    Benign neoplasm of sigmoid colon    Family history of colon cancer 07/31/2015    Obesity, morbid ([place]) 07/31/2015   Loss of feeling or sensation 06/22/2015   Chronic pain in left foot 06/22/2015   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[place]) 12/16/2013   Arthralgia of multiple joints 12/16/2013   Goiter, nontoxic, 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   Adult hypothyroidism 09/07/2013    Allergies: No Known Allergies Medications:  Current Outpatient Medications:    amphetamine-dextroamphetamine (ADDERALL XR) 20 MG 24 hr capsule, Take 20 mg by mouth daily., Disp: , Rfl:    Cholecalciferol (PA VITAMIN D-3 GUMMY PO), Take 2 tablets by mouth daily. , Disp: , Rfl:    cyanocobalamin 1000 MCG tablet, Take 1,000 mcg by mouth daily., Disp: , Rfl:    doxycycline (MONODOX) 100 MG capsule, Take 1 capsule (100 mg total) by mouth 2 (two) times daily., Disp: 14 capsule, Rfl: 0   FLUoxetine HCl 60 MG TABS, Take 60 mg by mouth every morning. , Disp: , Rfl:    Galcanezumab-gnlm (EMGALITY) 120 MG/ML SOAJ, Inject 120 mg into the skin every 30 (thirty) days., Disp: 1.12 mL, Rfl: 5   lansoprazole (PREVACID) 15 MG capsule, , Disp: , Rfl:    levonorgestrel (MIRENA) 20 MCG/24HR IUD, 1 each by Intrauterine route once., Disp: , Rfl:    Multiple Vitamins-Minerals (MULTIVITAMIN GUMMIES WOMENS) CHEW, Chew 2 tablets by mouth daily. , Disp: , Rfl:    Omega-3 1000 MG CAPS, Take by mouth., Disp: , Rfl:    predniSONE (DELTASONE) 20 MG tablet, Take 2 tablets (40 mg total) by mouth daily with breakfast., Disp: 10 tablet, Rfl: 0   Rimegepant Sulfate (NURTEC) 75 MG TBDP, Take 75 mg by mouth daily as needed (1 po in 24 h rescue headaches)., Disp: 30 tablet, Rfl: 1   thyroid (NP THYROID) 120 MG tablet, Take 1 tablet (120 mg total) by mouth daily before breakfast., Disp: 90 tablet, Rfl: 3   venlafaxine XR (EFFEXOR-XR) 150 MG 24 hr capsule, TK 1 C PO D, Disp: , Rfl: 1   XIIDRA 5 % SOLN, Place 1 drop into both eyes 2 (two) times daily., Disp: , Rfl:   Observations/Objective: Patient is well-developed,  well-nourished in no acute distress.  Resting comfortably  at home.  Head is normocephalic, atraumatic.  No labored breathing.  Speech is clear and coherent with logical content.  Patient is alert and oriented at baseline.    Assessment and Plan:  1. Dysuria  S&S consistent w UTI F/u at PCP office tomorrow for sample drop off Start Macrobid for now and phenazo for spasms  - nitrofurantoin, macrocrystal-monohydrate, (MACROBID) 100 MG capsule; Take 1 capsule (100 mg total) by mouth 2 (two) times daily for 5 days.  Dispense: 10 capsule; Refill: 0 - phenazopyridine (PHENAZO) 200 MG tablet; Take 1 tablet (200 mg total) by mouth 3 (three) times daily as needed for up to 2 days for pain.  Dispense: 6 tablet; Refill: 0   Reviewed side effects, risks and benefits of medication.    Patient acknowledged agreement and understanding of the plan.    I discussed  the assessment and treatment plan with the patient. The patient was provided an opportunity to ask questions and all were answered. The patient agreed with the plan and demonstrated an understanding of the instructions.   The patient was advised to call back or seek an in-person evaluation if the symptoms worsen or if the condition fails to improve as anticipated.   The above assessment and management plan was discussed with the patient. The patient verbalized understanding of and has agreed to the management plan. Patient is aware to call the clinic if symptoms persist or worsen. Patient is aware when to return to the clinic for a follow-up visit. Patient educated on when it is appropriate to go to the emergency department.    Follow Up Instructions: I discussed the assessment and treatment plan with the patient. The patient was provided an opportunity to ask questions and all were answered. The patient agreed with the plan and demonstrated an understanding of the instructions.  A copy of instructions were sent to the patient via MyChart  unless otherwise noted below.     The patient was advised to call back or seek an in-person evaluation if the symptoms worsen or if the condition fails to improve as anticipated.  Time:  I spent 10 minutes with the patient via telehealth technology discussing the above problems/concerns.    Perlie Mayo, NP

## 2021-06-12 NOTE — Telephone Encounter (Signed)
°  Chief Complaint: pain with urination Symptoms: burning with urination Frequency: started yesterday-2/27 Pertinent Negatives: Patient denies fever, back pain Disposition: [] ED /[] Urgent Care (no appt availability in office) / [x] Appointment(In office/virtual)/ []  [facility]/ [] Home Care/ [] Refused Recommended Disposition /[] [facility]/ []  Follow-up with PCP Additional Notes: Patient given option- virtual care if she wants sooner appointment- she will decide and call back if she does that- to cancel appointment tomorrow

## 2021-06-12 NOTE — Patient Instructions (Signed)
Urinary Tract Infection, Adult A urinary tract infection (UTI) is an infection of any part of the urinary tract. The urinary tract includes the kidneys, ureters, bladder, and urethra. These organs make, store, and get rid of urine in the body. An upper UTI affects the ureters and kidneys. A lower UTI affects the bladder and urethra. What are the causes? Most urinary tract infections are caused by bacteria in your genital area around your urethra, where urine leaves your body. These bacteria grow and cause inflammation of your urinary tract. What increases the risk? You are more likely to develop this condition if: You have a urinary catheter that stays in place. You are not able to control when you urinate or have a bowel movement (incontinence). You are female and you: Use a spermicide or diaphragm for birth control. Have low estrogen levels. Are pregnant. You have certain genes that increase your risk. You are sexually active. You take antibiotic medicines. You have a condition that causes your flow of urine to slow down, such as: An enlarged prostate, if you are female. Blockage in your urethra. A kidney stone. A nerve condition that affects your bladder control (neurogenic bladder). Not getting enough to drink, or not urinating often. You have certain medical conditions, such as: Diabetes. A weak disease-fighting system (immunesystem). Sickle cell disease. Gout. Spinal cord injury. What are the signs or symptoms? Symptoms of this condition include: Needing to urinate right away (urgency). Frequent urination. This may include small amounts of urine each time you urinate. Pain or burning with urination. Blood in the urine. Urine that smells bad or unusual. Trouble urinating. Cloudy urine. Vaginal discharge, if you are female. Pain in the abdomen or the lower back. You may also have: Vomiting or a decreased appetite. Confusion. Irritability or tiredness. A fever or  chills. Diarrhea. The first symptom in older adults may be confusion. In some cases, they may not have any symptoms until the infection has worsened. How is this diagnosed? This condition is diagnosed based on your medical history and a physical exam. You may also have other tests, including: Urine tests. Blood tests. Tests for STIs (sexually transmitted infections). If you have had more than one UTI, a cystoscopy or imaging studies may be done to determine the cause of the infections. How is this treated? Treatment for this condition includes: Antibiotic medicine. Over-the-counter medicines to treat discomfort. Drinking enough water to stay hydrated. If you have frequent infections or have other conditions such as a kidney stone, you may need to see a health care provider who specializes in the urinary tract (urologist). In rare cases, urinary tract infections can cause sepsis. Sepsis is a life-threatening condition that occurs when the body responds to an infection. Sepsis is treated in the hospital with IV antibiotics, fluids, and other medicines. Follow these instructions at home: Medicines Take over-the-counter and prescription medicines only as told by your health care provider. If you were prescribed an antibiotic medicine, take it as told by your health care provider. Do not stop using the antibiotic even if you start to feel better. General instructions Make sure you: Empty your bladder often and completely. Do not hold urine for long periods of time. Empty your bladder after sex. Wipe from front to back after urinating or having a bowel movement if you are female. Use each tissue only one time when you wipe. Drink enough fluid to keep your urine pale yellow. Keep all follow-up visits. This is important. Contact a health care provider   if: Your symptoms do not get better after 1-2 days. Your symptoms go away and then return. Get help right away if: You have severe pain in your  back or your lower abdomen. You have a fever or chills. You have nausea or vomiting. Summary A urinary tract infection (UTI) is an infection of any part of the urinary tract, which includes the kidneys, ureters, bladder, and urethra. Most urinary tract infections are caused by bacteria in your genital area. Treatment for this condition often includes antibiotic medicines. If you were prescribed an antibiotic medicine, take it as told by your health care provider. Do not stop using the antibiotic even if you start to feel better. Keep all follow-up visits. This is important. This information is not intended to replace advice given to you by your health care provider. Make sure you discuss any questions you have with your health care provider. Document Revised: 11/12/2019 Document Reviewed: 11/12/2019 Elsevier Patient Education  2022 Elsevier Inc.  

## 2021-06-12 NOTE — Telephone Encounter (Signed)
Reason for Disposition  Unusual vaginal discharge (e.g., bad smelling, yellow, green, or foamy-white)  Answer Assessment - Initial Assessment Questions 1. SEVERITY: "How bad is the pain?"  (e.g., Scale 1-10; mild, moderate, or severe)   - MILD (1-3): complains slightly about urination hurting   - MODERATE (4-7): interferes with normal activities     - SEVERE (8-10): excruciating, unwilling or unable to urinate because of the pain      moderate 2. FREQUENCY: "How many times have you had painful urination today?"      Every time 3. PATTERN: "Is pain present every time you urinate or just sometimes?"      yes 4. ONSET: "When did the painful urination start?"      yesterday 5. FEVER: "Do you have a fever?" If Yes, ask: "What is your temperature, how was it measured, and when did it start?"     no 6. PAST UTI: "Have you had a urine infection before?" If Yes, ask: "When was the last time?" and "What happened that time?"      Yes- 2017 7. CAUSE: "What do you think is causing the painful urination?"  (e.g., UTI, scratch, Herpes sore)     UTI 8. OTHER SYMPTOMS: "Do you have any other symptoms?" (e.g., flank pain, vaginal discharge, genital sores, urgency, blood in urine)     Pain  9. PREGNANCY: "Is there any chance you are pregnant?" "When was your last menstrual period?"     IUD  Protocols used: Urination Pain - Female-A-AH

## 2021-06-13 ENCOUNTER — Other Ambulatory Visit: Payer: Self-pay

## 2021-06-13 ENCOUNTER — Other Ambulatory Visit (HOSPITAL_COMMUNITY)
Admission: RE | Admit: 2021-06-13 | Discharge: 2021-06-13 | Disposition: A | Discharge status: Home / Self Care | Payer: Federal, State, Local not specified - PPO | Source: Ambulatory Visit | Attending: Nurse Practitioner | Admitting: Nurse Practitioner

## 2021-06-13 ENCOUNTER — Encounter: Payer: Self-pay | Admitting: Nurse Practitioner

## 2021-06-13 ENCOUNTER — Ambulatory Visit: Payer: Federal, State, Local not specified - PPO | Admitting: Nurse Practitioner

## 2021-06-13 VITALS — BP 110/72 | HR 89 | Temp 98.2°F | Resp 18 | Ht 66.0 in | Wt 190.4 lb

## 2021-06-13 DIAGNOSIS — B379 Candidiasis, unspecified: Secondary | ICD-10-CM

## 2021-06-13 DIAGNOSIS — R300 Dysuria: Secondary | ICD-10-CM

## 2021-06-13 DIAGNOSIS — M5412 Radiculopathy, cervical region: Secondary | ICD-10-CM | POA: Insufficient documentation

## 2021-06-13 DIAGNOSIS — N3001 Acute cystitis with hematuria: Secondary | ICD-10-CM

## 2021-06-13 DIAGNOSIS — Z113 Encounter for screening for infections with a predominantly sexual mode of transmission: Secondary | ICD-10-CM | POA: Insufficient documentation

## 2021-06-13 DIAGNOSIS — R197 Diarrhea, unspecified: Secondary | ICD-10-CM | POA: Diagnosis not present

## 2021-06-13 DIAGNOSIS — T3695XA Adverse effect of unspecified systemic antibiotic, initial encounter: Secondary | ICD-10-CM

## 2021-06-13 LAB — POCT URINALYSIS DIPSTICK
Bilirubin, UA: NEGATIVE
Glucose, UA: NEGATIVE
Ketones, UA: NEGATIVE
Nitrite, UA: NEGATIVE
Protein, UA: POSITIVE — AB
Spec Grav, UA: 1.02 (ref 1.010–1.025)
Urobilinogen, UA: 0.2 E.U./dL
pH, UA: 5 (ref 5.0–8.0)

## 2021-06-13 MED ORDER — FLUCONAZOLE 150 MG PO TABS: 150.0000 mg | ORAL_TABLET | Freq: Once | ORAL | 0 refills | Status: AC

## 2021-06-13 NOTE — Progress Notes (Signed)
? ?BP 110/72   Pulse 89   Temp 98.2 ?F (36.8 ?C) (Oral)   Resp 18   Ht 5\' 6"  (1.676 m)   Wt 190 lb 6.4 oz (86.4 kg)   SpO2 93%   BMI 30.73 kg/m?   ? ?Subjective:  ? ? Patient ID: Courtney Alvarez, female    DOB: Sep 26, 1975, 46 y.o.   MRN: [id number] ? ?HPI: ?Courtney Alvarez is a 47 y.o. female, here alone ? ?Chief Complaint  ?Patient presents with  ? Urinary Tract Infection  ? ?Dysuria/frequency: Monday night started with dysuria then frequency.  She denies any flank pain or fever.  She is sexually active. She denies any vaginal itching, burning or discharge. She says that she did a video visit yesterday with [facility] provider and was given macrobid. She says that she did take one dose.  Obtained urine and will send for culture, will also collect vaginal swab. Discussed continue macrobid and pyridium that she received from video visit.  Will send in diflucan because she says she often gets yeast infections with antibiotics.  ? ?Diarrhea: She says she did have a couple episodes of diarrhea yesterday but none today.  Discussed that if it comes back we can do a stool culture.  Otherwise, push fluids. She is agreeable to plan.  ? ?Relevant past medical, surgical, family and social history reviewed and updated as indicated. Interim medical history since our last visit reviewed. ?Allergies and medications reviewed and updated. ? ?Review of Systems ? ?Constitutional: Negative for fever or weight change.  ?Respiratory: Negative for cough and shortness of breath.   ?Cardiovascular: Negative for chest pain or palpitations.  ?Gastrointestinal: Negative for abdominal pain, diarrhea yesterday ?Genitourinary:positive for dysuria, frequency ?Musculoskeletal: Negative for gait problem or joint swelling.  ?Skin: Negative for rash.  ?Neurological: Negative for dizziness or headache.  ?No other specific complaints in a complete review of systems (except as listed in HPI above).  ? ?   ?Objective:  ?  ?BP 110/72   Pulse 89   Temp 98.2  ?F (36.8 ?C) (Oral)   Resp 18   Ht 5\' 6"  (1.676 m)   Wt 190 lb 6.4 oz (86.4 kg)   SpO2 93%   BMI 30.73 kg/m?   ?Wt Readings from Last 3 Encounters:  ?06/13/21 190 lb 6.4 oz (86.4 kg)  ?04/25/21 195 lb (88.5 kg)  ?01/02/21 191 lb (86.6 kg)  ?  ?Physical Exam ? ?Constitutional: Patient appears well-developed and well-nourished.  No distress.  ?HEENT: head atraumatic, normocephalic, pupils equal and reactive to light,  neck supple ?Cardiovascular: Normal rate, regular rhythm and normal heart sounds.  No murmur heard. No BLE edema. ?Pulmonary/Chest: Effort normal and breath sounds normal. No respiratory distress. ?Abdominal: Soft.  There is no tenderness. No CVA tenderness ?Psychiatric: Patient has a normal mood and affect. behavior is normal. Judgment and thought content normal.  ? ?Results for orders placed or performed in visit on 06/13/21  ?POCT urinalysis dipstick  ?Result Value Ref Range  ? Color, UA gold   ? Clarity, UA cloudy   ? Glucose, UA Negative Negative  ? Bilirubin, UA negative   ? Ketones, UA negative   ? Spec Grav, UA 1.020 1.010 - 1.025  ? Blood, UA trace   ? pH, UA 5.0 5.0 - 8.0  ? Protein, UA Positive (A) Negative  ? Urobilinogen, UA 0.2 0.2 or 1.0 E.U./dL  ? Nitrite, UA negative   ? Leukocytes, UA Trace (A) Negative  ?  Appearance cloudy   ? Odor none   ? ?   ?Assessment & Plan:  ? ?1. Acute cystitis with hematuria ?-push fluids ?-take macrobid and pyridium ?- POCT urinalysis dipstick ?- Urine Culture ? ?2. Antibiotic-induced yeast infection ? ?- fluconazole (DIFLUCAN) 150 MG tablet; Take 1 tablet (150 mg total) by mouth once for 1 dose.  Dispense: 1 tablet; Refill: 0 ? ?3. Dysuria ? ?- Cervicovaginal ancillary only  ? ? ?4. Diarrhea, unspecified type ?-push fluids, if diarrhea returns let me know and we will do a stool sample ? ?Follow up plan: ?Return if symptoms worsen or fail to improve. ? ? ? ? ? ?

## 2021-06-14 LAB — URINE CULTURE
MICRO NUMBER:: 13074354
SPECIMEN QUALITY:: ADEQUATE

## 2021-06-15 ENCOUNTER — Encounter: Payer: Self-pay | Admitting: Nurse Practitioner

## 2021-06-15 LAB — CERVICOVAGINAL ANCILLARY ONLY
Bacterial Vaginitis (gardnerella): NEGATIVE
Candida Glabrata: NEGATIVE
Candida Vaginitis: NEGATIVE
Chlamydia: NEGATIVE
Comment: NEGATIVE
Comment: NEGATIVE
Comment: NEGATIVE
Comment: NEGATIVE
Comment: NEGATIVE
Comment: NORMAL
Neisseria Gonorrhea: NEGATIVE
Trichomonas: NEGATIVE

## 2021-06-19 ENCOUNTER — Encounter: Payer: Self-pay | Admitting: Nurse Practitioner

## 2021-06-19 ENCOUNTER — Telehealth: Payer: Federal, State, Local not specified - PPO | Admitting: Nurse Practitioner

## 2021-06-19 DIAGNOSIS — J0141 Acute recurrent pansinusitis: Secondary | ICD-10-CM

## 2021-06-19 MED ORDER — PREDNISONE 20 MG PO TABS: 20.0000 mg | ORAL_TABLET | Freq: Two times a day (BID) | ORAL | 0 refills | Status: AC

## 2021-06-19 NOTE — Progress Notes (Signed)
E-Visit for Sinus Problems ? ? ?Jeni I would prefer if you give the allergy medication a few more days to kick in. We do not like to repeat the same antibiotic within 30 days, since Amoxicillin no longer works for you we would need to go to a much stronger antibiotic if it is necessary.  ? ?I will prescribe a burst of steroids to help with the inflammation and pain/pressure from congestion. I recommend continuing the allergy regimen with an oral tablet and nasal spray.  ?You can also use a Mucinex product to help keep the mucous loose.  ?Stay on immune supportive vitamins: Vitamin C, D3 and Zinc  ?Assure you are drinking plenty of water.  ? ?Based on what you have shared with me it looks like you have sinusitis.  Sinusitis is inflammation and infection in the sinus cavities of the head.  Based on your presentation I believe you most likely have Acute Viral Sinusitis.This is an infection most likely caused by a virus. There is not specific treatment for viral sinusitis other than to help you with the symptoms until the infection runs its course.  You may use an oral decongestant such as Mucinex D or if you have glaucoma or high blood pressure use plain Mucinex. Saline nasal spray help and can safely be used as often as needed for congestion,  ?Some authorities believe that zinc sprays or the use of Echinacea may shorten the course of your symptoms. ? ?Sinus infections are not as easily transmitted as other respiratory infection, however we still recommend that you avoid close contact with loved ones, especially the very young and elderly.  Remember to wash your hands thoroughly throughout the day as this is the number one way to prevent the spread of infection! ? ?Home Care: ?Only take medications as instructed by your medical team. ?Do not take these medications with alcohol. ?A steam or ultrasonic humidifier can help congestion.  You can place a towel over your head and breathe in the steam from hot water coming  from a faucet. ?Avoid close contacts especially the very young and the elderly. ?Cover your mouth when you cough or sneeze. ?Always remember to wash your hands. ? ?Get Help Right Away If: ?You develop worsening fever or sinus pain. ?You develop a severe head ache or visual changes. ?Your symptoms persist after you have completed your treatment plan. ? ?Make sure you ?Understand these instructions. ?Will watch your condition. ?Will get help right away if you are not doing well or get worse. ? ? ?Thank you for choosing an e-visit. ? ?Your e-visit answers were reviewed by a board certified advanced clinical practitioner to complete your personal care plan. Depending upon the condition, your plan could have included both over the counter or prescription medications. ? ?Please review your pharmacy choice. Make sure the pharmacy is open so you can pick up prescription now. If there is a problem, you may contact your provider through [employer] and have the prescription routed to another pharmacy.  Your safety is important to [place]. If you have drug allergies check your prescription carefully.  ? ?For the next 24 hours you can use MyChart to ask questions about today's visit, request a non-urgent call back, or ask for a work or school excuse. ?You will get an email in the next two days asking about your experience. I hope that your e-visit has been valuable and will speed your recovery. ? I spent approximately 10 minutes reviewing the patient's history,  current symptoms and coordinating their care today.   ? ?Meds ordered this encounter  ?Medications  ? predniSONE (DELTASONE) 20 MG tablet  ?  Sig: Take 1 tablet (20 mg total) by mouth 2 (two) times daily with a meal for 5 days.  ?  Dispense:  10 tablet  ?  Refill:  0  ?  ?

## 2021-06-20 NOTE — Telephone Encounter (Signed)
Spoke with patient and she did a e-visit last nite per patient and she will call us if she is not feeling any better in the next few days.

## 2021-07-12 ENCOUNTER — Other Ambulatory Visit: Payer: Self-pay | Admitting: Nurse Practitioner

## 2021-07-12 DIAGNOSIS — Z1231 Encounter for screening mammogram for malignant neoplasm of breast: Secondary | ICD-10-CM

## 2021-07-15 DIAGNOSIS — H5213 Myopia, bilateral: Secondary | ICD-10-CM | POA: Insufficient documentation

## 2021-07-24 ENCOUNTER — Other Ambulatory Visit: Payer: Self-pay

## 2021-07-24 ENCOUNTER — Telehealth: Payer: Self-pay

## 2021-07-24 NOTE — Telephone Encounter (Signed)
Lvm for pt to call back and schedule her CPE ?

## 2021-08-03 DIAGNOSIS — R5382 Chronic fatigue, unspecified: Secondary | ICD-10-CM | POA: Diagnosis not present

## 2021-08-03 DIAGNOSIS — F411 Generalized anxiety disorder: Secondary | ICD-10-CM | POA: Diagnosis not present

## 2021-08-03 DIAGNOSIS — F321 Major depressive disorder, single episode, moderate: Secondary | ICD-10-CM | POA: Diagnosis not present

## 2021-08-03 DIAGNOSIS — F5105 Insomnia due to other mental disorder: Secondary | ICD-10-CM | POA: Diagnosis not present

## 2021-08-07 ENCOUNTER — Encounter: Payer: Self-pay | Admitting: Nurse Practitioner

## 2021-08-20 ENCOUNTER — Ambulatory Visit (INDEPENDENT_AMBULATORY_CARE_PROVIDER_SITE_OTHER): Payer: Federal, State, Local not specified - PPO | Admitting: Nurse Practitioner

## 2021-08-20 ENCOUNTER — Other Ambulatory Visit: Payer: Self-pay

## 2021-08-20 ENCOUNTER — Encounter: Payer: Self-pay | Admitting: Nurse Practitioner

## 2021-08-20 VITALS — BP 124/72 | HR 100 | Temp 98.0°F | Resp 16 | Ht 66.0 in | Wt 195.1 lb

## 2021-08-20 DIAGNOSIS — Z1211 Encounter for screening for malignant neoplasm of colon: Secondary | ICD-10-CM | POA: Diagnosis not present

## 2021-08-20 DIAGNOSIS — Z1159 Encounter for screening for other viral diseases: Secondary | ICD-10-CM

## 2021-08-20 DIAGNOSIS — E785 Hyperlipidemia, unspecified: Secondary | ICD-10-CM

## 2021-08-20 DIAGNOSIS — E039 Hypothyroidism, unspecified: Secondary | ICD-10-CM | POA: Diagnosis not present

## 2021-08-20 DIAGNOSIS — Z0000 Encounter for general adult medical examination without abnormal findings: Secondary | ICD-10-CM

## 2021-08-20 NOTE — Progress Notes (Addendum)
Name: Courtney Alvarez   MRN: [id number]    DOB: 07/22/1975   Date:08/20/2021 ? ?     Progress Note ? ?Subjective ? ?Chief Complaint ? ?Chief Complaint  ?Patient presents with  ? Annual Exam  ? ? ?HPI ? ?Patient presents for annual CPE. ? ?Diet: Well balanced diet, eating every couple hours, protein bars ?Exercise: crossfit, 5 days a week ?Sleep: at least 6 hours a night ? ?[place] [facility] Visit [facility]  ?AUDIT-C Score 0  ? ?  ? ?Depression: Phq 9 is  negative ? ?  08/20/2021  ? 10:45 AM 06/13/2021  ?  8:39 AM 02/02/2021  ? 11:39 AM 01/30/2021  ? 12:50 PM 01/02/2021  ?  3:48 PM  ?Depression screen PHQ 2/9  ?Decreased Interest 1 0 0 0 1  ?Down, Depressed, Hopeless 0 0 0 0 0  ?PHQ - 2 Score 1 0 0 0 1  ?Altered sleeping 0    3  ?Tired, decreased energy 0    0  ?Change in appetite 0    0  ?Feeling bad or failure about yourself  0    0  ?Trouble concentrating 0    1  ?Moving slowly or fidgety/restless 0    0  ?Suicidal thoughts 0    0  ?PHQ-9 Score 1    5  ? ?Hypertension: ?BP Readings from Last 3 Encounters:  ?08/20/21 124/72  ?06/13/21 110/72  ?04/25/21 124/86  ? ?Obesity: ?Wt Readings from Last 3 Encounters:  ?08/20/21 195 lb 1.6 oz (88.5 kg)  ?06/13/21 190 lb 6.4 oz (86.4 kg)  ?04/25/21 195 lb (88.5 kg)  ? ?BMI Readings from Last 3 Encounters:  ?08/20/21 31.49 kg/m?  ?06/13/21 30.73 kg/m?  ?04/25/21 31.47 kg/m?  ?  ? ?Vaccines:  ?HPV: up to at age 64 , ask insurance if age between 31-45  ?Shingrix: 50-64 yo and ask insurance if covered when patient above 41 yo ?Pneumonia:  educated and discussed with patient. ?Flu:  educated and discussed with patient. ? ?Hep C Screening: ordered ?STD testing and prevention (HIV/chl/gon/syphilis): 04/16/2015 ?Intimate partner violence:none ?Sexual History :yes, one partner ?Menstrual History/LMP/Abnormal Bleeding: Mirena ?Incontinence Symptoms: stress incontinence ? ?Breast cancer:  ?- Last Mammogram: 05/25/2020, scheduled for mammogram in June ?- BRCA  gene screening: none ? ?Osteoporosis: Discussed high calcium and vitamin D supplementation, weight bearing exercises ? ?Cervical cancer screening: 08/28/2016, She is going to go to [facility] ? ?Skin cancer: Discussed monitoring for atypical lesions  ?Colorectal cancer: 01/05/2016, she is due  ?Lung cancer:   Low Dose CT Chest recommended if Age 74-80 years, 20 pack-year currently smoking OR have quit w/in 15years. Patient does not qualify.   ?ECG: none ? ?Advanced Care Planning: A voluntary discussion about advance care planning including the explanation and discussion of advance directives.  Discussed health care proxy and Living will, and the patient was able to identify a health care proxy as husband.  Patient does have a living will at present time. If patient does have living will, I have requested they bring this to the clinic to be scanned in to their chart. ? ?Lipids: ?Lab Results  ?Component Value Date  ? CHOL 206 (H) 01/02/2021  ? CHOL 208 (H) 08/27/2018  ? CHOL 183 08/28/2016  ? ?Lab Results  ?Component Value Date  ? HDL 61 01/02/2021  ? HDL 73 08/27/2018  ? HDL 69 08/28/2016  ? ?Lab Results  ?Component Value Date  ? [place] 120 (  H) 01/02/2021  ? LDLCALC 118 (H) 08/27/2018  ? [place] 93 08/28/2016  ? ?Lab Results  ?Component Value Date  ? TRIG 141 01/02/2021  ? TRIG 80 08/27/2018  ? TRIG 106 08/28/2016  ? ?Lab Results  ?Component Value Date  ? CHOLHDL 3.4 01/02/2021  ? CHOLHDL 2.8 08/27/2018  ? CHOLHDL 2.7 08/28/2016  ? ?No results found for: LDLDIRECT ? ?Glucose: ?Glucose, Bld  ?Date Value Ref Range Status  ?01/02/2021 88 65 - 99 mg/dL Final  ?  Comment:  ?  . ?           Fasting reference interval ?. ?  ?09/09/2019 89 65 - 99 mg/dL Final  ?  Comment:  ?  . ?           Fasting reference interval ?. ?  ?05/12/2018 100 (H) 65 - 99 mg/dL Final  ?  Comment:  ?  . ?           Fasting reference interval ?. ?For someone without known diabetes, a glucose value ?between 100 and 125 mg/dL is consistent  with ?prediabetes and should be confirmed with a ?follow-up test. ?. ?  ? ? ?Patient Active Problem List  ? Diagnosis Date Noted  ? Myopia of both eyes 07/15/2021  ? Right cervical radiculopathy 06/13/2021  ? Anxiety 01/30/2021  ? Cervicalgia 01/30/2021  ? Disorder of upper arm joint 01/30/2021  ? Lesion of ulnar nerve 01/30/2021  ? Nontoxic uninodular goiter 01/30/2021  ? Observation for suspected condition 01/30/2021  ? Other personal history presenting hazards to health 01/30/2021  ? Personal history of return from [employer] deployment 01/30/2021  ? Screening for malignant neoplasm of cervix 01/30/2021  ? Sinusitis 01/30/2021  ? Vitamin D deficiency 01/02/2021  ? Other hypersomnia 12/12/2020  ? IUD (intrauterine device) in place 05/11/2020  ? Anxiety with depression 09/09/2019  ? Gastroesophageal reflux disease without esophagitis 08/26/2018  ? Moderate episode of recurrent major depressive disorder ([place]) 08/26/2018  ? GAD (generalized anxiety disorder) 08/26/2018  ? Dry eyes 08/26/2018  ? Immune to varicella 06/01/2017  ? Family history of colonic polyps   ? Benign neoplasm of sigmoid colon   ? Family history of colon cancer 07/31/2015  ? Obesity, morbid ([place]) 07/31/2015  ? Loss of feeling or sensation 06/22/2015  ? Chronic pain in left foot 06/22/2015  ? 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[place]) 12/16/2013  ? Arthralgia of multiple joints 12/16/2013  ? Goiter, nontoxic, 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  ? Adult hypothyroidism 09/07/2013  ? ? ?Past Surgical History:  ?Procedure Laterality Date  ? BLADDER SURGERY    ? CERVICAL BIOPSY  W/ LOOP ELECTRODE EXCISION    ? COLONOSCOPY WITH PROPOFOL N/A 01/05/2016  ? Procedure: COLONOSCOPY WITH PROPOFOL;  Surgeon: Lucilla Lame, MD;  Location: [place];  Service: Endoscopy;  Laterality: N/A;  ? FOOT SURGERY Left   ? POLYPECTOMY  01/05/2016  ? Procedure: POLYPECTOMY;  Surgeon: Lucilla Lame, MD;  Location: [place];  Service: Endoscopy;;   ? ? ?Family History  ?Problem Relation Age of Onset  ? Arthritis Mother   ? COPD Mother   ? Thyroid disease Mother   ? Arthritis Father   ? Hyperlipidemia Father   ? Cancer Father   ?     polyp removed  ? Heart disease Father   ? Hashimoto's thyroiditis Sister   ? Arthritis Maternal Grandmother   ? Cancer Maternal Grandmother   ?     colon  ? Heart  disease Maternal Grandmother   ? Thyroid disease Maternal Grandmother   ? Alcohol abuse Maternal Grandfather   ? Arthritis Maternal Grandfather   ? Arthritis Paternal Grandmother   ? Depression Paternal Grandmother   ? Hypertension Paternal Grandmother   ? Stroke Paternal Grandmother   ? Arthritis Paternal Grandfather   ? Heart disease Paternal Grandfather   ? Hypertension Paternal Grandfather   ? Breast cancer Other   ? ? ?Social History  ? ?Socioeconomic History  ? Marital status: Married  ?  Spouse name: Annie Main  ? Number of children: 1  ? Years of education: Not on file  ? Highest education level: Associate degree: occupational, Hotel manager, or vocational program  ?Occupational History  ? Occupation: Therapist, sports  ?Tobacco Use  ? Smoking status: Former  ?  Types: Cigarettes  ?  Quit date: 06/14/2003  ?  Years since quitting: 18.1  ? Smokeless tobacco: Never  ? Tobacco comments:  ?  quit 06/2003  ?Vaping Use  ? Vaping Use: Never used  ?Substance and Sexual Activity  ? Alcohol use: Yes  ?  Alcohol/week: 0.0 standard drinks  ?  Comment: occasionally   ? Drug use: No  ? Sexual activity: Yes  ?  Partners: Male  ?  Birth control/protection: I.U.D.  ?  Comment: mirena  ?Other Topics Concern  ? Not on file  ?Social History Narrative  ? Not on file  ? ?Social Determinants of Health  ? ?Financial Resource Strain: Low Risk   ? Difficulty of Paying Living Expenses: Not hard at all  ?Food Insecurity: No Food Insecurity  ? Worried About Charity fundraiser in the Last Year: Never true  ? Ran Out of Food in the Last Year: Never true  ?Transportation Needs: No Transportation Needs  ? Lack of  Transportation (Medical): No  ? Lack of Transportation (Non-Medical): No  ?Physical Activity: Sufficiently Active  ? Days of Exercise per Week: 5 days  ? Minutes of Exercise per Session: 60 min  ?Stress: No Stress Concern Pres

## 2021-08-21 ENCOUNTER — Telehealth: Payer: Self-pay

## 2021-08-21 LAB — HEPATITIS C ANTIBODY
Hepatitis C Ab: NONREACTIVE
SIGNAL TO CUT-OFF: 0.12 (ref <1.00)

## 2021-08-21 LAB — LIPID PANEL
Cholesterol: 198 mg/dL (ref <200)
HDL: 72 mg/dL (ref >=50)
LDL Cholesterol (Calc): 99 mg/dL (calc)
Non-HDL Cholesterol (Calc): 126 mg/dL (calc) (ref <130)
Total CHOL/HDL Ratio: 2.8 (calc) (ref <5.0)
Triglycerides: 167 mg/dL — ABNORMAL HIGH (ref <150)

## 2021-08-21 LAB — TSH: TSH: 1.34 mIU/L

## 2021-08-21 NOTE — Telephone Encounter (Signed)
CALLED PATIENT NO ANSWER LEFT VOICEMAIL FOR A CALL BACK ? ?

## 2021-08-21 NOTE — Telephone Encounter (Signed)
CALLED PATIENT NO ANSWER LEFT VOICEMAIL FOR A CALL BACK °Letter sent °

## 2021-08-27 ENCOUNTER — Telehealth: Payer: Self-pay

## 2021-08-27 ENCOUNTER — Other Ambulatory Visit: Payer: Self-pay

## 2021-08-27 DIAGNOSIS — Z86010 Personal history of colonic polyps: Secondary | ICD-10-CM

## 2021-08-27 NOTE — Telephone Encounter (Signed)
Gastroenterology Pre-Procedure Review ? ?Request Date: 11/16/21 ?Requesting Physician: Dr. Allen Norris ? ?PATIENT REVIEW QUESTIONS: The patient responded to the following health history questions as indicated:   ? ?1. Are you having any GI issues? no ?2. Do you have a personal history of Polyps? Yes 01/05/16 performed by Dr.Wohl ?3. Do you have a family history of Colon Cancer or Polyps? yes (father colon polyps,  maternal grandmother colon cancer) ?4. Diabetes Mellitus? no ?5. Joint replacements in the past 12 months?no ?6. Major health problems in the past 3 months?no ?7. Any artificial heart valves, MVP, or defibrillator?no ?   ?MEDICATIONS & ALLERGIES:    ?Patient reports the following regarding taking any anticoagulation/antiplatelet therapy:   ?Plavix, Coumadin, Eliquis, Xarelto, Lovenox, Pradaxa, Brilinta, or Effient? no ?Aspirin? no ? ?Patient confirms/reports the following medications:  ?Current Outpatient Medications  ?Medication Sig Dispense Refill  ? Acetaminophen 325 MG CAPS     ? amphetamine-dextroamphetamine (ADDERALL XR) 20 MG 24 hr capsule Take 20 mg by mouth daily.    ? fluconazole (DIFLUCAN) 150 MG tablet Take 150 mg by mouth once. (Patient not taking: Reported on 08/20/2021)    ? FLUoxetine HCl 60 MG TABS Take by mouth.    ? lansoprazole (PREVACID) 15 MG capsule     ? Multiple Vitamins-Minerals (MULTIVITAMIN GUMMIES WOMENS) CHEW Chew 2 tablets by mouth daily.     ? Omega-3 1000 MG CAPS Take by mouth.    ? thyroid (NP THYROID) 120 MG tablet Take 1 tablet (120 mg total) by mouth daily before breakfast. 90 tablet 3  ? venlafaxine XR (EFFEXOR-XR) 150 MG 24 hr capsule TK 1 C PO D  1  ? ?No current facility-administered medications for this visit.  ? ? ?Patient confirms/reports the following allergies:  ?No Known Allergies ? ?No orders of the defined types were placed in this encounter. ? ? ?AUTHORIZATION INFORMATION ?Primary Insurance: ?1D#: ?Group #: ? ?Secondary Insurance: ?1D#: ?Group #: ? ?SCHEDULE  INFORMATION: ?Date: 11/16/21 ?Time: ?Location: [facility] ?

## 2021-08-27 NOTE — Telephone Encounter (Signed)
Returned patients call to schedule her screenig colonoscopy with Dr. Allen Norris. LVM for her to call back to schedule. ? ?Thanks, ?Sharyn Lull, CMA ?

## 2021-09-06 DIAGNOSIS — M2578 Osteophyte, vertebrae: Secondary | ICD-10-CM | POA: Diagnosis not present

## 2021-09-06 DIAGNOSIS — M5412 Radiculopathy, cervical region: Secondary | ICD-10-CM | POA: Diagnosis not present

## 2021-09-06 DIAGNOSIS — M4802 Spinal stenosis, cervical region: Secondary | ICD-10-CM | POA: Diagnosis not present

## 2021-09-21 ENCOUNTER — Ambulatory Visit: Payer: Federal, State, Local not specified - PPO | Admitting: Nurse Practitioner

## 2021-09-21 ENCOUNTER — Encounter: Payer: Self-pay | Admitting: Nurse Practitioner

## 2021-09-21 ENCOUNTER — Other Ambulatory Visit: Payer: Self-pay

## 2021-09-21 VITALS — BP 120/74 | HR 88 | Temp 98.6°F | Resp 18 | Ht 66.0 in | Wt 196.6 lb

## 2021-09-21 DIAGNOSIS — H60311 Diffuse otitis externa, right ear: Secondary | ICD-10-CM

## 2021-09-21 DIAGNOSIS — J0141 Acute recurrent pansinusitis: Secondary | ICD-10-CM | POA: Diagnosis not present

## 2021-09-21 MED ORDER — PREDNISONE 10 MG (21) PO TBPK: ORAL_TABLET | ORAL | 0 refills | Status: DC

## 2021-09-21 MED ORDER — OFLOXACIN 0.3 % OT SOLN: 10.0000 [drp] | Freq: Every day | OTIC | 0 refills | Status: AC

## 2021-09-21 NOTE — Assessment & Plan Note (Signed)
Continue taking allergy medication, Mucinex.  Sent in steroid pack.  May use warm compresses across face.  Can also do saline nasal wash.

## 2021-09-21 NOTE — Progress Notes (Addendum)
BP 120/74   Pulse 88   Temp 98.6 F (37 C) (Oral)   Resp 18   Ht '5\' 6"'$  (1.676 m)   Wt 196 lb 9.6 oz (89.2 kg)   SpO2 99%   BMI 31.73 kg/m    Subjective:    Patient ID: Courtney Alvarez, female    DOB: 1975-10-03, 46 y.o.   MRN: [id number]  HPI: Courtney Alvarez is a 46 y.o. female  Chief Complaint  Patient presents with   Sinus Problem   Ear Pain   Sinus infection/ right ear pain: Symptoms for about two weeks.  She says she has been having right ear and sinus pain. Patient reports nasal congestion, facial pressure and tenderness. Patient denies any cough, sore throat or fever.  Patient has been taking mucinex, sudafed, and nasal spray.  Patient stays that it has gotten too bad to ignore.  Right ear canal is read and inflamed.  Will send in antibiotic ear drops and steroid pack for treatment. Pt is in agreement with plan.   Relevant past medical, surgical, family and social history reviewed and updated as indicated. Interim medical history since our last visit reviewed. Allergies and medications reviewed and updated.  Review of Systems  Constitutional: Negative for fever or weight change.  HEENT: positive for nasal congestion, facial tenderness and right ear pain Respiratory: Negative for cough and shortness of breath.   Cardiovascular: Negative for chest pain or palpitations.  Gastrointestinal: Negative for abdominal pain, no bowel changes.  Musculoskeletal: Negative for gait problem or joint swelling.  Skin: Negative for rash.  Neurological: Negative for dizziness or headache.  No other specific complaints in a complete review of systems (except as listed in HPI above).      Objective:    BP 120/74   Pulse 88   Temp 98.6 F (37 C) (Oral)   Resp 18   Ht '5\' 6"'$  (1.676 m)   Wt 196 lb 9.6 oz (89.2 kg)   SpO2 99%   BMI 31.73 kg/m   Wt Readings from Last 3 Encounters:  09/21/21 196 lb 9.6 oz (89.2 kg)  08/20/21 195 lb 1.6 oz (88.5 kg)  06/13/21 190 lb 6.4 oz (86.4 kg)     Physical Exam  Constitutional: Patient appears well-developed and well-nourished. Obese  No distress.  HEENT: head atraumatic, normocephalic, pupils equal and reactive to light, ears left TM clear, right TM red, ear canal red and inflamed, neck supple, throat within normal limits Cardiovascular: Normal rate, regular rhythm and normal heart sounds.  No murmur heard. No BLE edema. Pulmonary/Chest: Effort normal and breath sounds normal. No respiratory distress. Abdominal: Soft.  There is no tenderness. Psychiatric: Patient has a normal mood and affect. behavior is normal. Judgment and thought content normal.  Results for orders placed or performed in visit on 08/20/21  Lipid panel  Result Value Ref Range   Cholesterol 198 <200 mg/dL   HDL 72 > OR = 50 mg/dL   Triglycerides 167 (H) <150 mg/dL   LDL Cholesterol (Calc) 99 mg/dL (calc)   Total CHOL/HDL Ratio 2.8 <5.0 (calc)   Non-HDL Cholesterol (Calc) 126 <130 mg/dL (calc)  TSH  Result Value Ref Range   TSH 1.34 mIU/L  Hepatitis C Antibody  Result Value Ref Range   Hepatitis C Ab NON-REACTIVE NON-REACTIVE   SIGNAL TO CUT-OFF 0.12 <1.00      Assessment & Plan:   Problem List Items Addressed This Visit       Respiratory  Sinusitis    Continue taking allergy medication, Mucinex.  Sent in steroid pack.  May use warm compresses across face.  Can also do saline nasal wash.      Relevant Medications   predniSONE (STERAPRED UNI-PAK 21 TAB) 10 MG (21) TBPK tablet   Other Visit Diagnoses     Acute diffuse otitis externa of right ear    -  Primary   Antibiotic drops sent in for right ear.   Relevant Medications   ofloxacin (FLOXIN OTIC) 0.3 % OTIC solution        Follow up plan: Return if symptoms worsen or fail to improve.

## 2021-09-24 ENCOUNTER — Ambulatory Visit
Admission: RE | Admit: 2021-09-24 | Discharge: 2021-09-24 | Disposition: A | Discharge status: Home / Self Care | Payer: Federal, State, Local not specified - PPO | Source: Ambulatory Visit | Attending: Nurse Practitioner | Admitting: Nurse Practitioner

## 2021-09-24 DIAGNOSIS — Z1231 Encounter for screening mammogram for malignant neoplasm of breast: Secondary | ICD-10-CM | POA: Diagnosis not present

## 2021-10-30 ENCOUNTER — Other Ambulatory Visit: Payer: Self-pay

## 2021-10-30 DIAGNOSIS — F5105 Insomnia due to other mental disorder: Secondary | ICD-10-CM | POA: Diagnosis not present

## 2021-10-30 DIAGNOSIS — F411 Generalized anxiety disorder: Secondary | ICD-10-CM | POA: Diagnosis not present

## 2021-10-30 DIAGNOSIS — R5382 Chronic fatigue, unspecified: Secondary | ICD-10-CM | POA: Diagnosis not present

## 2021-10-30 DIAGNOSIS — F321 Major depressive disorder, single episode, moderate: Secondary | ICD-10-CM | POA: Diagnosis not present

## 2021-10-30 MED ORDER — VENLAFAXINE HCL ER 75 MG PO CP24
ORAL_CAPSULE | Freq: Every day | ORAL | 0 refills | Status: DC
  Filled 2021-10-30: qty 90, 90d supply, fill #0

## 2021-10-30 MED ORDER — AMPHETAMINE-DEXTROAMPHET ER 10 MG PO CP24
ORAL_CAPSULE | Freq: Every day | ORAL | 0 refills | Status: DC
  Filled 2021-10-30: qty 90, 90d supply, fill #0

## 2021-10-30 MED ORDER — FLUOXETINE HCL 60 MG PO TABS
ORAL_TABLET | ORAL | 0 refills | Status: DC
  Filled 2021-10-30: qty 90, 90d supply, fill #0

## 2021-10-31 ENCOUNTER — Other Ambulatory Visit: Payer: Self-pay

## 2021-11-01 ENCOUNTER — Encounter: Payer: Self-pay | Admitting: Nurse Practitioner

## 2021-11-06 ENCOUNTER — Telehealth: Payer: Federal, State, Local not specified - PPO | Admitting: Physician Assistant

## 2021-11-06 ENCOUNTER — Encounter: Payer: Self-pay | Admitting: Physician Assistant

## 2021-11-06 DIAGNOSIS — Z20822 Contact with and (suspected) exposure to covid-19: Secondary | ICD-10-CM

## 2021-11-06 MED ORDER — NIRMATRELVIR/RITONAVIR (PAXLOVID)TABLET: 3.0000 | ORAL_TABLET | Freq: Two times a day (BID) | ORAL | 0 refills | Status: AC

## 2021-11-06 MED ORDER — COVID-19 AT HOME ANTIGEN TEST VI KIT: PACK | 0 refills | Status: DC

## 2021-11-06 MED ORDER — BENZONATATE 100 MG PO CAPS: 100.0000 mg | ORAL_CAPSULE | Freq: Three times a day (TID) | ORAL | 0 refills | Status: DC | PRN

## 2021-11-06 NOTE — Patient Instructions (Addendum)
Courtney Alvarez, thank you for joining Leeanne Rio, PA-C for today's virtual visit.  While this provider is not your primary care provider (PCP), if your PCP is located in our provider database this encounter information will be shared with them immediately following your visit.  Consent: (Patient) Curtisha D Math provided verbal consent for this virtual visit at the beginning of the encounter.  Current Medications:  Current Outpatient Medications:    benzonatate (TESSALON) 100 MG capsule, Take 1 capsule (100 mg total) by mouth 3 (three) times daily as needed for cough., Disp: 30 capsule, Rfl: 0   COVID-19 At Home Antigen Test KIT, Use as directed., Disp: 1 kit, Rfl: 0   Acetaminophen 325 MG CAPS, , Disp: , Rfl:    amphetamine-dextroamphetamine (ADDERALL XR) 10 MG 24 hr capsule, Take 1 daily, Disp: 90 capsule, Rfl: 0   amphetamine-dextroamphetamine (ADDERALL XR) 20 MG 24 hr capsule, Take 20 mg by mouth daily., Disp: , Rfl:    FLUoxetine HCl 60 MG TABS, Take by mouth., Disp: , Rfl:    FLUoxetine HCl 60 MG TABS, Take 1 daily withbreakfast, Disp: 90 tablet, Rfl: 0   lansoprazole (PREVACID) 15 MG capsule, , Disp: , Rfl:    Multiple Vitamins-Minerals (MULTIVITAMIN GUMMIES WOMENS) CHEW, Chew 2 tablets by mouth daily. , Disp: , Rfl:    Omega-3 1000 MG CAPS, Take by mouth., Disp: , Rfl:    thyroid (NP THYROID) 120 MG tablet, Take 1 tablet (120 mg total) by mouth daily before breakfast., Disp: 90 tablet, Rfl: 3   venlafaxine XR (EFFEXOR XR) 75 MG 24 hr capsule, Take 1 daily, Disp: 90 capsule, Rfl: 0   venlafaxine XR (EFFEXOR-XR) 150 MG 24 hr capsule, TK 1 C PO D, Disp: , Rfl: 1   Medications ordered in this encounter:  Meds ordered this encounter  Medications   COVID-19 At Home Antigen Test KIT    Sig: Use as directed.    Dispense:  1 kit    Refill:  0    Order Specific Question:   Supervising Provider    Answer:   Sabra Heck, BRIAN [[id number]]   benzonatate (TESSALON) 100 MG capsule    Sig: Take  1 capsule (100 mg total) by mouth 3 (three) times daily as needed for cough.    Dispense:  30 capsule    Refill:  0    Order Specific Question:   Supervising Provider    Answer:   Sabra Heck, [place]     *If you need refills on other medications prior to your next appointment, please contact your pharmacy*  Follow-Up: Call back or seek an in-person evaluation if the symptoms worsen or if the condition fails to improve as anticipated.  Other Instructions Please keep well-hydrated and get plenty of rest. Start a saline nasal rinse to flush out your nasal passages. You can use plain Mucinex to help thin congestion. If you have a humidifier, running in the bedroom at night. I want you to start OTC vitamin D3 1000 units daily, vitamin C 1000 mg daily, and a zinc supplement. Please take prescribed medications as directed.  You have been enrolled in a MyChart symptom monitoring program. Please answer these questions daily so we can keep track of how you are doing.  You were to quarantine for 5 days from onset of your symptoms.  After day 5, if you have had no fever and you are feeling better, you can end quarantine but need to mask for an additional 5  days. After day 5 if you have a fever or are having significant symptoms, please quarantine for full 10 days.  If you note any worsening of symptoms, any significant shortness of breath or any chest pain, please seek ER evaluation ASAP.  Please do not delay care!  COVID-19: What to Do if You Are Sick If you test positive and are an older adult or someone who is at high risk of getting very sick from COVID-19, treatment may be available. Contact a healthcare provider right away after a positive test to determine if you are eligible, even if your symptoms are mild right now. You can also visit a Test to Treat location and, if eligible, receive a prescription from a provider. Don't delay: Treatment must be started within the first few days to be  effective. If you have a fever, cough, or other symptoms, you might have COVID-19. Most people have mild illness and are able to recover at home. If you are sick: Keep track of your symptoms. If you have an emergency warning sign (including trouble breathing), call 911. Steps to help prevent the spread of COVID-19 if you are sick If you are sick with COVID-19 or think you might have COVID-19, follow the steps below to care for yourself and to help protect other people in your home and community. Stay home except to get medical care Stay home. Most people with COVID-19 have mild illness and can recover at home without medical care. Do not leave your home, except to get medical care. Do not visit public areas and do not go to places where you are unable to wear a mask. Take care of yourself. Get rest and stay hydrated. Take over-the-counter medicines, such as acetaminophen, to help you feel better. Stay in touch with your doctor. Call before you get medical care. Be sure to get care if you have trouble breathing, or have any other emergency warning signs, or if you think it is an emergency. Avoid public transportation, ride-sharing, or taxis if possible. Get tested If you have symptoms of COVID-19, get tested. While waiting for test results, stay away from others, including staying apart from those living in your household. Get tested as soon as possible after your symptoms start. Treatments may be available for people with COVID-19 who are at risk for becoming very sick. Don't delay: Treatment must be started early to be effective--some treatments must begin within 5 days of your first symptoms. Contact your healthcare provider right away if your test result is positive to determine if you are eligible. Self-tests are one of several options for testing for the virus that causes COVID-19 and may be more convenient than laboratory-based tests and point-of-care tests. Ask your healthcare provider or your  local health department if you need help interpreting your test results. You can visit your state, tribal, local, and territorial health department's website to look for the latest local information on testing sites. Separate yourself from other people As much as possible, stay in a specific room and away from other people and pets in your home. If possible, you should use a separate bathroom. If you need to be around other people or animals in or outside of the home, wear a well-fitting mask. Tell your close contacts that they may have been exposed to COVID-19. An infected person can spread COVID-19 starting 48 hours (or 2 days) before the person has any symptoms or tests positive. By letting your close contacts know they may have been exposed  to COVID-19, you are helping to protect everyone. See COVID-19 and Animals if you have questions about pets. If you are diagnosed with COVID-19, someone from the health department may call you. Answer the call to slow the spread. Monitor your symptoms Symptoms of COVID-19 include fever, cough, or other symptoms. Follow care instructions from your healthcare provider and local health department. Your local health authorities may give instructions on checking your symptoms and reporting information. When to seek emergency medical attention Look for emergency warning signs* for COVID-19. If someone is showing any of these signs, seek emergency medical care immediately: Trouble breathing Persistent pain or pressure in the chest New confusion Inability to wake or stay awake Pale, gray, or blue-colored skin, lips, or nail beds, depending on skin tone *This list is not all possible symptoms. Please call your medical provider for any other symptoms that are severe or concerning to you. Call 911 or call ahead to your local emergency facility: Notify the operator that you are seeking care for someone who has or may have COVID-19. Call ahead before visiting your  doctor Call ahead. Many medical visits for routine care are being postponed or done by phone or telemedicine. If you have a medical appointment that cannot be postponed, call your doctor's office, and tell them you have or may have COVID-19. This will help the office protect themselves and other patients. If you are sick, wear a well-fitting mask You should wear a mask if you must be around other people or animals, including pets (even at home). Wear a mask with the best fit, protection, and comfort for you. You don't need to wear the mask if you are alone. If you can't put on a mask (because of trouble breathing, for example), cover your coughs and sneezes in some other way. Try to stay at least 6 feet away from other people. This will help protect the people around you. Masks should not be placed on young children under age 59 years, anyone who has trouble breathing, or anyone who is not able to remove the mask without help. Cover your coughs and sneezes Cover your mouth and nose with a tissue when you cough or sneeze. Throw away used tissues in a lined trash can. Immediately wash your hands with soap and water for at least 20 seconds. If soap and water are not available, clean your hands with an alcohol-based hand sanitizer that contains at least 60% alcohol. Clean your hands often Wash your hands often with soap and water for at least 20 seconds. This is especially important after blowing your nose, coughing, or sneezing; going to the bathroom; and before eating or preparing food. Use hand sanitizer if soap and water are not available. Use an alcohol-based hand sanitizer with at least 60% alcohol, covering all surfaces of your hands and rubbing them together until they feel dry. Soap and water are the best option, especially if hands are visibly dirty. Avoid touching your eyes, nose, and mouth with unwashed hands. Handwashing Tips Avoid sharing personal household items Do not share dishes,  drinking glasses, cups, eating utensils, towels, or bedding with other people in your home. Wash these items thoroughly after using them with soap and water or put in the dishwasher. Clean surfaces in your home regularly Clean and disinfect high-touch surfaces (for example, doorknobs, tables, handles, light switches, and countertops) in your "sick room" and bathroom. In shared spaces, you should clean and disinfect surfaces and items after each use by the person who  is ill. If you are sick and cannot clean, a caregiver or other person should only clean and disinfect the area around you (such as your bedroom and bathroom) on an as needed basis. Your caregiver/other person should wait as long as possible (at least several hours) and wear a mask before entering, cleaning, and disinfecting shared spaces that you use. Clean and disinfect areas that may have blood, stool, or body fluids on them. Use household cleaners and disinfectants. Clean visible dirty surfaces with household cleaners containing soap or detergent. Then, use a household disinfectant. Use a product from [employer] List N: Disinfectants for Coronavirus (BZJIR-67). Be sure to follow the instructions on the label to ensure safe and effective use of the product. Many products recommend keeping the surface wet with a disinfectant for a certain period of time (look at "contact time" on the product label). You may also need to wear personal protective equipment, such as gloves, depending on the directions on the product label. Immediately after disinfecting, wash your hands with soap and water for 20 seconds. For completed guidance on cleaning and disinfecting your home, visit Complete Disinfection Guidance. Take steps to improve ventilation at home Improve ventilation (air flow) at home to help prevent from spreading COVID-19 to other people in your household. Clear out COVID-19 virus particles in the air by opening windows, using air filters, and  turning on fans in your home. Use this interactive tool to learn how to improve air flow in your home. When you can be around others after being sick with COVID-19 Deciding when you can be around others is different for different situations. Find out when you can safely end home isolation. For any additional questions about your care, contact your healthcare provider or state or local health department. 07/04/2020 Content source: [facility] ([facility]), Division of Viral Diseases This information is not intended to replace advice given to you by your health care provider. Make sure you discuss any questions you have with your health care provider. Document Revised: 08/17/2020 Document Reviewed: 08/17/2020 Elsevier Patient Education  2022 [employer].      If you have been instructed to have an in-person evaluation today at a local Urgent Care facility, please use the link below. It will take you to a list of all of our available [facility], including address, phone number and hours of operation. Please do not delay care.  [facility]  If you or a family member do not have a primary care provider, use the link below to schedule a visit and establish care. When you choose a [facility] primary care physician or advanced practice provider, you gain a long-term partner in health. Find a Primary Care Provider  Learn more about [facility]'s in-office and virtual care options: [facility]

## 2021-11-06 NOTE — Progress Notes (Signed)
Virtual Visit Consent   Courtney Alvarez, you are scheduled for a virtual visit with a [facility] provider today. Just as with appointments in the office, your consent must be obtained to participate. Your consent will be active for this visit and any virtual visit you may have with one of our providers in the next 365 days. If you have a MyChart account, a copy of this consent can be sent to you electronically.  As this is a virtual visit, video technology does not allow for your provider to perform a traditional examination. This may limit your provider's ability to fully assess your condition. If your provider identifies any concerns that need to be evaluated in person or the need to arrange testing (such as labs, EKG, etc.), we will make arrangements to do so. Although advances in technology are sophisticated, we cannot ensure that it will always work on either your end or our end. If the connection with a video visit is poor, the visit may have to be switched to a telephone visit. With either a video or telephone visit, we are not always able to ensure that we have a secure connection.  By engaging in this virtual visit, you consent to the provision of healthcare and authorize for your insurance to be billed (if applicable) for the services provided during this visit. Depending on your insurance coverage, you may receive a charge related to this service.  I need to obtain your verbal consent now. Are you willing to proceed with your visit today? Courtney Alvarez has provided verbal consent on 11/06/2021 for a virtual visit (video or telephone). Leeanne Rio, [place]  Date: 11/06/2021 5:26 PM  Virtual Visit via Video Note   I, Leeanne Rio, connected with  Courtney Alvarez  ([id number], 1975-08-10) on 11/06/21 at  2:30 PM EDT by a video-enabled telemedicine application and verified that I am speaking with the correct person using two identifiers.  Location: Patient: Virtual Visit Location  Patient: Home Provider: Virtual Visit Location Provider: Home Office   I discussed the limitations of evaluation and management by telemedicine and the availability of in person appointments. The patient expressed understanding and agreed to proceed.    History of Present Illness: Courtney Alvarez is a 46 y.o. who identifies as a female who was assigned female at birth, and is being seen today for 3 days of fatigue, fever (Tmax 101.7), chest congestion and cough. Cough is mainly productive now. Denies GI symptoms. Is eating and hydrating well. 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 Is alternating between tylenol and ibuprofen for headache. Has not tested for COVID at present.     HPI: HPI  Problems:  Patient Active Problem List   Diagnosis Date Noted   Myopia of both eyes 07/15/2021   Right cervical radiculopathy 06/13/2021   Anxiety 01/30/2021   Cervicalgia 01/30/2021   Disorder of upper arm joint 01/30/2021   Lesion of ulnar nerve 01/30/2021   Nontoxic uninodular goiter 01/30/2021   Observation for suspected condition 01/30/2021   Other personal history presenting hazards to health 01/30/2021   Personal history of return from [employer] deployment 01/30/2021   Screening for malignant neoplasm of cervix 01/30/2021   Sinusitis 01/30/2021   Vitamin D deficiency 01/02/2021   Other hypersomnia 12/12/2020   IUD (intrauterine device) in place 05/11/2020   Anxiety with depression 09/09/2019   Gastroesophageal reflux disease without esophagitis 08/26/2018   Moderate episode of recurrent major depressive  disorder ([place]) 08/26/2018   GAD (generalized anxiety disorder) 08/26/2018   Dry eyes 08/26/2018   Immune to varicella 06/01/2017   Family history of colonic polyps    Benign neoplasm of sigmoid colon    Family history of colon cancer 07/31/2015   Obesity, morbid ([place]) 07/31/2015   Loss of feeling or sensation 06/22/2015   Chronic pain in left foot 06/22/2015    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[place]) 12/16/2013   Arthralgia of multiple joints 12/16/2013   Goiter, nontoxic, 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   Adult hypothyroidism 09/07/2013    Allergies: No Known Allergies Medications:  Current Outpatient Medications:    benzonatate (TESSALON) 100 MG capsule, Take 1 capsule (100 mg total) by mouth 3 (three) times daily as needed for cough., Disp: 30 capsule, Rfl: 0   COVID-19 At Home Antigen Test KIT, Use as directed., Disp: 1 kit, Rfl: 0   Acetaminophen 325 MG CAPS, , Disp: , Rfl:    amphetamine-dextroamphetamine (ADDERALL XR) 10 MG 24 hr capsule, Take 1 daily, Disp: 90 capsule, Rfl: 0   amphetamine-dextroamphetamine (ADDERALL XR) 20 MG 24 hr capsule, Take 20 mg by mouth daily., Disp: , Rfl:    FLUoxetine HCl 60 MG TABS, Take by mouth., Disp: , Rfl:    FLUoxetine HCl 60 MG TABS, Take 1 daily withbreakfast, Disp: 90 tablet, Rfl: 0   lansoprazole (PREVACID) 15 MG capsule, , Disp: , Rfl:    Multiple Vitamins-Minerals (MULTIVITAMIN GUMMIES WOMENS) CHEW, Chew 2 tablets by mouth daily. , Disp: , Rfl:    Omega-3 1000 MG CAPS, Take by mouth., Disp: , Rfl:    thyroid (NP THYROID) 120 MG tablet, Take 1 tablet (120 mg total) by mouth daily before breakfast., Disp: 90 tablet, Rfl: 3   venlafaxine XR (EFFEXOR XR) 75 MG 24 hr capsule, Take 1 daily, Disp: 90 capsule, Rfl: 0   venlafaxine XR (EFFEXOR-XR) 150 MG 24 hr capsule, TK 1 C PO D, Disp: , Rfl: 1  Observations/Objective: Patient is well-developed, well-nourished in no acute distress.  Resting comfortably at home.  Head is normocephalic, atraumatic.  No labored breathing. Speech is clear and coherent with logical content.  Patient is alert and oriented at baseline.   Assessment and Plan: 1. Suspected COVID-19 virus infection - COVID-19 At Home Antigen Test KIT; Use as directed.  Dispense: 1 kit; Refill: 0 - benzonatate (TESSALON) 100 MG capsule; Take 1 capsule (100 mg total) by mouth 3  (three) times daily as needed for cough.  Dispense: 30 capsule; Refill: 0  Concern for COVID giving classic symptoms. Order for home COVID test placed. She is to take ASAP and message back with the results for further management. Supportive measures, OTC medications and vitamin recommendations reviewed. Tessalon per orders. Patient will be enrolled in [place] monitoring program if testing positive.   Addendum: COVID test positive. Discussed quarantine. Discussed antivirals. She is candidate for Paxlovid. Rx sent to pharmacy . Work note provided.   Follow Up Instructions: I discussed the assessment and treatment plan with the patient. The patient was provided an opportunity to ask questions and all were answered. The patient agreed with the plan and demonstrated an understanding of the instructions.  A copy of instructions were sent to the patient via MyChart unless otherwise noted below.   The patient was advised to call back or seek an in-person evaluation if the symptoms worsen or if the condition fails to improve as anticipated.  Time:  I spent 10 minutes with the patient  via telehealth technology discussing the above problems/concerns.    Leeanne Rio, PA-C

## 2021-11-23 DIAGNOSIS — M5412 Radiculopathy, cervical region: Secondary | ICD-10-CM | POA: Diagnosis not present

## 2021-12-18 ENCOUNTER — Ambulatory Visit: Payer: Federal, State, Local not specified - PPO | Admitting: Internal Medicine

## 2022-01-24 ENCOUNTER — Other Ambulatory Visit: Payer: Self-pay

## 2022-01-24 DIAGNOSIS — R5382 Chronic fatigue, unspecified: Secondary | ICD-10-CM | POA: Diagnosis not present

## 2022-01-24 DIAGNOSIS — F5105 Insomnia due to other mental disorder: Secondary | ICD-10-CM | POA: Diagnosis not present

## 2022-01-24 DIAGNOSIS — F321 Major depressive disorder, single episode, moderate: Secondary | ICD-10-CM | POA: Diagnosis not present

## 2022-01-24 DIAGNOSIS — F411 Generalized anxiety disorder: Secondary | ICD-10-CM | POA: Diagnosis not present

## 2022-01-24 MED ORDER — AMPHETAMINE-DEXTROAMPHET ER 20 MG PO CP24
ORAL_CAPSULE | Freq: Every day | ORAL | 0 refills | Status: DC
  Filled 2022-01-24: qty 30, 30d supply, fill #0

## 2022-01-24 MED ORDER — FLUOXETINE HCL 60 MG PO TABS
ORAL_TABLET | ORAL | 0 refills | Status: DC
  Filled 2022-01-24: qty 90, 90d supply, fill #0

## 2022-01-24 MED ORDER — VENLAFAXINE HCL ER 75 MG PO CP24
ORAL_CAPSULE | Freq: Every day | ORAL | 0 refills | Status: DC
  Filled 2022-01-24: qty 90, 90d supply, fill #0

## 2022-01-24 MED ORDER — AMPHETAMINE-DEXTROAMPHET ER 20 MG PO CP24: ORAL_CAPSULE | Freq: Every day | ORAL | 0 refills | Status: DC

## 2022-01-25 ENCOUNTER — Other Ambulatory Visit: Payer: Self-pay

## 2022-01-30 ENCOUNTER — Encounter: Payer: Self-pay | Admitting: Nurse Practitioner

## 2022-01-30 ENCOUNTER — Other Ambulatory Visit: Payer: Self-pay | Admitting: Nurse Practitioner

## 2022-01-30 DIAGNOSIS — E039 Hypothyroidism, unspecified: Secondary | ICD-10-CM

## 2022-01-30 NOTE — Telephone Encounter (Signed)
Requested Prescriptions  Pending Prescriptions Disp Refills  . NP THYROID 120 MG tablet [Pharmacy Med Name: THYROID NP 2GR ('120MG'$ ) TABLETS] 90 tablet 1    Sig: TAKE 1 TABLET BY MOUTH EVERY MORNING WITH BREAKFAST     Endocrinology:  Hypothyroid Agents Passed - 01/30/2022  3:35 AM      Passed - TSH in normal range and within 360 days    TSH  Date Value Ref Range Status  08/20/2021 1.34 mIU/L Final    Comment:              Reference Range .           > or = 20 Years  0.40-4.50 .                Pregnancy Ranges           First trimester    0.26-2.66           Second trimester   0.55-2.73           Third trimester    0.43-2.91          Passed - Valid encounter within last 12 months    Recent Outpatient Visits          4 months ago Acute diffuse otitis externa of right ear   [facility], FNP   5 months ago Annual physical exam   [facility] Bo Merino, FNP   7 months ago Acute cystitis with hematuria   [facility] Bo Merino, FNP   12 months ago Acute non-recurrent pansinusitis   [facility] Bo Merino, FNP   1 year ago Viral upper respiratory tract infection   [facility] Bo Merino, [place]

## 2022-02-01 DIAGNOSIS — M5412 Radiculopathy, cervical region: Secondary | ICD-10-CM | POA: Diagnosis not present

## 2022-02-01 DIAGNOSIS — Z6832 Body mass index (BMI) 32.0-32.9, adult: Secondary | ICD-10-CM | POA: Diagnosis not present

## 2022-02-04 ENCOUNTER — Encounter: Payer: Self-pay | Admitting: Nurse Practitioner

## 2022-02-05 ENCOUNTER — Other Ambulatory Visit: Payer: Self-pay

## 2022-02-05 ENCOUNTER — Other Ambulatory Visit: Payer: Self-pay | Admitting: Nurse Practitioner

## 2022-02-05 DIAGNOSIS — E039 Hypothyroidism, unspecified: Secondary | ICD-10-CM

## 2022-02-05 MED ORDER — THYROID 120 MG PO TABS
120.0000 mg | ORAL_TABLET | Freq: Every day | ORAL | 1 refills | Status: DC
  Filled 2022-02-05: qty 90, 90d supply, fill #0
  Filled 2022-02-06: qty 80, 80d supply, fill #0
  Filled 2022-02-06: qty 10, 10d supply, fill #0
  Filled 2022-04-30: qty 90, 90d supply, fill #1

## 2022-02-06 ENCOUNTER — Other Ambulatory Visit: Payer: Self-pay

## 2022-02-07 ENCOUNTER — Other Ambulatory Visit: Payer: Self-pay

## 2022-02-15 ENCOUNTER — Encounter: Payer: Self-pay | Admitting: Gastroenterology

## 2022-02-18 ENCOUNTER — Other Ambulatory Visit: Payer: Self-pay

## 2022-02-18 MED ORDER — NA SULFATE-K SULFATE-MG SULF 17.5-3.13-1.6 GM/177ML PO SOLN
1.0000 | Freq: Once | ORAL | 0 refills | Status: AC
  Filled 2022-02-18: qty 354, 1d supply, fill #0

## 2022-02-19 ENCOUNTER — Other Ambulatory Visit: Payer: Self-pay

## 2022-02-19 MED ORDER — AMPHETAMINE-DEXTROAMPHET ER 30 MG PO CP24
ORAL_CAPSULE | Freq: Every day | ORAL | 0 refills | Status: DC
  Filled 2022-02-19 – 2022-02-20 (×2): qty 90, 90d supply, fill #0

## 2022-02-20 ENCOUNTER — Other Ambulatory Visit: Payer: Self-pay

## 2022-02-22 ENCOUNTER — Ambulatory Visit: Payer: Federal, State, Local not specified - PPO | Admitting: Anesthesiology

## 2022-02-22 ENCOUNTER — Ambulatory Visit
Admission: RE | Admit: 2022-02-22 | Discharge: 2022-02-22 | Disposition: A | Discharge status: Home / Self Care | Payer: Federal, State, Local not specified - PPO | Attending: Gastroenterology | Admitting: Gastroenterology

## 2022-02-22 ENCOUNTER — Encounter
Admission: RE | Disposition: A | Discharge status: Home / Self Care | Payer: Self-pay | Source: Home / Self Care | Attending: Gastroenterology

## 2022-02-22 ENCOUNTER — Other Ambulatory Visit: Payer: Self-pay

## 2022-02-22 DIAGNOSIS — K640 First degree hemorrhoids: Secondary | ICD-10-CM | POA: Diagnosis not present

## 2022-02-22 DIAGNOSIS — Z86010 Personal history of colonic polyps: Secondary | ICD-10-CM | POA: Diagnosis not present

## 2022-02-22 DIAGNOSIS — K635 Polyp of colon: Secondary | ICD-10-CM | POA: Insufficient documentation

## 2022-02-22 DIAGNOSIS — K219 Gastro-esophageal reflux disease without esophagitis: Secondary | ICD-10-CM | POA: Diagnosis not present

## 2022-02-22 DIAGNOSIS — Z87891 Personal history of nicotine dependence: Secondary | ICD-10-CM | POA: Diagnosis not present

## 2022-02-22 DIAGNOSIS — Z1211 Encounter for screening for malignant neoplasm of colon: Secondary | ICD-10-CM | POA: Insufficient documentation

## 2022-02-22 DIAGNOSIS — E039 Hypothyroidism, unspecified: Secondary | ICD-10-CM | POA: Diagnosis not present

## 2022-02-22 HISTORY — PX: COLONOSCOPY WITH PROPOFOL: SHX5780

## 2022-02-22 HISTORY — PX: POLYPECTOMY: SHX5525

## 2022-02-22 LAB — POCT PREGNANCY, URINE: Preg Test, Ur: NEGATIVE

## 2022-02-22 SURGERY — COLONOSCOPY WITH PROPOFOL
Anesthesia: Monitor Anesthesia Care | Site: Rectum

## 2022-02-22 MED ORDER — SODIUM CHLORIDE 0.9 % IV SOLN: INTRAVENOUS | Status: DC

## 2022-02-22 MED ORDER — LIDOCAINE HCL (CARDIAC) PF 100 MG/5ML IV SOSY
PREFILLED_SYRINGE | INTRAVENOUS | Status: DC | PRN
  Administered 2022-02-22: 60 mg via INTRAVENOUS

## 2022-02-22 MED ORDER — PROPOFOL 10 MG/ML IV BOLUS
INTRAVENOUS | Status: DC | PRN
  Administered 2022-02-22: 20 mg via INTRAVENOUS
  Administered 2022-02-22 (×2): 50 mg via INTRAVENOUS
  Administered 2022-02-22: 100 mg via INTRAVENOUS

## 2022-02-22 MED ORDER — LACTATED RINGERS IV SOLN: INTRAVENOUS | Status: DC

## 2022-02-22 MED ORDER — STERILE WATER FOR IRRIGATION IR SOLN
Status: DC | PRN
  Administered 2022-02-22: 1

## 2022-02-22 SURGICAL SUPPLY — 21 items

## 2022-02-22 NOTE — H&P (Signed)
Lucilla Lame, MD [facility] [address], [place], [place] [address] [phone] Fax : [phone]  Primary Care Physician:  Bo Merino, FNP Primary Gastroenterologist:  Dr. Allen Norris  Pre-Procedure History & Physical: HPI:  Courtney Alvarez is a 46 y.o. female is here for an colonoscopy.   Past Medical History:  Diagnosis Date   Arthritis    Chronic fatigue syndrome    GERD (gastroesophageal reflux disease)    Headache    tension/cluster   Hypothyroidism    Motion sickness    Plantar fasciitis of left foot    PONV (postoperative nausea and vomiting)    Thyroid disease    Vaginal Pap smear, abnormal     Past Surgical History:  Procedure Laterality Date   BLADDER SURGERY     CERVICAL BIOPSY  W/ LOOP ELECTRODE EXCISION     COLONOSCOPY WITH PROPOFOL N/A 01/05/2016   Procedure: COLONOSCOPY WITH PROPOFOL;  Surgeon: Lucilla Lame, MD;  Location: [facility];  Service: Endoscopy;  Laterality: N/A;   FOOT SURGERY Left    POLYPECTOMY  01/05/2016   Procedure: POLYPECTOMY;  Surgeon: Lucilla Lame, MD;  Location: [facility];  Service: Endoscopy;;    Prior to Admission medications   Medication Sig Start Date End Date Taking? Authorizing Provider  Acetaminophen 325 MG CAPS    Yes [provider]  amphetamine-dextroamphetamine (ADDERALL XR) 20 MG 24 hr capsule Take 1 capsule by mouth daily 01/24/22  Yes   lansoprazole (PREVACID) 15 MG capsule  05/11/20  Yes [provider]  Multiple Vitamins-Minerals (MULTIVITAMIN GUMMIES WOMENS) CHEW Chew 2 tablets by mouth daily.    Yes [provider]  Omega-3 1000 MG CAPS Take by mouth.   Yes [provider]  thyroid (NP THYROID) 120 MG tablet Take 1 tablet (120 mg total) by mouth daily with breakfast. 02/05/22  Yes Bo Merino, FNP  venlafaxine XR (EFFEXOR XR) 75 MG 24 hr capsule Take 1 capsule by mouth daily 01/24/22  Yes   amphetamine-dextroamphetamine (ADDERALL XR) 20 MG 24 hr capsule  Take 20 mg by mouth daily. 04/11/21   [provider]  amphetamine-dextroamphetamine (ADDERALL XR) 20 MG 24 hr capsule Take 1 daily 01/24/22     amphetamine-dextroamphetamine (ADDERALL XR) 30 MG 24 hr capsule Take 1 capsule by mouth daily 02/19/22     FLUoxetine HCl 60 MG TABS Take 1 tablet by mouth daily with breakfast 01/24/22       Allergies as of 08/27/2021   (No Known Allergies)    Family History  Problem Relation Age of Onset   Arthritis Mother    COPD Mother    Thyroid disease Mother    Arthritis Father    Hyperlipidemia Father    Cancer Father        polyp removed   Heart disease Father    Hashimoto's thyroiditis Sister    Arthritis Maternal Grandmother    Cancer Maternal Grandmother        colon   Heart disease Maternal Grandmother    Thyroid disease Maternal Grandmother    Alcohol abuse Maternal Grandfather    Arthritis Maternal Grandfather    Arthritis Paternal Grandmother    Depression Paternal Grandmother    Hypertension Paternal Grandmother    Stroke Paternal Grandmother    Arthritis Paternal Grandfather    Heart disease Paternal Grandfather    Hypertension Paternal Grandfather    Breast cancer Other     Social History   Socioeconomic History  Marital status: Married    Spouse name: Annie Main   Number of children: 1   Years of education: Not on file   Highest education level: Associate degree: occupational, Hotel manager, or vocational program  Occupational History   Occupation: RN  Tobacco Use   Smoking status: Former    Types: Cigarettes    Quit date: 06/14/2003    Years since quitting: 18.7   Smokeless tobacco: Never   Tobacco comments:    quit 06/2003  Vaping Use   Vaping Use: Never used  Substance and Sexual Activity   Alcohol use: Yes    Alcohol/week: 0.0 standard drinks of alcohol    Comment: occasionally    Drug use: No   Sexual activity: Yes    Partners: Male    Birth control/protection: I.U.D.    Comment: mirena  Other Topics  Concern   Not on file  Social History Narrative   Not on file   Social Determinants of Health   Financial Resource Strain: Low Risk  (08/20/2021)   Overall Financial Resource Strain (CARDIA)    Difficulty of Paying Living Expenses: Not hard at all  Food Insecurity: No Food Insecurity (08/20/2021)   Hunger Vital Sign    Worried About Running Out of Food in the Last Year: Never true    Ran Out of Food in the Last Year: Never true  Transportation Needs: No Transportation Needs (08/20/2021)   PRAPARE - Hydrologist (Medical): No    Lack of Transportation (Non-Medical): No  Physical Activity: Sufficiently Active (08/20/2021)   Exercise Vital Sign    Days of Exercise per Week: 5 days    Minutes of Exercise per Session: 60 min  Stress: No Stress Concern Present (08/20/2021)   [place]    Feeling of Stress : Only a little  Social Connections: Socially Isolated (08/20/2021)   Social Connection and Isolation Panel [NHANES]    Frequency of Communication with Friends and Family: Once a week    Frequency of Social Gatherings with Friends and Family: Once a week    Attends Religious Services: Never    Marine scientist or Organizations: No    Attends Archivist Meetings: Never    Marital Status: Married  Human resources officer Violence: Not At Risk (08/20/2021)   Humiliation, Afraid, Rape, and Kick questionnaire    Fear of Current or Ex-Partner: No    Emotionally Abused: No    Physically Abused: No    Sexually Abused: No    Review of Systems: See HPI, otherwise negative ROS  Physical Exam: Ht '5\' 6"'$  (1.676 m)   Wt 91.6 kg   BMI 32.60 kg/m  General:   Alert,  pleasant and cooperative in NAD Head:  Normocephalic and atraumatic. Neck:  Supple; no masses or thyromegaly. Lungs:  Clear throughout to auscultation.    Heart:  Regular rate and rhythm. Abdomen:  Soft, nontender and nondistended.  Normal bowel sounds, without guarding, and without rebound.   Neurologic:  Alert and  oriented x4;  grossly normal neurologically.  Impression/Plan: Courtney Alvarez is here for an colonoscopy to be performed for a history of adenomatous polyps on 2018   Risks, benefits, limitations, and alternatives regarding  colonoscopy have been reviewed with the patient.  Questions have been answered.  All parties agreeable.   Lucilla Lame, MD  02/22/2022, 7:23 AM

## 2022-02-22 NOTE — Anesthesia Preprocedure Evaluation (Addendum)
Anesthesia Evaluation  Patient identified by MRN, date of birth, ID band Patient awake    Reviewed: Allergy & Precautions, H&P , NPO status , Patient's Chart, lab work & pertinent test results  History of Anesthesia Complications (+) PONV and history of anesthetic complications  Airway Mallampati: II  TM Distance: >3 FB Neck ROM: full    Dental no notable dental hx. (+) Chipped   Pulmonary neg pulmonary ROS, neg sleep apnea, neg COPD, former smoker   Pulmonary exam normal        Cardiovascular Exercise Tolerance: Good (-) angina (-) Past MI and (-) CABG negative cardio ROS Normal cardiovascular exam     Neuro/Psych  Headaches PSYCHIATRIC DISORDERS      Chronic fatigue negative neurological ROS  negative psych ROS   GI/Hepatic negative GI ROS, Neg liver ROS,GERD  Controlled,,  Endo/Other  negative endocrine ROSHypothyroidism    Renal/GU negative Renal ROS  negative genitourinary   Musculoskeletal   Abdominal   Peds  Hematology negative hematology ROS (+)   Anesthesia Other Findings Past Medical History: No date: Arthritis No date: Chronic fatigue syndrome No date: GERD (gastroesophageal reflux disease) No date: Headache     Comment:  tension/cluster No date: Hypothyroidism No date: Motion sickness No date: Plantar fasciitis of left foot No date: PONV (postoperative nausea and vomiting) No date: Thyroid disease No date: Vaginal Pap smear, abnormal  Past Surgical History: No date: BLADDER SURGERY No date: CERVICAL BIOPSY  W/ LOOP ELECTRODE EXCISION 01/05/2016: COLONOSCOPY WITH PROPOFOL; N/A     Comment:  Procedure: COLONOSCOPY WITH PROPOFOL;  Surgeon: Lucilla Lame, MD;  Location: [facility];  Service:               Endoscopy;  Laterality: N/A; No date: FOOT SURGERY; Left 01/05/2016: POLYPECTOMY     Comment:  Procedure: POLYPECTOMY;  Surgeon: Lucilla Lame, MD;                Location:  [facility];  Service: Endoscopy;;  BMI    Body Mass Index: 32.12 kg/m      Reproductive/Obstetrics negative OB ROS                             Anesthesia Physical Anesthesia Plan  ASA: II  Anesthesia Plan: MAC   Post-op Pain Management: Minimal or no pain anticipated   Induction: Intravenous  PONV Risk Score and Plan: 3 and Propofol infusion, TIVA and Ondansetron  Airway Management Planned: Nasal Cannula  Additional Equipment: None  Intra-op Plan:   Post-operative Plan:   Informed Consent: I have reviewed the patients History and Physical, chart, labs and discussed the procedure including the risks, benefits and alternatives for the proposed anesthesia with the patient or authorized representative who has indicated his/her understanding and acceptance.     Dental advisory given  Plan Discussed with: CRNA  Anesthesia Plan Comments: (Discussed risks of anesthesia with patient, including possibility of difficulty with spontaneous ventilation under anesthesia necessitating airway intervention, PONV, and rare risks such as cardiac or respiratory or neurological events, and allergic reactions. Discussed the role of CRNA in patient's perioperative care. Patient understands.)        Anesthesia Quick Evaluation

## 2022-02-22 NOTE — Anesthesia Postprocedure Evaluation (Signed)
Anesthesia Post Note  Patient: Courtney Alvarez  Procedure(s) Performed: COLONOSCOPY WITH PROPOFOL (Rectum) POLYPECTOMY (Rectum)  Patient location during evaluation: PACU Anesthesia Type: MAC Level of consciousness: awake and alert Pain management: pain level controlled Vital Signs Assessment: post-procedure vital signs reviewed and stable Respiratory status: spontaneous breathing, nonlabored ventilation, respiratory function stable and patient connected to nasal cannula oxygen Cardiovascular status: blood pressure returned to baseline and stable Postop Assessment: no apparent nausea or vomiting Anesthetic complications: no  No notable events documented.   Last Vitals:  Vitals:   02/22/22 0814 02/22/22 0822  BP: 111/74 117/71  Pulse: 75 73  Resp: 12 17  Temp: 36.7 C 36.7 C  SpO2: 98% 99%    Last Pain:  Vitals:   02/22/22 0822  TempSrc:   PainSc: 0-No pain                 Dimas Millin

## 2022-02-22 NOTE — Op Note (Signed)
[facility] Gastroenterology Patient Name: Courtney Alvarez Procedure Date: 02/22/2022 7:49 AM MRN: [id number] Account #: [id number] Date of Birth: Jan 07, 1976 Admit Type: Outpatient Age: 46 Room: [facility] OR ROOM 01 Gender: Female Note Status: Finalized Instrument Name: [id number] Procedure:             Colonoscopy Indications:           High risk colon cancer surveillance: Personal history                         of colonic polyps Providers:             Lucilla Lame MD, MD Referring MD:          Myna Hidalgo. Pender (Referring MD) Medicines:             Propofol per Anesthesia Complications:         No immediate complications. Procedure:             Pre-Anesthesia Assessment:                        - Prior to the procedure, a History and Physical was                         performed, and patient medications and allergies were                         reviewed. The patient's tolerance of previous                         anesthesia was also reviewed. The risks and benefits                         of the procedure and the sedation options and risks                         were discussed with the patient. All questions were                         answered, and informed consent was obtained. Prior                         Anticoagulants: The patient has taken no anticoagulant                         or antiplatelet agents. ASA Grade Assessment: II - A                         patient with mild systemic disease. After reviewing                         the risks and benefits, the patient was deemed in                         satisfactory condition to undergo the procedure.                        After obtaining informed consent, the colonoscope was  passed under direct vision. Throughout the procedure,                         the patient's blood pressure, pulse, and oxygen                         saturations were monitored continuously. The was                          introduced through the anus and advanced to the the                         cecum, identified by appendiceal orifice and ileocecal                         valve. The colonoscopy was performed without                         difficulty. The patient tolerated the procedure well.                         The quality of the bowel preparation was excellent. Findings:      The perianal and digital rectal examinations were normal.      A 3 mm polyp was found in the cecum. The polyp was sessile. The polyp       was removed with a cold biopsy forceps. Resection and retrieval were       complete.      Non-bleeding internal hemorrhoids were found during retroflexion. The       hemorrhoids were Grade I (internal hemorrhoids that do not prolapse). Impression:            - One 3 mm polyp in the cecum, removed with a cold                         biopsy forceps. Resected and retrieved.                        - Non-bleeding internal hemorrhoids. Recommendation:        - Discharge patient to home.                        - Resume previous diet.                        - Continue present medications.                        - Await pathology results.                        - If the pathology report reveals adenomatous tissue,                         then repeat the colonoscopy for surveillance in 7                         years. Procedure Code(s):     --- Professional ---  45380, Colonoscopy, flexible; with biopsy, single or                         multiple Diagnosis Code(s):     --- Professional ---                        Z86.010, Personal history of colonic polyps                        D12.0, Benign neoplasm of cecum CPT copyright 2022 American Medical Association. All rights reserved. The codes documented in this report are preliminary and upon coder review may  be revised to meet current compliance requirements. Lucilla Lame MD, MD 02/22/2022 8:12:14 AM This report has been  signed electronically. Number of Addenda: 0 Note Initiated On: 02/22/2022 7:49 AM Scope Withdrawal Time: 0 hours 7 minutes 54 seconds  Total Procedure Duration: 0 hours 12 minutes 18 seconds  Estimated Blood Loss:  Estimated blood loss: none.      [facility]

## 2022-02-22 NOTE — Transfer of Care (Signed)
Immediate Anesthesia Transfer of Care Note  Patient: Courtney Alvarez  Procedure(s) Performed: COLONOSCOPY WITH PROPOFOL (Rectum) POLYPECTOMY (Rectum)  Patient Location: PACU  Anesthesia Type: No value filed.  Level of Consciousness: awake, alert  and patient cooperative  Airway and Oxygen Therapy: Patient Spontanous Breathing and Patient connected to supplemental oxygen  Post-op Assessment: Post-op Vital signs reviewed, Patient's Cardiovascular Status Stable, Respiratory Function Stable, Patent Airway and No signs of Nausea or vomiting  Post-op Vital Signs: Reviewed and stable  Complications: No notable events documented.

## 2022-02-25 ENCOUNTER — Encounter: Payer: Self-pay | Admitting: Gastroenterology

## 2022-02-25 LAB — SURGICAL PATHOLOGY

## 2022-04-16 ENCOUNTER — Encounter: Payer: Self-pay | Admitting: Nurse Practitioner

## 2022-04-16 ENCOUNTER — Other Ambulatory Visit: Payer: Self-pay

## 2022-04-16 ENCOUNTER — Ambulatory Visit: Payer: Federal, State, Local not specified - PPO | Admitting: Nurse Practitioner

## 2022-04-16 VITALS — BP 120/80 | HR 97 | Temp 98.1°F | Resp 16 | Ht 66.0 in | Wt 202.5 lb

## 2022-04-16 DIAGNOSIS — L309 Dermatitis, unspecified: Secondary | ICD-10-CM | POA: Diagnosis not present

## 2022-04-16 MED ORDER — TRIAMCINOLONE ACETONIDE 0.5 % EX OINT: 1.0000 | TOPICAL_OINTMENT | Freq: Two times a day (BID) | CUTANEOUS | 0 refills | Status: DC

## 2022-04-16 NOTE — Progress Notes (Signed)
BP 120/80   Pulse 97   Temp 98.1 F (36.7 C) (Oral)   Resp 16   Ht '5\' 6"'$  (1.676 m)   Wt 202 lb 8 oz (91.9 kg)   SpO2 98%   BMI 32.68 kg/m    Subjective:    Patient ID: Courtney Alvarez, female    DOB: 1975/09/25, 47 y.o.   MRN: [id number]  HPI: Courtney Alvarez is a 47 y.o. female  Chief Complaint  Patient presents with   Rash    Dermatitis  flare on arms. Red, itchy, burning for 2 weeks   Eczema:  she says she has had eczema for years and was seen by dermatology. She says she has been doing the cerave, loratadine, hydrocortisone.  She says she is going to try and get an appointment with her dermatologist but it can take some time to get in there and her dermatitis is very pruritic right now. Will send in triamcinolone ointment.  Continue to use cerave and loratadine.   Relevant past medical, surgical, family and social history reviewed and updated as indicated. Interim medical history since our last visit reviewed. Allergies and medications reviewed and updated.  Review of Systems  Constitutional: Negative for fever or weight change.  Respiratory: Negative for cough and shortness of breath.   Cardiovascular: Negative for chest pain or palpitations.  Gastrointestinal: Negative for abdominal pain, no bowel changes.  Musculoskeletal: Negative for gait problem or joint swelling.  Skin: positive for rash.  Neurological: Negative for dizziness or headache.  No other specific complaints in a complete review of systems (except as listed in HPI above).      Objective:    BP 120/80   Pulse 97   Temp 98.1 F (36.7 C) (Oral)   Resp 16   Ht '5\' 6"'$  (1.676 m)   Wt 202 lb 8 oz (91.9 kg)   SpO2 98%   BMI 32.68 kg/m   Wt Readings from Last 3 Encounters:  04/16/22 202 lb 8 oz (91.9 kg)  02/22/22 199 lb (90.3 kg)  09/21/21 196 lb 9.6 oz (89.2 kg)    Physical Exam  Constitutional: Patient appears well-developed and well-nourished. Obese  No distress.  HEENT: head atraumatic,  normocephalic, pupils equal and reactive to light, neck supple Cardiovascular: Normal rate, regular rhythm and normal heart sounds.  No murmur heard. No BLE edema. Pulmonary/Chest: Effort normal and breath sounds normal. No respiratory distress. Abdominal: Soft.  There is no tenderness. Skin: rash on right lower arm Psychiatric: Patient has a normal mood and affect. behavior is normal. Judgment and thought content normal.  Results for orders placed or performed during the hospital encounter of 02/22/22  Pregnancy, urine POC  Result Value Ref Range   Preg Test, Ur NEGATIVE NEGATIVE  Surgical pathology  Result Value Ref Range   SURGICAL PATHOLOGY      SURGICAL PATHOLOGY CASE: [id number] PATIENT: Courtney Alvarez Surgical Pathology Report     Specimen Submitted: A. Colon polyp, cecum; cbx  Clinical History: Personal hx of colon polyps. Cecal polyps    DIAGNOSIS: A. COLON POLYP, CECUM; COLD BIOPSY: - POLYPOID FRAGMENT OF BENIGN COLONIC MUCOSA WITH SUPERFICIAL REACTIVE CHANGES AND PROMINENT SUBMUCOSAL LYMPHOID AGGREGATE. - NEGATIVE FOR DYSPLASIA AND MALIGNANCY.  GROSS DESCRIPTION: A. Labeled: Cecal polyp cold biopsy Received: Formalin Collection time: 7:45 AM on 02/22/2022 Placed into formalin time: 7:45 AM on 02/22/2022 Tissue fragment(s): 1 Size: 0.6 x 0.3 x 0.1 cm Description: Tan soft tissue fragment Entirely submitted in 1  cassette.  RB 02/22/2022  Final Diagnosis performed by Allena Napoleon, MD.   Electronically signed 02/25/2022 9:33:02AM The electronic signature indicates that the named Attending Pathologist has evaluated the specimen Technical component performed at [facility], [address] [place], [place] [address] Lab: [phone] Dir: Rush Farmer, MD, MMM  Professional component performed at [facility], [facility], [place], [place], [place] [address] Lab: [phone] Dir: Kathi Simpers, MD       Assessment & Plan:   Problem  List Items Addressed This Visit   None Visit Diagnoses     Eczema, unspecified type    -  Primary   continue cerave, and loratadine.  start triamcinolone ointment. follow up with dermatology   Relevant Medications   triamcinolone ointment (KENALOG) 0.5 %        Follow up plan: Return if symptoms worsen or fail to improve.

## 2022-04-25 ENCOUNTER — Other Ambulatory Visit: Payer: Self-pay

## 2022-04-25 DIAGNOSIS — F411 Generalized anxiety disorder: Secondary | ICD-10-CM | POA: Diagnosis not present

## 2022-04-25 DIAGNOSIS — F321 Major depressive disorder, single episode, moderate: Secondary | ICD-10-CM | POA: Diagnosis not present

## 2022-04-25 MED ORDER — VENLAFAXINE HCL ER 75 MG PO CP24
75.0000 mg | ORAL_CAPSULE | Freq: Every day | ORAL | 0 refills | Status: DC
  Filled 2022-04-25: qty 90, 90d supply, fill #0

## 2022-04-25 MED ORDER — FLUOXETINE HCL 60 MG PO TABS
60.0000 mg | ORAL_TABLET | Freq: Every day | ORAL | 0 refills | Status: DC
  Filled 2022-04-25: qty 90, 90d supply, fill #0

## 2022-04-25 MED ORDER — AMPHETAMINE-DEXTROAMPHET ER 30 MG PO CP24
30.0000 mg | ORAL_CAPSULE | Freq: Every day | ORAL | 0 refills | Status: DC
  Filled 2022-04-25: qty 90, 90d supply, fill #0

## 2022-04-26 ENCOUNTER — Other Ambulatory Visit: Payer: Self-pay

## 2022-05-03 ENCOUNTER — Other Ambulatory Visit: Payer: Self-pay

## 2022-07-19 ENCOUNTER — Other Ambulatory Visit: Payer: Self-pay

## 2022-07-19 DIAGNOSIS — F411 Generalized anxiety disorder: Secondary | ICD-10-CM | POA: Diagnosis not present

## 2022-07-19 DIAGNOSIS — F321 Major depressive disorder, single episode, moderate: Secondary | ICD-10-CM | POA: Diagnosis not present

## 2022-07-19 MED ORDER — VENLAFAXINE HCL ER 75 MG PO CP24
75.0000 mg | ORAL_CAPSULE | Freq: Every day | ORAL | 0 refills | Status: DC
  Filled 2022-07-19 (×2): qty 90, 90d supply, fill #0

## 2022-07-19 MED ORDER — AMPHETAMINE-DEXTROAMPHET ER 30 MG PO CP24
30.0000 mg | ORAL_CAPSULE | Freq: Every day | ORAL | 0 refills | Status: DC
  Filled 2022-08-05: qty 90, 90d supply, fill #0

## 2022-07-19 MED ORDER — FLUOXETINE HCL 60 MG PO TABS
1.0000 | ORAL_TABLET | Freq: Every day | ORAL | 0 refills | Status: DC
  Filled 2022-07-19: qty 90, 90d supply, fill #0

## 2022-07-22 ENCOUNTER — Other Ambulatory Visit: Payer: Self-pay

## 2022-07-22 DIAGNOSIS — Z6831 Body mass index (BMI) 31.0-31.9, adult: Secondary | ICD-10-CM | POA: Diagnosis not present

## 2022-07-22 DIAGNOSIS — M5412 Radiculopathy, cervical region: Secondary | ICD-10-CM | POA: Diagnosis not present

## 2022-07-22 MED ORDER — PREGABALIN 25 MG PO CAPS
25.0000 mg | ORAL_CAPSULE | Freq: Two times a day (BID) | ORAL | 2 refills | Status: DC
  Filled 2022-07-22: qty 60, 30d supply, fill #0

## 2022-07-24 ENCOUNTER — Encounter: Payer: Self-pay | Admitting: Nurse Practitioner

## 2022-07-29 ENCOUNTER — Other Ambulatory Visit: Payer: Self-pay | Admitting: Surgery

## 2022-07-29 DIAGNOSIS — M5412 Radiculopathy, cervical region: Secondary | ICD-10-CM

## 2022-08-01 ENCOUNTER — Ambulatory Visit
Admission: RE | Admit: 2022-08-01 | Discharge: 2022-08-01 | Disposition: A | Discharge status: Home / Self Care | Payer: Federal, State, Local not specified - PPO | Source: Ambulatory Visit | Attending: Surgery | Admitting: Surgery

## 2022-08-01 DIAGNOSIS — M542 Cervicalgia: Secondary | ICD-10-CM | POA: Diagnosis not present

## 2022-08-01 DIAGNOSIS — M5412 Radiculopathy, cervical region: Secondary | ICD-10-CM | POA: Insufficient documentation

## 2022-08-05 ENCOUNTER — Other Ambulatory Visit: Payer: Self-pay | Admitting: Nurse Practitioner

## 2022-08-05 ENCOUNTER — Other Ambulatory Visit: Payer: Self-pay

## 2022-08-05 DIAGNOSIS — E039 Hypothyroidism, unspecified: Secondary | ICD-10-CM

## 2022-08-06 ENCOUNTER — Other Ambulatory Visit: Payer: Self-pay

## 2022-08-06 MED ORDER — THYROID 120 MG PO TABS
120.0000 mg | ORAL_TABLET | Freq: Every day | ORAL | 0 refills | Status: DC
  Filled 2022-08-06: qty 90, 90d supply, fill #0

## 2022-08-06 NOTE — Telephone Encounter (Signed)
Requested Prescriptions  Pending Prescriptions Disp Refills   thyroid (NP THYROID) 120 MG tablet 90 tablet 0    Sig: Take 1 tablet (120 mg total) by mouth daily with breakfast.     Endocrinology:  Hypothyroid Agents Passed - 08/05/2022  7:37 AM      Passed - TSH in normal range and within 360 days    TSH  Date Value Ref Range Status  08/20/2021 1.34 mIU/L Final    Comment:              Reference Range .           > or = 20 Years  0.40-4.50 .                Pregnancy Ranges           First trimester    0.26-2.66           Second trimester   0.55-2.73           Third trimester    0.43-2.91          Passed - Valid encounter within last 12 months    Recent Outpatient Visits           3 months ago Eczema, unspecified type   [facility] Berniece Salines, FNP   10 months ago Acute diffuse otitis externa of right ear   [facility] Berniece Salines, FNP   11 months ago Annual physical exam   [facility] Berniece Salines, FNP   1 year ago Acute cystitis with hematuria   [facility] Berniece Salines, FNP   1 year ago Acute non-recurrent pansinusitis   [facility] Berniece Salines, FNP

## 2022-08-21 DIAGNOSIS — M5412 Radiculopathy, cervical region: Secondary | ICD-10-CM | POA: Diagnosis not present

## 2022-08-21 DIAGNOSIS — M542 Cervicalgia: Secondary | ICD-10-CM | POA: Diagnosis not present

## 2022-08-28 NOTE — Progress Notes (Unsigned)
   There were no vitals taken for this visit.   Subjective:    Patient ID: MAISEE KANAK, female    DOB: 19-Jan-1976, 47 y.o.   MRN: [id number]  HPI: REBECKAH IMGRUND is a 47 y.o. female  No chief complaint on file.   Relevant past medical, surgical, family and social history reviewed and updated as indicated. Interim medical history since our last visit reviewed. Allergies and medications reviewed and updated.  Review of Systems  Constitutional: Negative for fever or weight change.  Respiratory: Negative for cough and shortness of breath.   Cardiovascular: Negative for chest pain or palpitations.  Gastrointestinal: Negative for abdominal pain, no bowel changes.  Musculoskeletal: Negative for gait problem or joint swelling.  Skin: Negative for rash.  Neurological: Negative for dizziness or headache.  No other specific complaints in a complete review of systems (except as listed in HPI above).      Objective:    There were no vitals taken for this visit.  Wt Readings from Last 3 Encounters:  04/16/22 202 lb 8 oz (91.9 kg)  02/22/22 199 lb (90.3 kg)  09/21/21 196 lb 9.6 oz (89.2 kg)    Physical Exam  Constitutional: Patient appears well-developed and well-nourished. Obese *** No distress.  HEENT: head atraumatic, normocephalic, pupils equal and reactive to light, ears ***, neck supple, throat within normal limits Cardiovascular: Normal rate, regular rhythm and normal heart sounds.  No murmur heard. No BLE edema. Pulmonary/Chest: Effort normal and breath sounds normal. No respiratory distress. Abdominal: Soft.  There is no tenderness. Psychiatric: Patient has a normal mood and affect. behavior is normal. Judgment and thought content normal.  Results for orders placed or performed during the hospital encounter of 02/22/22  Pregnancy, urine POC  Result Value Ref Range   Preg Test, Ur NEGATIVE NEGATIVE  Surgical pathology  Result Value Ref Range   SURGICAL PATHOLOGY       SURGICAL PATHOLOGY CASE: [id number] PATIENT: Reagyn Monteforte Surgical Pathology Report     Specimen Submitted: A. Colon polyp, cecum; cbx  Clinical History: Personal hx of colon polyps. Cecal polyps    DIAGNOSIS: A. COLON POLYP, CECUM; COLD BIOPSY: - POLYPOID FRAGMENT OF BENIGN COLONIC MUCOSA WITH SUPERFICIAL REACTIVE CHANGES AND PROMINENT SUBMUCOSAL LYMPHOID AGGREGATE. - NEGATIVE FOR DYSPLASIA AND MALIGNANCY.  GROSS DESCRIPTION: A. Labeled: Cecal polyp cold biopsy Received: Formalin Collection time: 7:45 AM on 02/22/2022 Placed into formalin time: 7:45 AM on 02/22/2022 Tissue fragment(s): 1 Size: 0.6 x 0.3 x 0.1 cm Description: Tan soft tissue fragment Entirely submitted in 1 cassette.  RB 02/22/2022  Final Diagnosis performed by Katherine Mantle, MD.   Electronically signed 02/25/2022 9:33:02AM The electronic signature indicates that the named Attending Pathologist has evaluated the specimen Technical component performed at [facility], [address] [place], [place] [address] Lab: [phone] Dir: Jolene Schimke, MD, MMM  Professional component performed at [facility], [facility], [address] [place], [place], [place] [address] Lab: [phone] Dir: Beryle Quant, MD       Assessment & Plan:   Problem List Items Addressed This Visit   None    Follow up plan: No follow-ups on file.

## 2022-08-29 ENCOUNTER — Encounter: Payer: Self-pay | Admitting: Nurse Practitioner

## 2022-08-29 ENCOUNTER — Other Ambulatory Visit: Payer: Self-pay

## 2022-08-29 ENCOUNTER — Ambulatory Visit: Payer: Federal, State, Local not specified - PPO | Admitting: Nurse Practitioner

## 2022-08-29 VITALS — BP 120/72 | HR 96 | Temp 98.0°F | Resp 16 | Ht 66.0 in | Wt 197.0 lb

## 2022-08-29 DIAGNOSIS — T3695XA Adverse effect of unspecified systemic antibiotic, initial encounter: Secondary | ICD-10-CM

## 2022-08-29 DIAGNOSIS — B379 Candidiasis, unspecified: Secondary | ICD-10-CM

## 2022-08-29 DIAGNOSIS — J0141 Acute recurrent pansinusitis: Secondary | ICD-10-CM

## 2022-08-29 MED ORDER — AMOXICILLIN-POT CLAVULANATE 875-125 MG PO TABS
1.0000 | ORAL_TABLET | Freq: Two times a day (BID) | ORAL | 0 refills | Status: DC
  Filled 2022-08-29: qty 20, 10d supply, fill #0

## 2022-08-29 MED ORDER — FLUCONAZOLE 150 MG PO TABS
150.0000 mg | ORAL_TABLET | ORAL | 0 refills | Status: DC | PRN
  Filled 2022-08-29: qty 2, 6d supply, fill #0

## 2022-08-29 NOTE — Assessment & Plan Note (Signed)
Recommend taking zyrtec, flonase, mucinex. Push fluids and get rest. start augmentin.

## 2022-09-17 ENCOUNTER — Telehealth: Payer: Self-pay

## 2022-09-17 NOTE — Telephone Encounter (Signed)
-----   Message from Tyrell Antonio, MD sent at 09/16/2022  4:12 PM EDT ----- Ok bilateral UE emg/ncs  ----- Message ----- From: Sharlet Salina, CMA Sent: 09/16/2022   3:51 PM EDT To: Tyrell Antonio, MD; Juanda Chance, NP   ----- Message ----- From: Dorcas Mcmurray Sent: 09/11/2022   2:37 PM EDT To: Sharlet Salina, CMA

## 2022-09-17 NOTE — Telephone Encounter (Signed)
LVM to return call to schedule NCV  

## 2022-09-20 ENCOUNTER — Telehealth: Payer: Self-pay

## 2022-09-20 NOTE — Telephone Encounter (Signed)
Spoke with patient and scheduled NCV for 09/27/22.

## 2022-09-20 NOTE — Telephone Encounter (Signed)
-----   Message from Frederic Newton, MD sent at 09/16/2022  4:12 PM EDT ----- Ok bilateral UE emg/ncs  ----- Message ----- From: Willie Plain H, CMA Sent: 09/16/2022   3:51 PM EDT To: Frederic Newton, MD; Megan E Williams, NP   ----- Message ----- From: Hughes, Tammy Sent: 09/11/2022   2:37 PM EDT To: Gerrod Maule H Millard Bautch, CMA    

## 2022-09-27 ENCOUNTER — Ambulatory Visit (INDEPENDENT_AMBULATORY_CARE_PROVIDER_SITE_OTHER): Payer: Federal, State, Local not specified - PPO | Admitting: Physical Medicine and Rehabilitation

## 2022-09-27 DIAGNOSIS — M961 Postlaminectomy syndrome, not elsewhere classified: Secondary | ICD-10-CM

## 2022-09-27 DIAGNOSIS — M542 Cervicalgia: Secondary | ICD-10-CM | POA: Diagnosis not present

## 2022-09-27 DIAGNOSIS — Z981 Arthrodesis status: Secondary | ICD-10-CM

## 2022-09-27 DIAGNOSIS — R202 Paresthesia of skin: Secondary | ICD-10-CM

## 2022-09-27 DIAGNOSIS — M79641 Pain in right hand: Secondary | ICD-10-CM

## 2022-09-27 NOTE — Progress Notes (Unsigned)
Functional Pain Scale - descriptive words and definitions  Distracting (5)    Aware of pain/able to complete some ADL's but limited by pain/sleep is affected and active distractions are only slightly useful. Moderate range order  Average Pain 8-9 when bad  Right handed. Right hand numbness mainly in the middle and ring fingers that can radiate up to the forearm. Pain can shoot up the arm with certain movements. Left hand is starting to go numb

## 2022-10-01 NOTE — Progress Notes (Signed)
Courtney Alvarez - 47 y.o. female MRN [id number]  Date of birth: 09-15-1975  Office Visit Note: Visit Date: 09/27/2022 PCP: Berniece Salines, FNP Referred by: Bedelia Person, MD  Subjective: Chief Complaint  Patient presents with   Left Hand - Numbness   Right Hand - Numbness   HPI:  Courtney Alvarez is a 47 y.o. female who comes in today for evaluation and management at the request of Patrici Ranks, PA-C for chronic and severe and worsening right hand pain and numbness particularly in the middle and ring fingers.  She reports this symptom can shoot up the arm in certain movements and also routinely and fairly constantly radiate up into the forearm.  She reports average pain is 8-9 out of 10 when it is really bad but it can vary.  Function wise she reports a 5 out of 10 on the functional pain scale with some ADLs are limited and she is getting less sleep.  She started to get some symptoms on the left hand as well which is somewhat nondermatomal somewhat more radial digit.  Her history is that she was followed by Dr. Burnard Bunting in our office with neck pain and what was felt to be radicular arm pain into the hand on the right.  MRI from 2022 showed mainly spondylitic changes with foraminal narrowing and some narrowing and flattening of the canal but no high-grade central stenosis or focal disc herniations.  We had actually completed epidural injection and electrodiagnostic study at that time.  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  As of note sensory only radiculopathy or radiculitis does not show up on electrodiagnostic study.  She went on to have cervical fusion at C4-5 and C5-6.  This was performed by Dr. Hoyt Koch in May 2023.  Postsurgery she feels like she did get some relief of pain complaints but continues to have this numbness at times in the right hand.  Repeat cervical MRI post surgery  reveals some foraminal narrowing at those levels.  Otherwise canal patency improved.  She has failed all manner of other conservative care including medication management and physical therapy etc.  Her case is complicated by hypothyroidism as well as chronic fatigue syndrome.  She has not noted specific focal weakness but has felt weak at times in the arm and hand.   I spent more than 30 minutes speaking face-to-face with the patient with 50% of the time in counseling and discussing coordination of care.     Review of Systems  Musculoskeletal:  Positive for joint pain and neck pain.  Neurological:  Positive for tingling and weakness.  All other systems reviewed and are negative.  Otherwise per HPI.  Assessment & Plan: Visit Diagnoses:    ICD-10-CM   1. Paresthesia of skin  R20.2 NCV with EMG (electromyography)    Ambulatory referral to Sports Medicine    2. Cervicalgia  M54.2     3. 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 Pain in right hand  M79.641       Plan: Impression: Complicated chronic pain with paresthesias and numbness in the cervical spine and shoulder and arm and hand status post cervical two-level fusion with residual foraminal narrowing but improved pain in the neck and shoulder but with continued symptoms in the hand.  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  She could have some underlying myofascial pain syndrome or central sensitization syndrome.  Electrodiagnostic study performed today.  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   There is no significant electrodiagnostic evidence of any other focal nerve entrapment, brachial plexopathy or cervical radiculopathy.  Normal left median nerve.  As you know, this particular electrodiagnostic study cannot rule out chemical  radiculitis or sensory only radiculopathy.  Recommendations: 1.  Follow-up with referring physician. 2.  Continue current management of symptoms.  Referral placed to Dr. Madelyn Brunner for ultrasound-guided hydrodissection and injection of the carpal tunnel on the right. 3.  Continue use of resting splint at night-time and as needed during the day.  Meds & Orders: No orders of the defined types were placed in this encounter.   Orders Placed This Encounter  Procedures   Ambulatory referral to Sports Medicine   NCV with EMG (electromyography)    Follow-up: Return for Patrici Ranks, PA-C and Hoyt Koch, MD.   Procedures: No procedures performed  EMG & NCV Findings: Evaluation of the left median motor nerve showed reduced amplitude (4.8 mV).  The right median (across palm) sensory nerve showed prolonged distal peak latency (Wrist, 4.9 ms).  All remaining nerves (as indicated in the following tables) were within normal limits.  All left vs. right side differences were within normal limits.    All examined muscles (as indicated in the following table) showed no evidence of electrical instability.    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   There is no significant electrodiagnostic evidence of any other focal nerve entrapment, brachial plexopathy or cervical radiculopathy.  Normal left median nerve.  As you know, this particular electrodiagnostic study cannot rule out chemical radiculitis or sensory only radiculopathy.  Recommendations: 1.  Follow-up with referring physician. 2.  Continue current management of symptoms.  Referral placed to Dr. Madelyn Brunner for ultrasound-guided hydrodissection and injection of the carpal tunnel on the right. 3.  Continue use of resting splint at night-time and as needed during the  day.  ___________________________ Elease Hashimoto Board Certified, American Board of Physical Medicine and Rehabilitation    Nerve Conduction Studies Anti Sensory Summary Table   Stim Site NR Peak (ms) Norm Peak (ms) P-T Amp (V) Norm P-T Amp Site1 Site2 Delta-P (ms) Dist (cm) Vel (m/s) Norm Vel (m/s)  Left Median Acr Palm Anti Sensory (2nd Digit)  32.5C  Wrist    3.3 <3.6 23.5 >10 Wrist Palm 1.5 0.0    Palm    1.8 <2.0 29.9         Right Median Acr Palm Anti Sensory (2nd Digit)  31.6C  Wrist    *4.9 <3.6 13.1 >10 Wrist Palm 3.4 0.0    Palm    1.5 <2.0 16.0         Right Radial Anti Sensory (Base 1st Digit)  31.5C  Wrist    2.0 <3.1 30.8  Wrist Base 1st Digit 2.0 0.0    Right Ulnar Anti Sensory (5th Digit)  31.9C  Wrist    3.0 <3.7 19.4 >15.0 Wrist 5th Digit 3.0 14.0 47 >38   Motor Summary Table   Stim Site NR Onset (ms) Norm Onset (ms) O-P Amp (mV) Norm O-P Amp Site1 Site2  Delta-0 (ms) Dist (cm) Vel (m/s) Norm Vel (m/s)  Left Median Motor (Abd Poll Brev)  32.6C  Wrist    3.2 <4.2 *4.8 >5 Elbow Wrist 3.8 22.0 58 >50  Elbow    7.0  4.8         Right Median Motor (Abd Poll Brev)  31.7C  Wrist    3.8 <4.2 6.8 >5 Elbow Wrist 4.1 22.0 54 >50  Elbow    7.9  6.5         Right Ulnar Motor (Abd Dig Min)  31.8C  Wrist    2.8 <4.2 10.9 >3 B Elbow Wrist 3.3 20.5 62 >53  B Elbow    6.1  10.7  A Elbow B Elbow 1.1 10.0 91 >53  A Elbow    7.2  10.3          EMG   Side Muscle Nerve Root Ins Act Fibs Psw Amp Dur Poly Recrt Int Dennie Bible Comment  Right Abd Poll Brev Median C8-T1 Nml Nml Nml Nml Nml 0 Nml Nml   Right 1stDorInt Ulnar C8-T1 Nml Nml Nml Nml Nml 0 Nml Nml   Right Deltoid Axillary C5-6 Nml Nml Nml Nml Nml 0 Nml Nml   Right Ext Digitorum  Radial (Post Int) C7-8 Nml Nml Nml Nml Nml 0 Nml Nml   Right Triceps Radial C6-7-8 Nml Nml Nml Nml Nml 0 Nml Nml     Nerve Conduction Studies Anti Sensory Left/Right Comparison   Stim Site L Lat (ms) R Lat (ms) L-R Lat (ms) L Amp (V) R  Amp (V) L-R Amp (%) Site1 Site2 L Vel (m/s) R Vel (m/s) L-R Vel (m/s)  Median Acr Palm Anti Sensory (2nd Digit)  32.5C  Wrist 3.3 *4.9 1.6 23.5 13.1 44.3 Wrist Palm     Palm 1.8 1.5 0.3 29.9 16.0 46.5       Radial Anti Sensory (Base 1st Digit)  31.5C  Wrist  2.0   30.8  Wrist Base 1st Digit     Ulnar Anti Sensory (5th Digit)  31.9C  Wrist  3.0   19.4  Wrist 5th Digit  47    Motor Left/Right Comparison   Stim Site L Lat (ms) R Lat (ms) L-R Lat (ms) L Amp (mV) R Amp (mV) L-R Amp (%) Site1 Site2 L Vel (m/s) R Vel (m/s) L-R Vel (m/s)  Median Motor (Abd Poll Brev)  32.6C  Wrist 3.2 3.8 0.6 *4.8 6.8 29.4 Elbow Wrist 58 54 4  Elbow 7.0 7.9 0.9 4.8 6.5 26.2       Ulnar Motor (Abd Dig Min)  31.8C  Wrist  2.8   10.9  B Elbow Wrist  62   B Elbow  6.1   10.7  A Elbow B Elbow  91   A Elbow  7.2   10.3           Waveforms:                 Clinical History: MRI CERVICAL SPINE WITHOUT CONTRAST   TECHNIQUE: Multiplanar, multisequence MR imaging of the cervical spine was performed. No intravenous contrast was administered.   COMPARISON:  07/29/2020   FINDINGS: Alignment: Physiologic.   Vertebrae: No acute fracture, evidence of discitis, or aggressive bone lesion.   Cord: Normal signal and morphology.   Posterior Fossa, vertebral arteries, paraspinal tissues: Posterior fossa demonstrates no focal abnormality. Vertebral artery flow voids are maintained. Paraspinal soft tissues are unremarkable.   Disc levels:   Discs:  Anterior cervical disc fusion at C4-C6. Disc space narrowing at C6-7.   C2-3: No disc protrusion. Mild left foraminal stenosis. Mild left facet arthropathy. No right foraminal stenosis. No spinal stenosis.   C3-4: Mild broad-based disc bulge. Bilateral uncovertebral degenerative changes. Mild bilateral foraminal stenosis. No spinal stenosis.   C4-5: Interbody fusion. Moderate bilateral foraminal stenosis. No spinal stenosis.   C5-6: Interbody fusion.  Moderate right and severe left foraminal stenosis. No spinal stenosis.   C6-7: Mild broad-based disc bulge. Mild bilateral foraminal stenosis. No spinal stenosis.   C7-T1: No significant disc bulge. No neural foraminal stenosis. No central canal stenosis.   IMPRESSION: 1. Anterior cervical disc fusion at C4-C6. At C4-5 there is moderate bilateral foraminal stenosis. At C5-6 there is moderate right and severe left foraminal stenosis. 2. At C3-4 there is a mild broad-based disc bulge. Bilateral uncovertebral degenerative changes. Mild bilateral foraminal stenosis. 3. At C6-7 there is a mild broad-based disc bulge. Mild bilateral foraminal stenosis. 4. No acute osseous injury of the cervical spine.     Electronically Signed   By: Elige Ko M.D.   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 There is no significant electrodiagnostic evidence of any other focal nerve entrapment, brachial plexopathy or cervical radiculopathy.    Recommendations: 1.  Follow-up with referring physician. 2.  Continue current management of symptoms. 3.  Although mild electrically this could represent a source of some of her symptoms.  Consider diagnostic injection or treatment with night splint.   ___________________________ Elease Hashimoto Board Certified, American Board of Physical Medicine and Rehabilitation     Objective:  VS:  HT:    WT:   BMI:     BP:   HR: bpm  TEMP: ( )  RESP:  Physical Exam Vitals and nursing note reviewed.  Constitutional:      General: She is not in acute distress.    Appearance: Normal appearance. She is well-developed. She is not ill-appearing.  HENT:     Head: Normocephalic and atraumatic.  Eyes:     Conjunctiva/sclera:  Conjunctivae normal.     Pupils: Pupils are equal, round, and reactive to light.  Cardiovascular:     Rate and Rhythm: Normal rate.     Pulses: Normal pulses.  Pulmonary:     Effort: Pulmonary effort is normal.  Musculoskeletal:        General: No swelling, tenderness or deformity.     Right lower leg: No edema.     Left lower leg: No edema.     Comments: Inspection reveals no atrophy of the bilateral APB or FDI or hand intrinsics. There is no swelling, color changes, allodynia or dystrophic changes. There is 5 out of 5 strength in the bilateral wrist extension, finger abduction and long finger flexion. There is intact sensation to light touch in all dermatomal and peripheral nerve distributions. There is a negative Tinel's test at the bilateral wrist and elbow. There is a positive Phalen's test on the right. There is a negative Hoffmann's test bilaterally.  Skin:    General: Skin is warm and dry.     Findings: No erythema or rash.  Neurological:     General: No focal deficit present.     Mental Status: She is alert and oriented to person, place, and time.  Cranial Nerves: No cranial nerve deficit.     Sensory: No sensory deficit.     Motor: No weakness or abnormal muscle tone.     Coordination: Coordination normal.     Gait: Gait normal.  Psychiatric:        Mood and Affect: Mood normal.        Behavior: Behavior normal.      Imaging: No results found.

## 2022-10-01 NOTE — Procedures (Signed)
EMG & NCV Findings: Evaluation of the left median motor nerve showed reduced amplitude (4.8 mV).  The right median (across palm) sensory nerve showed prolonged distal peak latency (Wrist, 4.9 ms).  All remaining nerves (as indicated in the following tables) were within normal limits.  All left vs. right side differences were within normal limits.    All examined muscles (as indicated in the following table) showed no evidence of electrical instability.    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   There is no significant electrodiagnostic evidence of any other focal nerve entrapment, brachial plexopathy or cervical radiculopathy.  Normal left median nerve.  As you know, this particular electrodiagnostic study cannot rule out chemical radiculitis or sensory only radiculopathy.  Recommendations: 1.  Follow-up with referring physician. 2.  Continue current management of symptoms.  Referral placed to Dr. Madelyn Brunner for ultrasound-guided hydrodissection and injection of the carpal tunnel on the right. 3.  Continue use of resting splint at night-time and as needed during the day.  ___________________________ Courtney Alvarez Board Certified, American Board of Physical Medicine and Rehabilitation    Nerve Conduction Studies Anti Sensory Summary Table   Stim Site NR Peak (ms) Norm Peak (ms) P-T Amp (V) Norm P-T Amp Site1 Site2 Delta-P (ms) Dist (cm) Vel (m/s) Norm Vel (m/s)  Left Median Acr Palm Anti Sensory (2nd Digit)  32.5C  Wrist    3.3 <3.6 23.5 >10 Wrist Palm 1.5 0.0    Palm    1.8 <2.0 29.9         Right Median Acr Palm Anti Sensory (2nd Digit)  31.6C  Wrist    *4.9 <3.6 13.1 >10 Wrist Palm 3.4 0.0    Palm    1.5 <2.0 16.0         Right Radial Anti Sensory (Base 1st Digit)  31.5C  Wrist    2.0 <3.1 30.8  Wrist Base 1st Digit 2.0 0.0     Right Ulnar Anti Sensory (5th Digit)  31.9C  Wrist    3.0 <3.7 19.4 >15.0 Wrist 5th Digit 3.0 14.0 47 >38   Motor Summary Table   Stim Site NR Onset (ms) Norm Onset (ms) O-P Amp (mV) Norm O-P Amp Site1 Site2 Delta-0 (ms) Dist (cm) Vel (m/s) Norm Vel (m/s)  Left Median Motor (Abd Poll Brev)  32.6C  Wrist    3.2 <4.2 *4.8 >5 Elbow Wrist 3.8 22.0 58 >50  Elbow    7.0  4.8         Right Median Motor (Abd Poll Brev)  31.7C  Wrist    3.8 <4.2 6.8 >5 Elbow Wrist 4.1 22.0 54 >50  Elbow    7.9  6.5         Right Ulnar Motor (Abd Dig Min)  31.8C  Wrist    2.8 <4.2 10.9 >3 B Elbow Wrist 3.3 20.5 62 >53  B Elbow    6.1  10.7  A Elbow B Elbow 1.1 10.0 91 >53  A Elbow    7.2  10.3          EMG   Side Muscle Nerve Root Ins Act Fibs Psw Amp Dur Poly Recrt Int Dennie Bible Comment  Right Abd Poll Brev Median C8-T1 Nml Nml Nml Nml Nml 0 Nml Nml   Right 1stDorInt Ulnar C8-T1 Nml Nml Nml Nml Nml 0 Nml Nml  Right Deltoid Axillary C5-6 Nml Nml Nml Nml Nml 0 Nml Nml   Right Ext Digitorum  Radial (Post Int) C7-8 Nml Nml Nml Nml Nml 0 Nml Nml   Right Triceps Radial C6-7-8 Nml Nml Nml Nml Nml 0 Nml Nml     Nerve Conduction Studies Anti Sensory Left/Right Comparison   Stim Site L Lat (ms) R Lat (ms) L-R Lat (ms) L Amp (V) R Amp (V) L-R Amp (%) Site1 Site2 L Vel (m/s) R Vel (m/s) L-R Vel (m/s)  Median Acr Palm Anti Sensory (2nd Digit)  32.5C  Wrist 3.3 *4.9 1.6 23.5 13.1 44.3 Wrist Palm     Palm 1.8 1.5 0.3 29.9 16.0 46.5       Radial Anti Sensory (Base 1st Digit)  31.5C  Wrist  2.0   30.8  Wrist Base 1st Digit     Ulnar Anti Sensory (5th Digit)  31.9C  Wrist  3.0   19.4  Wrist 5th Digit  47    Motor Left/Right Comparison   Stim Site L Lat (ms) R Lat (ms) L-R Lat (ms) L Amp (mV) R Amp (mV) L-R Amp (%) Site1 Site2 L Vel (m/s) R Vel (m/s) L-R Vel (m/s)  Median Motor (Abd Poll Brev)  32.6C  Wrist 3.2 3.8 0.6 *4.8 6.8 29.4 Elbow Wrist 58 54 4  Elbow 7.0 7.9 0.9 4.8 6.5 26.2       Ulnar Motor (Abd  Dig Min)  31.8C  Wrist  2.8   10.9  B Elbow Wrist  62   B Elbow  6.1   10.7  A Elbow B Elbow  91   A Elbow  7.2   10.3           Waveforms:

## 2022-10-08 ENCOUNTER — Other Ambulatory Visit: Payer: Self-pay

## 2022-10-08 DIAGNOSIS — F411 Generalized anxiety disorder: Secondary | ICD-10-CM | POA: Diagnosis not present

## 2022-10-08 DIAGNOSIS — F321 Major depressive disorder, single episode, moderate: Secondary | ICD-10-CM | POA: Diagnosis not present

## 2022-10-08 MED ORDER — AMPHETAMINE-DEXTROAMPHET ER 30 MG PO CP24
30.0000 mg | ORAL_CAPSULE | Freq: Every day | ORAL | 0 refills | Status: DC
  Filled 2022-10-08: qty 90, 90d supply, fill #0

## 2022-10-08 MED ORDER — VENLAFAXINE HCL ER 75 MG PO CP24
75.0000 mg | ORAL_CAPSULE | Freq: Every day | ORAL | 0 refills | Status: DC
  Filled 2022-10-08: qty 90, 90d supply, fill #0

## 2022-10-08 MED ORDER — FLUOXETINE HCL 60 MG PO TABS
60.0000 mg | ORAL_TABLET | Freq: Every day | ORAL | 0 refills | Status: DC
  Filled 2022-10-08 – 2022-10-09 (×2): qty 90, 90d supply, fill #0

## 2022-10-09 ENCOUNTER — Other Ambulatory Visit: Payer: Self-pay

## 2022-10-10 ENCOUNTER — Ambulatory Visit (INDEPENDENT_AMBULATORY_CARE_PROVIDER_SITE_OTHER): Payer: Federal, State, Local not specified - PPO | Admitting: Sports Medicine

## 2022-10-10 ENCOUNTER — Encounter: Payer: Self-pay | Admitting: Sports Medicine

## 2022-10-10 ENCOUNTER — Other Ambulatory Visit: Payer: Self-pay

## 2022-10-10 DIAGNOSIS — G5601 Carpal tunnel syndrome, right upper limb: Secondary | ICD-10-CM

## 2022-10-10 DIAGNOSIS — M79641 Pain in right hand: Secondary | ICD-10-CM | POA: Diagnosis not present

## 2022-10-10 DIAGNOSIS — Z981 Arthrodesis status: Secondary | ICD-10-CM

## 2022-10-10 DIAGNOSIS — M542 Cervicalgia: Secondary | ICD-10-CM

## 2022-10-10 NOTE — Progress Notes (Signed)
Courtney Alvarez - 47 y.o. female MRN [id number]  Date of birth: 03-25-1976  Office Visit Note: Visit Date: 10/10/2022 PCP: Berniece Salines, FNP Referred by: Tyrell Antonio, MD  Subjective: Chief Complaint  Patient presents with   Right Hand - Pain   HPI: Courtney Alvarez is a pleasant 47 y.o. female who presents today for RUE radiculopathy with NCS-confirmed right carpal tunnel syndrome.  Taiesha has had chronic right upper extremity radicular symptoms.  At times coming from the neck, also down in the wrist and hand.  She gets numbness and tingling in most of her fingers, but most notably in the index and ring finger.  She did recently have nerve conduction study by my partner Dr. Alvester Morin which showed mild right median nerve entrapment at the wrist.  She is also status post anterior cervical disc fusion from C4-C6.  Trying to decipher whether her symptoms are more so from the carpal tunnel themselves or radicular from the neck.  Has tried gabapentin and Lyrica in the past.  Pertinent ROS were reviewed with the patient and found to be negative unless otherwise specified above in HPI.   Assessment & Plan: Visit Diagnoses:  1. Carpal tunnel syndrome, right upper limb   2. Pain in right hand   3. Cervicalgia   4. S/P cervical spinal fusion    Plan: Did review her previous notes and treatment and recent EMG/nerve conduction studies with Dr. Alvester Morin which showed mild median nerve entrapment.  Through shared decision-making we proceeded with ultrasound-guided carpal tunnel and median nerve hydrodissection injection both diagnostic and therapeutic purposes.  She will remain in her cock-up wrist splint at night and during work for the next 72 hours.  I would like her to see at about the 7-10-day mark how much this relieves her symptoms and what areas this does relieve them (I.e. carpal tunnel, cervical radic, postural/brachial plexopathy). Depending on her results, she will follow-up with Dr. August Saucer to  discuss next steps.  We did discuss possibly trying a low-dose of Elavil at bedtime for any radicular symptoms.  She will follow-up with Dr. August Saucer, I am happy to see her as needed.  Follow-up: Return for F/u with Dr. August Saucer as needed depending on relief from injection.   Meds & Orders: No orders of the defined types were placed in this encounter.   Orders Placed This Encounter  Procedures   US Guided Needle Placement - No Linked Charges   [place] Extrem Up Right Ltd    Procedures: Procedure: US-guided Carpal Tunnel Injection, Right Wrist After informed verbal consent and discussion on R/B/I, a timeout was performed, patient was seated on exam table with the affected arm placed in supine position on table. The area overlying the patient's carpal tunnel was prepped with Betadine and alcohol swabs then utilizing ultrasound guidance via an in-plane approach, the patient's carpal tunnel was injected with a mixture of 3:3:1 lidocaine:D5W:betamethasone with hydrodissection of the median nerve from the flexor retinaculum in 360 degree fashion. Visualization of the needle was achieved with a transverse, in-plane approach. Patient tolerated procedure well without immediate complications.       Clinical History: MRI CERVICAL SPINE WITHOUT CONTRAST   TECHNIQUE: Multiplanar, multisequence MR imaging of the cervical spine was performed. No intravenous contrast was administered.   COMPARISON:  07/29/2020   FINDINGS: Alignment: Physiologic.   Vertebrae: No acute fracture, evidence of discitis, or aggressive bone lesion.   Cord: Normal signal and morphology.   Posterior Fossa, vertebral arteries,  paraspinal tissues: Posterior fossa demonstrates no focal abnormality. Vertebral artery flow voids are maintained. Paraspinal soft tissues are unremarkable.   Disc levels:   Discs: Anterior cervical disc fusion at C4-C6. Disc space narrowing at C6-7.   C2-3: No disc protrusion. Mild left foraminal  stenosis. Mild left facet arthropathy. No right foraminal stenosis. No spinal stenosis.   C3-4: Mild broad-based disc bulge. Bilateral uncovertebral degenerative changes. Mild bilateral foraminal stenosis. No spinal stenosis.   C4-5: Interbody fusion. Moderate bilateral foraminal stenosis. No spinal stenosis.   C5-6: Interbody fusion. Moderate right and severe left foraminal stenosis. No spinal stenosis.   C6-7: Mild broad-based disc bulge. Mild bilateral foraminal stenosis. No spinal stenosis.   C7-T1: No significant disc bulge. No neural foraminal stenosis. No central canal stenosis.   IMPRESSION: 1. Anterior cervical disc fusion at C4-C6. At C4-5 there is moderate bilateral foraminal stenosis. At C5-6 there is moderate right and severe left foraminal stenosis. 2. At C3-4 there is a mild broad-based disc bulge. Bilateral uncovertebral degenerative changes. Mild bilateral foraminal stenosis. 3. At C6-7 there is a mild broad-based disc bulge. Mild bilateral foraminal stenosis. 4. No acute osseous injury of the cervical spine.     Electronically Signed   By: Elige Ko M.D.   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 There is no significant electrodiagnostic evidence of any other focal nerve entrapment, brachial plexopathy or cervical radiculopathy.    Recommendations: 1.  Follow-up with referring physician. 2.  Continue current management of symptoms. 3.  Although mild electrically this could represent a source of some of her symptoms.  Consider diagnostic injection or treatment with night splint.   ___________________________ Naaman Plummer [facility] Board Certified, American Board of Physical Medicine and Rehabilitation  She reports that she  quit smoking about 19 years ago. Her smoking use included cigarettes. She has never used smokeless tobacco. No results for input(s): "HGBA1C", "LABURIC" in the last 8760 hours.  Objective:    Physical Exam  Gen: Well-appearing, in no acute distress; non-toxic CV: Well-perfused. Warm.  Resp: Breathing unlabored on room air; no wheezing. Psych: Fluid speech in conversation; appropriate affect; normal thought process Neuro: Sensation intact throughout. No gross coordination deficits.   Ortho Exam - Right wrist: Examination of the right hand and wrist shows no swelling, cap refill less than 2 seconds.  Positive Durkan's test. + Shake test.  Full range of motion of the wrist with flexion and extension.  Imaging:  [place] Extrem Up Right Ltd Limited musculoskeletal ultrasound of the right upper extremity, right  volar wrist was performed today.  Evaluation of the carpal tunnel shows  the flexor retinaculum overlying.  The median nerve was evaluated which  does show a mild degree of hypoechoic swelling.  The area of the median  nerve was 0.098 cm.  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  FCR  tendon was seen without evidence of tearing or hypoechoic fluid.  Limited  evaluation of the dorsal distal radius shows no cortical regularity.   Radial and ulnar artery were identified and avoided during procedure.  *Technically successful ultrasound-guided carpal tunnel and median nerve  hydrodissection injection  *Please see all images under report "US Guided Needle Placement"    Past Medical/Family/Surgical/Social History: Medications & Allergies reviewed per EMR, new medications updated. Patient Active Problem List  Diagnosis Date Noted   History of colonic polyps 02/22/2022   Polyp of colon 02/22/2022   Myopia of both eyes 07/15/2021   Right cervical radiculopathy 06/13/2021   Anxiety 01/30/2021   Cervicalgia 01/30/2021   Disorder of upper arm joint 01/30/2021   Lesion of ulnar nerve  01/30/2021   Nontoxic uninodular goiter 01/30/2021   Observation for suspected condition 01/30/2021   Other personal history presenting hazards to health 01/30/2021   Personal history of return from [employer] deployment 01/30/2021   Screening for malignant neoplasm of cervix 01/30/2021   Sinusitis 01/30/2021   Vitamin D deficiency 01/02/2021   Other hypersomnia 12/12/2020   IUD (intrauterine device) in place 05/11/2020   Anxiety with depression 09/09/2019   Gastroesophageal reflux disease without esophagitis 08/26/2018   Moderate episode of recurrent major depressive disorder (HCC) 08/26/2018   GAD (generalized anxiety disorder) 08/26/2018   Dry eyes 08/26/2018   Immune to varicella 06/01/2017   Family history of colonic polyps    Benign neoplasm of sigmoid colon    Family history of colon cancer 07/31/2015   Obesity, morbid (HCC) 07/31/2015   Loss of feeling or sensation 06/22/2015   Chronic pain in left foot 06/22/2015   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HCC) 12/16/2013   Arthralgia of multiple joints 12/16/2013   Goiter, nontoxic, 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   Adult hypothyroidism 09/07/2013   Past Medical History:  Diagnosis Date   Arthritis    Chronic fatigue syndrome    GERD (gastroesophageal reflux disease)    Headache    tension/cluster   Hypothyroidism    Motion sickness    Plantar fasciitis of left foot    PONV (postoperative nausea and vomiting)    Thyroid disease    Vaginal Pap smear, abnormal    Family History  Problem Relation Age of Onset   Arthritis Mother    COPD Mother    Thyroid disease Mother    Arthritis Father    Hyperlipidemia Father    Cancer Father        polyp removed   Heart disease Father    Hashimoto's thyroiditis Sister    Arthritis Maternal Grandmother    Cancer Maternal Grandmother        colon   Heart disease Maternal Grandmother    Thyroid disease Maternal Grandmother    Alcohol abuse Maternal Grandfather     Arthritis Maternal Grandfather    Arthritis Paternal Grandmother    Depression Paternal Grandmother    Hypertension Paternal Grandmother    Stroke Paternal Grandmother    Arthritis Paternal Grandfather    Heart disease Paternal Grandfather    Hypertension Paternal Grandfather    Breast cancer Other    Past Surgical History:  Procedure Laterality Date   BLADDER SURGERY     CERVICAL BIOPSY  W/ LOOP ELECTRODE EXCISION     COLONOSCOPY WITH PROPOFOL N/A 01/05/2016   Procedure: COLONOSCOPY WITH PROPOFOL;  Surgeon: Midge Minium, MD;  Location: [facility];  Service: Endoscopy;  Laterality: N/A;   COLONOSCOPY WITH PROPOFOL N/A 02/22/2022   Procedure: COLONOSCOPY WITH PROPOFOL;  Surgeon: Midge Minium, MD;  Location[facility];  Service: Endoscopy;  Laterality: N/A;   FOOT SURGERY Left    POLYPECTOMY  01/05/2016   Procedure: POLYPECTOMY;  Surgeon: Midge Minium, MD;  Location: [facility];  Service: Endoscopy;;   POLYPECTOMY N/A 02/22/2022   Procedure: POLYPECTOMY;  Surgeon: Midge Minium, MD;  Location: [facility];  Service: Endoscopy;  Laterality: N/A;  Social History   Occupational History   Occupation: RN  Tobacco Use   Smoking status: Former    Types: Cigarettes    Quit date: 06/14/2003    Years since quitting: 19.3   Smokeless tobacco: Never   Tobacco comments:    quit 06/2003  Vaping Use   Vaping Use: Never used  Substance and Sexual Activity   Alcohol use: Yes    Alcohol/week: 0.0 standard drinks of alcohol    Comment: occasionally    Drug use: No   Sexual activity: Yes    Partners: Male    Birth control/protection: I.U.D.    Comment: mirena

## 2022-10-14 ENCOUNTER — Other Ambulatory Visit: Payer: Self-pay | Admitting: Nurse Practitioner

## 2022-10-14 ENCOUNTER — Encounter: Payer: Self-pay | Admitting: Nurse Practitioner

## 2022-10-14 DIAGNOSIS — Z1231 Encounter for screening mammogram for malignant neoplasm of breast: Secondary | ICD-10-CM

## 2022-11-06 NOTE — Progress Notes (Deleted)
Name: Courtney Alvarez   MRN: [id number]    DOB: 27-Oct-1975   Date:11/06/2022       Progress Note  Subjective  Chief Complaint  No chief complaint on file.   HPI  Patient presents for annual CPE.  Diet: *** Exercise: ***  Sleep: *** Last dental exam:*** Last eye exam: ***  Flowsheet Row Office Visit from 08/29/2022 in [facility]  AUDIT-C Score 0      Depression: Phq 9 is  {Desc; negative/positive:13464}    08/29/2022    8:54 AM 04/16/2022    3:07 PM 09/21/2021    3:30 PM 08/20/2021   10:45 AM 06/13/2021    8:39 AM  Depression screen PHQ 2/9  Decreased Interest 0 0 0 1 0  Down, Depressed, Hopeless 0 0 0 0 0  PHQ - 2 Score 0 0 0 1 0  Altered sleeping  0  0   Tired, decreased energy  0  0   Change in appetite  0  0   Feeling bad or failure about yourself   0  0   Trouble concentrating  0  0   Moving slowly or fidgety/restless  0  0   Suicidal thoughts  0  0   PHQ-9 Score  0  1   Difficult doing work/chores  Not difficult at all      Hypertension: BP Readings from Last 3 Encounters:  08/29/22 120/72  04/16/22 120/80  02/22/22 117/71   Obesity: Wt Readings from Last 3 Encounters:  08/29/22 197 lb (89.4 kg)  04/16/22 202 lb 8 oz (91.9 kg)  02/22/22 199 lb (90.3 kg)   BMI Readings from Last 3 Encounters:  08/29/22 31.80 kg/m  04/16/22 32.68 kg/m  02/22/22 32.12 kg/m     Vaccines:  HPV: up to at age 66 , ask insurance if age between 71-45  Shingrix: 3-64 yo and ask insurance if covered when patient above 3 yo Pneumonia: *** educated and discussed with patient. Flu: *** educated and discussed with patient.  Hep C Screening: *** STD testing and prevention (HIV/chl/gon/syphilis): *** Intimate partner violence:*** Sexual History : Menstrual History/LMP/Abnormal Bleeding:  Incontinence Symptoms:   Breast cancer:  - Last Mammogram: *** - BRCA gene screening: ***  Osteoporosis: Discussed high calcium and vitamin D supplementation,  weight bearing exercises  Cervical cancer screening: ***  Skin cancer: Discussed monitoring for atypical lesions  Colorectal cancer: ***   Lung cancer:  *** Low Dose CT Chest recommended if Age 40-80 years, 20 pack-year currently smoking OR have quit w/in 15years. Patient {DOES NOT does:27190::"does not"} qualify.   ECG: ***  Advanced Care Planning: A voluntary discussion about advance care planning including the explanation and discussion of advance directives.  Discussed health care proxy and Living will, and the patient was able to identify a health care proxy as ***.  Patient {DOES_DOES WUJ:81191} have a living will at present time. If patient does have living will, I have requested they bring this to the clinic to be scanned in to their chart.  Lipids: Lab Results  Component Value Date   CHOL 198 08/20/2021   CHOL 206 (H) 01/02/2021   CHOL 208 (H) 08/27/2018   Lab Results  Component Value Date   HDL 72 08/20/2021   HDL 61 01/02/2021   HDL 73 08/27/2018   Lab Results  Component Value Date   LDLCALC 99 08/20/2021   LDLCALC 120 (H) 01/02/2021   LDLCALC 118 (H) 08/27/2018  Lab Results  Component Value Date   TRIG 167 (H) 08/20/2021   TRIG 141 01/02/2021   TRIG 80 08/27/2018   Lab Results  Component Value Date   CHOLHDL 2.8 08/20/2021   CHOLHDL 3.4 01/02/2021   CHOLHDL 2.8 08/27/2018   No results found for: "LDLDIRECT"  Glucose: Glucose, Bld  Date Value Ref Range Status  01/02/2021 88 65 - 99 mg/dL Final    Comment:    .            Fasting reference interval .   09/09/2019 89 65 - 99 mg/dL Final    Comment:    .            Fasting reference interval .   05/12/2018 100 (H) 65 - 99 mg/dL Final    Comment:    .            Fasting reference interval . For someone without known diabetes, a glucose value between 100 and 125 mg/dL is consistent with prediabetes and should be confirmed with a follow-up test. .     Patient Active Problem List    Diagnosis Date Noted   History of colonic polyps 02/22/2022   Polyp of colon 02/22/2022   Myopia of both eyes 07/15/2021   Right cervical radiculopathy 06/13/2021   Anxiety 01/30/2021   Cervicalgia 01/30/2021   Disorder of upper arm joint 01/30/2021   Lesion of ulnar nerve 01/30/2021   Nontoxic uninodular goiter 01/30/2021   Observation for suspected condition 01/30/2021   Other personal history presenting hazards to health 01/30/2021   Personal history of return from [employer] deployment 01/30/2021   Screening for malignant neoplasm of cervix 01/30/2021   Sinusitis 01/30/2021   Vitamin D deficiency 01/02/2021   Other hypersomnia 12/12/2020   IUD (intrauterine device) in place 05/11/2020   Anxiety with depression 09/09/2019   Gastroesophageal reflux disease without esophagitis 08/26/2018   Moderate episode of recurrent major depressive disorder (HCC) 08/26/2018   GAD (generalized anxiety disorder) 08/26/2018   Dry eyes 08/26/2018   Immune to varicella 06/01/2017   Family history of colonic polyps    Benign neoplasm of sigmoid colon    Family history of colon cancer 07/31/2015   Obesity, morbid (HCC) 07/31/2015   Loss of feeling or sensation 06/22/2015   Chronic pain in left foot 06/22/2015   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HCC) 12/16/2013   Arthralgia of multiple joints 12/16/2013   Goiter, nontoxic, 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   Adult hypothyroidism 09/07/2013    Past Surgical History:  Procedure Laterality Date   BLADDER SURGERY     CERVICAL BIOPSY  W/ LOOP ELECTRODE EXCISION     COLONOSCOPY WITH PROPOFOL N/A 01/05/2016   Procedure: COLONOSCOPY WITH PROPOFOL;  Surgeon: Midge Minium, MD;  Location: [facility];  Service: Endoscopy;  Laterality: N/A;   COLONOSCOPY WITH PROPOFOL N/A 02/22/2022   Procedure: COLONOSCOPY WITH PROPOFOL;  Surgeon: Midge Minium, MD;  Location: [facility];  Service: Endoscopy;  Laterality: N/A;   FOOT SURGERY  Left    POLYPECTOMY  01/05/2016   Procedure: POLYPECTOMY;  Surgeon: Midge Minium, MD;  Location: [facility];  Service: Endoscopy;;   POLYPECTOMY N/A 02/22/2022   Procedure: POLYPECTOMY;  Surgeon: Midge Minium, MD;  Location: [facility];  Service: Endoscopy;  Laterality: N/A;    Family History  Problem Relation Age of Onset   Arthritis Mother    COPD Mother    Thyroid disease Mother    Arthritis Father  Hyperlipidemia Father    Cancer Father        polyp removed   Heart disease Father    Hashimoto's thyroiditis Sister    Arthritis Maternal Grandmother    Cancer Maternal Grandmother        colon   Heart disease Maternal Grandmother    Thyroid disease Maternal Grandmother    Alcohol abuse Maternal Grandfather    Arthritis Maternal Grandfather    Arthritis Paternal Grandmother    Depression Paternal Grandmother    Hypertension Paternal Grandmother    Stroke Paternal Grandmother    Arthritis Paternal Grandfather    Heart disease Paternal Grandfather    Hypertension Paternal Grandfather    Breast cancer Other     Social History   Socioeconomic History   Marital status: Married    Spouse name: Jeannett Senior   Number of children: 1   Years of education: Not on file   Highest education level: Associate degree: academic program  Occupational History   Occupation: RN  Tobacco Use   Smoking status: Former    Current packs/day: 0.00    Types: Cigarettes    Quit date: 06/14/2003    Years since quitting: 19.4   Smokeless tobacco: Never   Tobacco comments:    quit 06/2003  Vaping Use   Vaping status: Never Used  Substance and Sexual Activity   Alcohol use: Yes    Alcohol/week: 0.0 standard drinks of alcohol    Comment: occasionally    Drug use: No   Sexual activity: Yes    Partners: Male    Birth control/protection: I.U.D.    Comment: mirena  Other Topics Concern   Not on file  Social History Narrative   Not on file   Social Determinants of Health    Financial Resource Strain: Low Risk  (08/28/2022)   Overall Financial Resource Strain (CARDIA)    Difficulty of Paying Living Expenses: Not hard at all  Food Insecurity: No Food Insecurity (08/28/2022)   Hunger Vital Sign    Worried About Running Out of Food in the Last Year: Never true    Ran Out of Food in the Last Year: Never true  Transportation Needs: No Transportation Needs (08/28/2022)   PRAPARE - Administrator, Civil Service (Medical): No    Lack of Transportation (Non-Medical): No  Physical Activity: Sufficiently Active (08/28/2022)   Exercise Vital Sign    Days of Exercise per Week: 4 days    Minutes of Exercise per Session: 120 min  Stress: No Stress Concern Present (08/28/2022)   [employer] of Occupational Health - Occupational Stress Questionnaire    Feeling of Stress : Only a little  Social Connections: Socially Isolated (08/28/2022)   Social Connection and Isolation Panel [NHANES]    Frequency of Communication with Friends and Family: Once a week    Frequency of Social Gatherings with Friends and Family: Never    Attends Religious Services: Never    Database administrator or Organizations: No    Attends Engineer, structural: Not on file    Marital Status: Married  Catering manager Violence: Not At Risk (08/20/2021)   Humiliation, Afraid, Rape, and Kick questionnaire    Fear of Current or Ex-Partner: No    Emotionally Abused: No    Physically Abused: No    Sexually Abused: No     Current Outpatient Medications:    Acetaminophen 325 MG CAPS, , Disp: , Rfl:    amoxicillin-clavulanate (AUGMENTIN) 875-125 MG  tablet, Take 1 tablet by mouth 2 (two) times daily., Disp: 20 tablet, Rfl: 0   amphetamine-dextroamphetamine (ADDERALL XR) 30 MG 24 hr capsule, Take 1 capsule (30 mg total) by mouth daily., Disp: 90 capsule, Rfl: 0   fluconazole (DIFLUCAN) 150 MG tablet, Take 1 tablet (150 mg total) by mouth every 3 (three) days as needed (for vaginal  itching/yeast infection sx)., Disp: 2 tablet, Rfl: 0   FLUoxetine HCl 60 MG TABS, Take 1 tablet (60 mg) by mouth daily with breakfast., Disp: 90 tablet, Rfl: 0   lansoprazole (PREVACID) 15 MG capsule, , Disp: , Rfl:    Multiple Vitamins-Minerals (MULTIVITAMIN GUMMIES WOMENS) CHEW, Chew 2 tablets by mouth daily. , Disp: , Rfl:    Omega-3 1000 MG CAPS, Take by mouth., Disp: , Rfl:    thyroid (NP THYROID) 120 MG tablet, Take 1 tablet (120 mg total) by mouth daily with breakfast., Disp: 90 tablet, Rfl: 0   venlafaxine XR (EFFEXOR XR) 75 MG 24 hr capsule, Take 1 capsule (75 mg total) by mouth daily., Disp: 90 capsule, Rfl: 0  No Known Allergies   ROS  ***  Objective  There were no vitals filed for this visit.  There is no height or weight on file to calculate BMI.  Physical Exam ***  No results found for this or any previous visit (from the past 2160 hour(s)).  Diabetic Foot Exam: Diabetic Foot Exam - Simple   No data filed    ***  Fall Risk:    08/29/2022    8:54 AM 04/16/2022    3:06 PM 09/21/2021    3:29 PM 08/20/2021   10:44 AM 06/13/2021    8:39 AM  Fall Risk   Falls in the past year? 0 0 0 0 0  Number falls in past yr: 0 0 0 0 0  Injury with Fall? 0 0 0 0 0  Follow up  Falls evaluation completed Falls evaluation completed Falls evaluation completed Falls evaluation completed   ***  Functional Status Survey:   ***  Assessment & Plan  There are no diagnoses linked to this encounter.  -USPSTF grade A and B recommendations reviewed with patient; age-appropriate recommendations, preventive care, screening tests, etc discussed and encouraged; healthy living encouraged; see AVS for patient education given to patient -Discussed importance of 150 minutes of physical activity weekly, eat two servings of fish weekly, eat one serving of tree nuts ( cashews, pistachios, pecans, almonds.Marland Kitchen) every other day, eat 6 servings of fruit/vegetables daily and drink plenty of water and  avoid sweet beverages.   -Reviewed Health Maintenance: ***

## 2022-11-07 ENCOUNTER — Ambulatory Visit: Payer: Federal, State, Local not specified - PPO | Admitting: Nurse Practitioner

## 2022-11-14 ENCOUNTER — Other Ambulatory Visit: Payer: Self-pay

## 2022-11-14 DIAGNOSIS — F411 Generalized anxiety disorder: Secondary | ICD-10-CM | POA: Diagnosis not present

## 2022-11-14 DIAGNOSIS — F321 Major depressive disorder, single episode, moderate: Secondary | ICD-10-CM | POA: Diagnosis not present

## 2022-11-14 MED ORDER — VENLAFAXINE HCL ER 75 MG PO CP24
75.0000 mg | ORAL_CAPSULE | Freq: Every day | ORAL | 0 refills | Status: DC
  Filled 2022-11-14: qty 90, 90d supply, fill #0

## 2022-11-14 MED ORDER — AMPHETAMINE-DEXTROAMPHET ER 30 MG PO CP24
30.0000 mg | ORAL_CAPSULE | Freq: Every day | ORAL | 0 refills | Status: DC
  Filled 2022-11-14: qty 90, 90d supply, fill #0

## 2022-11-14 MED ORDER — FLUOXETINE HCL 60 MG PO TABS
60.0000 mg | ORAL_TABLET | Freq: Every day | ORAL | 0 refills | Status: DC
  Filled 2022-11-14 – 2023-04-17 (×4): qty 90, 90d supply, fill #0

## 2022-11-15 ENCOUNTER — Ambulatory Visit: Payer: Federal, State, Local not specified - PPO | Admitting: Orthopedic Surgery

## 2022-11-18 ENCOUNTER — Encounter: Payer: Self-pay | Admitting: Nurse Practitioner

## 2022-11-19 ENCOUNTER — Other Ambulatory Visit: Payer: Self-pay | Admitting: Nurse Practitioner

## 2022-11-19 ENCOUNTER — Other Ambulatory Visit: Payer: Self-pay

## 2022-11-19 DIAGNOSIS — E039 Hypothyroidism, unspecified: Secondary | ICD-10-CM

## 2022-11-20 NOTE — Telephone Encounter (Signed)
Requested medication (s) are due for refill today: yes  Requested medication (s) are on the active medication list: yes  Last refill:  08/06/22  Future visit scheduled: no  Notes to clinic:  Unable to refill per protocol due to failed labs, no updated TSH results. Routing for approval.     Requested Prescriptions  Pending Prescriptions Disp Refills   NP THYROID 120 MG tablet [Pharmacy Med Name: thyroid (NP THYROID) 120 MG tablet] 90 tablet 0    Sig: Take 1 tablet (120 mg total) by mouth daily with breakfast.     Endocrinology:  Hypothyroid Agents Failed - 11/19/2022 11:27 AM      Failed - TSH in normal range and within 360 days    TSH  Date Value Ref Range Status  08/20/2021 1.34 mIU/L Final    Comment:              Reference Range .           > or = 20 Years  0.40-4.50 .                Pregnancy Ranges           First trimester    0.26-2.66           Second trimester   0.55-2.73           Third trimester    0.43-2.91          Passed - Valid encounter within last 12 months    Recent Outpatient Visits           2 months ago Acute recurrent pansinusitis   [facility] Berniece Salines, FNP   7 months ago Eczema, unspecified type   [facility] Berniece Salines, FNP   1 year ago Acute diffuse otitis externa of right ear   [facility] Berniece Salines, FNP   1 year ago Annual physical exam   [facility] Berniece Salines, FNP   1 year ago Acute cystitis with hematuria   [facility] Berniece Salines, FNP

## 2022-11-21 ENCOUNTER — Other Ambulatory Visit: Payer: Self-pay

## 2022-11-22 ENCOUNTER — Other Ambulatory Visit: Payer: Self-pay | Admitting: Nurse Practitioner

## 2022-11-22 ENCOUNTER — Other Ambulatory Visit: Payer: Self-pay

## 2022-11-22 MED FILL — Thyroid Tab 120 MG (2 Grain): ORAL | 30 days supply | Qty: 30 | Fill #0 | Status: CN

## 2022-11-24 ENCOUNTER — Other Ambulatory Visit: Payer: Self-pay

## 2022-11-29 ENCOUNTER — Other Ambulatory Visit: Payer: Self-pay

## 2022-12-04 ENCOUNTER — Other Ambulatory Visit: Payer: Self-pay

## 2022-12-04 MED FILL — Thyroid Tab 120 MG (2 Grain): ORAL | 30 days supply | Qty: 30 | Fill #0 | Status: AC

## 2022-12-09 ENCOUNTER — Ambulatory Visit: Payer: Federal, State, Local not specified - PPO | Admitting: Orthopedic Surgery

## 2022-12-09 DIAGNOSIS — G5601 Carpal tunnel syndrome, right upper limb: Secondary | ICD-10-CM

## 2022-12-10 ENCOUNTER — Encounter: Payer: Self-pay | Admitting: Orthopedic Surgery

## 2022-12-10 NOTE — Progress Notes (Signed)
Office Visit Note   Patient: Courtney Alvarez           Date of Birth: 27-Aug-1975           MRN: [id number] Visit Date: 12/09/2022 Requested by: Berniece Salines, FNP [address] [place],  [place] [address] PCP: Berniece Salines, FNP  Subjective: Chief Complaint  Patient presents with   Right Wrist - Follow-up    HPI: Courtney Alvarez is a 47 y.o. female who presents to the office reporting right wrist pain.  Patient has a known history of cervical spine pathology.  She has had surgery and has had a repeat MRI scan which was deemed to be acceptable.  That surgery was done by [facility] last year.  Patient describes dorsal greater than palmar numbness and tingling right more than the left-hand side.  Patient also reports some scapular pain.  Has tried a neck injection without relief.  She works as a Engineer, civil (consulting) at [employer].  She states that Dr. Shon Baton did a carpal tunnel injection with hydrodissection which did help for a month after the injection on 10/10/2022.  Now the numbness and tingling is started to return.  She does have mild carpal tunnel syndrome by nerve conduction study.  She states that she has decreased strength in grip and it is hard for her to grasp.  Pain does wake her from sleep at night.  She has a wrist splint which has not helped particularly significantly..                ROS: All systems reviewed are negative as they relate to the chief complaint within the history of present illness.  Patient denies fevers or chills.  Assessment & Plan: Visit Diagnoses:  1. 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  She did have good relief from the hydrodissection and injection done by Dr. Shon Baton almost 2 months ago.  Symptoms and started to recur.  She does have dorsal greater than palmar numbness which is a little bit more aligned with cervical spine problem.  Also the scapular pain is more cervical spine  in nature.  Nonetheless with a double crush syndrome I think would be easier to treat the hand then to do any type of repeat surgery on the neck especially in light of no obvious pathology on the MRI scan of the cervical spine.  Plan at this time is to consider right carpal tunnel release.  The risk and benefits are discussed including not limited to infection nerve and vessel damage loss of grip strength for 1 to 2 months as well as no improvement in symptoms.  Patient understands the risk and benefits and may want to proceed in the future.  She will follow-up with [place] as needed.  Debbie's card provided.  Follow-Up Instructions: No follow-ups on file.   Orders:  No orders of the defined types were placed in this encounter.  No orders of the defined types were placed in this encounter.     Procedures: No procedures performed   Clinical Data: No additional findings.  Objective: Vital Signs: There were no vitals taken for this visit.  Physical Exam:  Constitutional: Patient appears well-developed HEENT:  Head: Normocephalic Eyes:EOM are normal Neck: Normal range of motion Cardiovascular: Normal rate Pulmonary/chest: Effort normal Neurologic: Patient is alert Skin: Skin is warm Psychiatric: Patient has normal mood and affect  Ortho Exam: Ortho exam demonstrates  pretty reasonable cervical spine range of motion flexion extension and rotation with no reproduction of symptoms with rotation of the head to the right.  Has good shoulder range of motion and strength to rotator cuff testing.  5 out of 5 grip EPL FPL interosseous wrist flexion wrist extension bicep triceps and deltoid strength.  Negative Tinel's cubital tunnel.  No abductor pollicis brevis wasting on the right or left-hand side.  No definite paresthesias median radial or ulnar distribution.  Grip strength symmetric.  Does have some tenderness with carpal tunnel compression testing on the right compared to the left.  Specialty  Comments:  MRI CERVICAL SPINE WITHOUT CONTRAST   TECHNIQUE: Multiplanar, multisequence MR imaging of the cervical spine was performed. No intravenous contrast was administered.   COMPARISON:  07/29/2020   FINDINGS: Alignment: Physiologic.   Vertebrae: No acute fracture, evidence of discitis, or aggressive bone lesion.   Cord: Normal signal and morphology.   Posterior Fossa, vertebral arteries, paraspinal tissues: Posterior fossa demonstrates no focal abnormality. Vertebral artery flow voids are maintained. Paraspinal soft tissues are unremarkable.   Disc levels:   Discs: Anterior cervical disc fusion at C4-C6. Disc space narrowing at C6-7.   C2-3: No disc protrusion. Mild left foraminal stenosis. Mild left facet arthropathy. No right foraminal stenosis. No spinal stenosis.   C3-4: Mild broad-based disc bulge. Bilateral uncovertebral degenerative changes. Mild bilateral foraminal stenosis. No spinal stenosis.   C4-5: Interbody fusion. Moderate bilateral foraminal stenosis. No spinal stenosis.   C5-6: Interbody fusion. Moderate right and severe left foraminal stenosis. No spinal stenosis.   C6-7: Mild broad-based disc bulge. Mild bilateral foraminal stenosis. No spinal stenosis.   C7-T1: No significant disc bulge. No neural foraminal stenosis. No central canal stenosis.   IMPRESSION: 1. Anterior cervical disc fusion at C4-C6. At C4-5 there is moderate bilateral foraminal stenosis. At C5-6 there is moderate right and severe left foraminal stenosis. 2. At C3-4 there is a mild broad-based disc bulge. Bilateral uncovertebral degenerative changes. Mild bilateral foraminal stenosis. 3. At C6-7 there is a mild broad-based disc bulge. Mild bilateral foraminal stenosis. 4. No acute osseous injury of the cervical spine.     Electronically Signed   By: Elige Ko M.D.   On: 08/05/2022 12:16 --- 10/27/2020 Electrodiagnostic study of hte right upper  extremity Impression: The above electrodiagnostic study is ABNORMAL and reveals evidence of a mild right median nerve entrapment at the wrist (carpal tunnel syndrome) affecting sensory components. There is no significant electrodiagnostic evidence of any other focal nerve entrapment, brachial plexopathy or cervical radiculopathy.    Recommendations: 1.  Follow-up with referring physician. 2.  Continue current management of symptoms. 3.  Although mild electrically this could represent a source of some of her symptoms.  Consider diagnostic injection or treatment with night splint.   ___________________________ [facility] Pl[facility] Board Certified, American Board of Physical Medicine and Rehabilitation  Imaging: No results found.   PMFS History: Patient Active Problem List   Diagnosis Date Noted   History of colonic polyps 02/22/2022   Polyp of colon 02/22/2022   Myopia of both eyes 07/15/2021   Right cervical radiculopathy 06/13/2021   Anxiety 01/30/2021   Cervicalgia 01/30/2021   Disorder of upper arm joint 01/30/2021   Lesion of ulnar nerve 01/30/2021   Nontoxic uninodular goiter 01/30/2021   Observation for suspected condition 01/30/2021   Other personal history presenting hazards to health 01/30/2021   Personal history of return from [employer] deployment 01/30/2021   Screening for malignant  neoplasm of cervix 01/30/2021   Sinusitis 01/30/2021   Vitamin D deficiency 01/02/2021   Other hypersomnia 12/12/2020   IUD (intrauterine device) in place 05/11/2020   Anxiety with depression 09/09/2019   Gastroesophageal reflux disease without esophagitis 08/26/2018   Moderate episode of recurrent major depressive disorder (HCC) 08/26/2018   GAD (generalized anxiety disorder) 08/26/2018   Dry eyes 08/26/2018   Immune to varicella 06/01/2017   Family history of colonic polyps    Benign neoplasm of sigmoid colon    Family history of colon cancer 07/31/2015   Obesity, morbid (HCC)  07/31/2015   Loss of feeling or sensation 06/22/2015   Chronic pain in left foot 06/22/2015   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HCC) 12/16/2013   Arthralgia of multiple joints 12/16/2013   Goiter, nontoxic, 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   Adult hypothyroidism 09/07/2013   Past Medical History:  Diagnosis Date   Arthritis    Chronic fatigue syndrome    GERD (gastroesophageal reflux disease)    Headache    tension/cluster   Hypothyroidism    Motion sickness    Plantar fasciitis of left foot    PONV (postoperative nausea and vomiting)    Thyroid disease    Vaginal Pap smear, abnormal     Family History  Problem Relation Age of Onset   Arthritis Mother    COPD Mother    Thyroid disease Mother    Arthritis Father    Hyperlipidemia Father    Cancer Father        polyp removed   Heart disease Father    Hashimoto's thyroiditis Sister    Arthritis Maternal Grandmother    Cancer Maternal Grandmother        colon   Heart disease Maternal Grandmother    Thyroid disease Maternal Grandmother    Alcohol abuse Maternal Grandfather    Arthritis Maternal Grandfather    Arthritis Paternal Grandmother    Depression Paternal Grandmother    Hypertension Paternal Grandmother    Stroke Paternal Grandmother    Arthritis Paternal Grandfather    Heart disease Paternal Grandfather    Hypertension Paternal Grandfather    Breast cancer Other     Past Surgical History:  Procedure Laterality Date   BLADDER SURGERY     CERVICAL BIOPSY  W/ LOOP ELECTRODE EXCISION     COLONOSCOPY WITH PROPOFOL N/A 01/05/2016   Procedure: COLONOSCOPY WITH PROPOFOL;  Surgeon: Midge Minium, MD;  Location: [facility];  Service: Endoscopy;  Laterality: N/A;   COLONOSCOPY WITH PROPOFOL N/A 02/22/2022   Procedure: COLONOSCOPY WITH PROPOFOL;  Surgeon: Midge Minium, MD;  Location: [facility];  Service: Endoscopy;  Laterality: N/A;   FOOT SURGERY Left    POLYPECTOMY  01/05/2016    Procedure: POLYPECTOMY;  Surgeon: Midge Minium, MD;  Location: [facility];  Service: Endoscopy;;   POLYPECTOMY N/A 02/22/2022   Procedure: POLYPECTOMY;  Surgeon: Midge Minium, MD;  Location: [facility];  Service: Endoscopy;  Laterality: N/A;   Social History   Occupational History   Occupation: RN  Tobacco Use   Smoking status: Former    Current packs/day: 0.00    Types: Cigarettes    Quit date: 06/14/2003    Years since quitting: 19.5   Smokeless tobacco: Never   Tobacco comments:    quit 06/2003  Vaping Use   Vaping status: Never Used  Substance and Sexual Activity   Alcohol use: Yes    Alcohol/week: 0.0 standard drinks of alcohol  Comment: occasionally    Drug use: No   Sexual activity: Yes    Partners: Male    Birth control/protection: I.U.D.    Comment: mirena

## 2023-01-01 ENCOUNTER — Ambulatory Visit
Admission: RE | Admit: 2023-01-01 | Discharge: 2023-01-01 | Disposition: A | Discharge status: Home / Self Care | Payer: Federal, State, Local not specified - PPO | Source: Ambulatory Visit | Attending: Nurse Practitioner | Admitting: Nurse Practitioner

## 2023-01-01 DIAGNOSIS — Z1231 Encounter for screening mammogram for malignant neoplasm of breast: Secondary | ICD-10-CM | POA: Diagnosis not present

## 2023-01-03 ENCOUNTER — Other Ambulatory Visit: Payer: Self-pay

## 2023-01-03 ENCOUNTER — Other Ambulatory Visit: Payer: Self-pay | Admitting: Nurse Practitioner

## 2023-01-03 DIAGNOSIS — E039 Hypothyroidism, unspecified: Secondary | ICD-10-CM

## 2023-01-06 ENCOUNTER — Other Ambulatory Visit: Payer: Self-pay

## 2023-01-06 NOTE — Telephone Encounter (Signed)
Requested medications are due for refill today.  yes  Requested medications are on the active medications list.  yes  Last refill. 11/22/2022 #30 0 rf - courtesy refill  Future visit scheduled.   yes  Notes to clinic.  Labs are expired.    Requested Prescriptions  Pending Prescriptions Disp Refills   NP THYROID 120 MG tablet [Pharmacy Med Name: thyroid (NP THYROID) 120 MG tablet] 30 tablet 0    Sig: Take 1 tablet (120 mg total) by mouth daily with breakfast. Patient needs an appointment for labs     Endocrinology:  Hypothyroid Agents Failed - 01/03/2023 12:58 PM      Failed - TSH in normal range and within 360 days    TSH  Date Value Ref Range Status  08/20/2021 1.34 mIU/L Final    Comment:              Reference Range .           > or = 20 Years  0.40-4.50 .                Pregnancy Ranges           First trimester    0.26-2.66           Second trimester   0.55-2.73           Third trimester    0.43-2.91          Passed - Valid encounter within last 12 months    Recent Outpatient Visits           4 months ago Acute recurrent pansinusitis   [facility] Berniece Salines, FNP   8 months ago Eczema, unspecified type   [facility] Berniece Salines, FNP   1 year ago Acute diffuse otitis externa of right ear   [facility] Berniece Salines, FNP   1 year ago Annual physical exam   [facility] Berniece Salines, FNP   1 year ago Acute cystitis with hematuria   [facility] Berniece Salines, FNP       Future Appointments             In 1 week Zane Herald, Rudolpho Sevin, FNP [facility], [facility]

## 2023-01-06 NOTE — Telephone Encounter (Signed)
Pt has an appt on 01/13/23 with Raynelle Fanning

## 2023-01-09 ENCOUNTER — Other Ambulatory Visit: Payer: Self-pay | Admitting: Nurse Practitioner

## 2023-01-09 DIAGNOSIS — E039 Hypothyroidism, unspecified: Secondary | ICD-10-CM

## 2023-01-10 ENCOUNTER — Other Ambulatory Visit: Payer: Self-pay | Admitting: Nurse Practitioner

## 2023-01-10 ENCOUNTER — Other Ambulatory Visit: Payer: Self-pay

## 2023-01-10 DIAGNOSIS — E039 Hypothyroidism, unspecified: Secondary | ICD-10-CM

## 2023-01-10 MED FILL — Thyroid Tab 120 MG (2 Grain): ORAL | 30 days supply | Qty: 30 | Fill #0 | Status: AC

## 2023-01-10 NOTE — Telephone Encounter (Signed)
Requested medications are due for refill today.  yes  Requested medications are on the active medications list.  yes  Last refill. 11/22/2022 #30 0 rf  Future visit scheduled.   yes  Notes to clinic.  Labs are expired.    Requested Prescriptions  Pending Prescriptions Disp Refills   thyroid (NP THYROID) 120 MG tablet 30 tablet 0    Sig: Take 1 tablet (120 mg total) by mouth daily with breakfast. Patient needs an appointment for labs     Endocrinology:  Hypothyroid Agents Failed - 01/09/2023 12:28 PM      Failed - TSH in normal range and within 360 days    TSH  Date Value Ref Range Status  08/20/2021 1.34 mIU/L Final    Comment:              Reference Range .           > or = 20 Years  0.40-4.50 .                Pregnancy Ranges           First trimester    0.26-2.66           Second trimester   0.55-2.73           Third trimester    0.43-2.91          Passed - Valid encounter within last 12 months    Recent Outpatient Visits           4 months ago Acute recurrent pansinusitis   [facility] Berniece Salines, FNP   8 months ago Eczema, unspecified type   [facility] Berniece Salines, FNP   1 year ago Acute diffuse otitis externa of right ear   [facility] Berniece Salines, FNP   1 year ago Annual physical exam   [facility] Berniece Salines, FNP   1 year ago Acute cystitis with hematuria   [facility] Berniece Salines, FNP       Future Appointments             In 3 days Zane Herald, Rudolpho Sevin, FNP [facility], [facility]

## 2023-01-10 NOTE — Telephone Encounter (Signed)
Requested medications are due for refill today.  yes  Requested medications are on the active medications list.  yes  Last refill. 11/22/2022 #30 0 rf  Future visit scheduled.   yes  Notes to clinic.  Labs are expired.    Requested Prescriptions  Pending Prescriptions Disp Refills   NP THYROID 120 MG tablet [Pharmacy Med Name: thyroid (NP THYROID) 120 MG tablet] 30 tablet 0    Sig: Take 1 tablet (120 mg total) by mouth daily with breakfast. Patient needs an appointment for labs     Endocrinology:  Hypothyroid Agents Failed - 01/10/2023  8:18 AM      Failed - TSH in normal range and within 360 days    TSH  Date Value Ref Range Status  08/20/2021 1.34 mIU/L Final    Comment:              Reference Range .           > or = 20 Years  0.40-4.50 .                Pregnancy Ranges           First trimester    0.26-2.66           Second trimester   0.55-2.73           Third trimester    0.43-2.91          Passed - Valid encounter within last 12 months    Recent Outpatient Visits           4 months ago Acute recurrent pansinusitis   [facility] Berniece Salines, FNP   8 months ago Eczema, unspecified type   [facility] Berniece Salines, FNP   1 year ago Acute diffuse otitis externa of right ear   [facility] Berniece Salines, FNP   1 year ago Annual physical exam   [facility] Berniece Salines, FNP   1 year ago Acute cystitis with hematuria   [facility] Berniece Salines, FNP       Future Appointments             In 3 days Zane Herald, Rudolpho Sevin, FNP [facility], [facility]

## 2023-01-13 ENCOUNTER — Encounter: Payer: Self-pay | Admitting: Nurse Practitioner

## 2023-01-13 ENCOUNTER — Other Ambulatory Visit (HOSPITAL_COMMUNITY)
Admission: RE | Admit: 2023-01-13 | Discharge: 2023-01-13 | Disposition: A | Discharge status: Home / Self Care | Payer: Federal, State, Local not specified - PPO | Source: Ambulatory Visit | Attending: Nurse Practitioner | Admitting: Nurse Practitioner

## 2023-01-13 ENCOUNTER — Other Ambulatory Visit: Payer: Self-pay

## 2023-01-13 ENCOUNTER — Ambulatory Visit (INDEPENDENT_AMBULATORY_CARE_PROVIDER_SITE_OTHER): Payer: Federal, State, Local not specified - PPO | Admitting: Nurse Practitioner

## 2023-01-13 VITALS — BP 118/72 | HR 96 | Temp 98.5°F | Resp 16 | Ht 66.0 in | Wt 190.9 lb

## 2023-01-13 DIAGNOSIS — Z124 Encounter for screening for malignant neoplasm of cervix: Secondary | ICD-10-CM | POA: Insufficient documentation

## 2023-01-13 DIAGNOSIS — E039 Hypothyroidism, unspecified: Secondary | ICD-10-CM

## 2023-01-13 DIAGNOSIS — Z130 Encounter for screening for diseases of the blood and blood-forming organs and certain disorders involving the immune mechanism: Secondary | ICD-10-CM | POA: Diagnosis not present

## 2023-01-13 DIAGNOSIS — Z0000 Encounter for general adult medical examination without abnormal findings: Secondary | ICD-10-CM | POA: Diagnosis not present

## 2023-01-13 DIAGNOSIS — Z131 Encounter for screening for diabetes mellitus: Secondary | ICD-10-CM | POA: Diagnosis not present

## 2023-01-13 DIAGNOSIS — Z13220 Encounter for screening for lipoid disorders: Secondary | ICD-10-CM | POA: Diagnosis not present

## 2023-01-13 NOTE — Progress Notes (Signed)
Name: Courtney Alvarez   MRN: [id number]    DOB: 11-20-1975   Date:01/13/2023       Progress Note  Subjective  Chief Complaint  Chief Complaint  Patient presents with   Annual Exam    HPI  Patient presents for annual CPE.  Diet: Regular, tries to eat well balanced Exercise: 4 days 60 minutes  Last Eye Exam: 01/01/2023 Last Dental Exam: 10/2022  Flowsheet Row Office Visit from 01/13/2023 in [facility]  AUDIT-C Score 1      Depression: Phq 9 is  negative    01/13/2023   10:28 AM 08/29/2022    8:54 AM 04/16/2022    3:07 PM 09/21/2021    3:30 PM 08/20/2021   10:45 AM  Depression screen PHQ 2/9  Decreased Interest 0 0 0 0 1  Down, Depressed, Hopeless 0 0 0 0 0  PHQ - 2 Score 0 0 0 0 1  Altered sleeping 0  0  0  Tired, decreased energy 0  0  0  Change in appetite 0  0  0  Feeling bad or failure about yourself  0  0  0  Trouble concentrating 0  0  0  Moving slowly or fidgety/restless 0  0  0  Suicidal thoughts 0  0  0  PHQ-9 Score 0  0  1  Difficult doing work/chores Not difficult at all  Not difficult at all     Hypertension: BP Readings from Last 3 Encounters:  01/13/23 118/72  08/29/22 120/72  04/16/22 120/80   Obesity: Wt Readings from Last 3 Encounters:  01/13/23 190 lb 14.4 oz (86.6 kg)  08/29/22 197 lb (89.4 kg)  04/16/22 202 lb 8 oz (91.9 kg)   BMI Readings from Last 3 Encounters:  01/13/23 30.81 kg/m  08/29/22 31.80 kg/m  04/16/22 32.68 kg/m     Vaccines:   Tdap: 10/02/2017 Flu: 01/08/2023 COVID-19:01/12/2020, 11/26/2019   Hep C Screening:  completed STD testing and prevention (HIV/chl/gon/syphilis): completed Intimate partner violence: negative screen  Sexual History : yes, IUD Menstrual History/LMP/Abnormal Bleeding: IUD Discussed importance of follow up if any post-menopausal bleeding: yes  Incontinence Symptoms: negative for symptoms   Breast cancer:  - Last Mammogram: 01/02/2023 - BRCA gene screening:  none  Osteoporosis Prevention : Discussed high calcium and vitamin D supplementation, weight bearing exercises Bone density :not applicable   Cervical cancer screening: due  Skin cancer: Discussed monitoring for atypical lesions  Colorectal cancer:   02/22/2022 Lung cancer:  Low Dose CT Chest recommended if Age 13-80 years, 20 pack-year currently smoking OR have quit w/in 15years. Patient does not qualify for screen   ECG: none  Advanced Care Planning: A voluntary discussion about advance care planning including the explanation and discussion of advance directives.  Discussed health care proxy and Living will, and the patient was able to identify a health care proxy as husband.  Patient does have a living will and power of attorney of health care   Lipids: Lab Results  Component Value Date   CHOL 198 08/20/2021   CHOL 206 (H) 01/02/2021   CHOL 208 (H) 08/27/2018   Lab Results  Component Value Date   HDL 72 08/20/2021   HDL 61 01/02/2021   HDL 73 08/27/2018   Lab Results  Component Value Date   LDLCALC 99 08/20/2021   LDLCALC 120 (H) 01/02/2021   LDLCALC 118 (H) 08/27/2018   Lab Results  Component Value Date   TRIG  167 (H) 08/20/2021   TRIG 141 01/02/2021   TRIG 80 08/27/2018   Lab Results  Component Value Date   CHOLHDL 2.8 08/20/2021   CHOLHDL 3.4 01/02/2021   CHOLHDL 2.8 08/27/2018   No results found for: "LDLDIRECT"  Glucose: Glucose, Bld  Date Value Ref Range Status  01/02/2021 88 65 - 99 mg/dL Final    Comment:    .            Fasting reference interval .   09/09/2019 89 65 - 99 mg/dL Final    Comment:    .            Fasting reference interval .   05/12/2018 100 (H) 65 - 99 mg/dL Final    Comment:    .            Fasting reference interval . For someone without known diabetes, a glucose value between 100 and 125 mg/dL is consistent with prediabetes and should be confirmed with a follow-up test. .     Patient Active Problem List    Diagnosis Date Noted   History of colonic polyps 02/22/2022   Polyp of colon 02/22/2022   Myopia of both eyes 07/15/2021   Right cervical radiculopathy 06/13/2021   Anxiety 01/30/2021   Cervicalgia 01/30/2021   Disorder of upper arm joint 01/30/2021   Lesion of ulnar nerve 01/30/2021   Nontoxic uninodular goiter 01/30/2021   Observation for suspected condition 01/30/2021   Other personal history presenting hazards to health 01/30/2021   Personal history of return from [employer] deployment 01/30/2021   Screening for malignant neoplasm of cervix 01/30/2021   Sinusitis 01/30/2021   Vitamin D deficiency 01/02/2021   Other hypersomnia 12/12/2020   IUD (intrauterine device) in place 05/11/2020   Anxiety with depression 09/09/2019   Gastroesophageal reflux disease without esophagitis 08/26/2018   Moderate episode of recurrent major depressive disorder (HCC) 08/26/2018   GAD (generalized anxiety disorder) 08/26/2018   Dry eyes 08/26/2018   Immune to varicella 06/01/2017   Family history of colonic polyps    Benign neoplasm of sigmoid colon    Family history of colon cancer 07/31/2015   Obesity, morbid (HCC) 07/31/2015   Loss of feeling or sensation 06/22/2015   Chronic pain in left foot 06/22/2015   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HCC) 12/16/2013   Arthralgia of multiple joints 12/16/2013   Goiter, nontoxic, 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   Adult hypothyroidism 09/07/2013    Past Surgical History:  Procedure Laterality Date   BLADDER SURGERY     CERVICAL BIOPSY  W/ LOOP ELECTRODE EXCISION     COLONOSCOPY WITH PROPOFOL N/A 01/05/2016   Procedure: COLONOSCOPY WITH PROPOFOL;  Surgeon: Midge Minium, MD;  Location[facility];  Service: Endoscopy;  Laterality: N/A;   COLONOSCOPY WITH PROPOFOL N/A 02/22/2022   Procedure: COLONOSCOPY WITH PROPOFOL;  Surgeon: Midge Minium, MD;  Location: [facility];  Service: Endoscopy;  Laterality: N/A;   FOOT SURGERY  Left    POLYPECTOMY  01/05/2016   Procedure: POLYPECTOMY;  Surgeon: Midge Minium, MD;  Location: [facility];  Service: Endoscopy;;   POLYPECTOMY N/A 02/22/2022   Procedure: POLYPECTOMY;  Surgeon: Midge Minium, MD;  Location: [facility];  Service: Endoscopy;  Laterality: N/A;    Family History  Problem Relation Age of Onset   Arthritis Mother    COPD Mother    Thyroid disease Mother    Arthritis Father    Hyperlipidemia Father    Cancer  Father        polyp removed   Heart disease Father    Hashimoto's thyroiditis Sister    Arthritis Maternal Grandmother    Cancer Maternal Grandmother        colon   Heart disease Maternal Grandmother    Thyroid disease Maternal Grandmother    Alcohol abuse Maternal Grandfather    Arthritis Maternal Grandfather    Arthritis Paternal Grandmother    Depression Paternal Grandmother    Hypertension Paternal Grandmother    Stroke Paternal Grandmother    Arthritis Paternal Grandfather    Heart disease Paternal Grandfather    Hypertension Paternal Grandfather    Breast cancer Other     Social History   Socioeconomic History   Marital status: Married    Spouse name: Jeannett Senior   Number of children: 1   Years of education: Not on file   Highest education level: Associate degree: academic program  Occupational History   Occupation: RN  Tobacco Use   Smoking status: Former    Current packs/day: 0.00    Types: Cigarettes    Quit date: 06/14/2003    Years since quitting: 19.5   Smokeless tobacco: Never   Tobacco comments:    quit 06/2003  Vaping Use   Vaping status: Never Used  Substance and Sexual Activity   Alcohol use: Yes    Alcohol/week: 0.0 standard drinks of alcohol    Comment: occasionally    Drug use: No   Sexual activity: Yes    Partners: Male    Birth control/protection: I.U.D.    Comment: mirena  Other Topics Concern   Not on file  Social History Narrative   Not on file   Social Determinants of Health    Financial Resource Strain: Low Risk  (01/13/2023)   Overall Financial Resource Strain (CARDIA)    Difficulty of Paying Living Expenses: Not hard at all  Food Insecurity: No Food Insecurity (01/13/2023)   Hunger Vital Sign    Worried About Running Out of Food in the Last Year: Never true    Ran Out of Food in the Last Year: Never true  Transportation Needs: No Transportation Needs (01/13/2023)   PRAPARE - Administrator, Civil Service (Medical): No    Lack of Transportation (Non-Medical): No  Physical Activity: Sufficiently Active (01/13/2023)   Exercise Vital Sign    Days of Exercise per Week: 4 days    Minutes of Exercise per Session: 60 min  Stress: No Stress Concern Present (01/13/2023)   [employer] of Occupational Health - Occupational Stress Questionnaire    Feeling of Stress : Only a little  Social Connections: Moderately Integrated (01/13/2023)   Social Connection and Isolation Panel [NHANES]    Frequency of Communication with Friends and Family: More than three times a week    Frequency of Social Gatherings with Friends and Family: More than three times a week    Attends Religious Services: More than 4 times per year    Active Member of [employer] or Organizations: No    Attends Banker Meetings: Never    Marital Status: Married  Catering manager Violence: Not At Risk (01/13/2023)   Humiliation, Afraid, Rape, and Kick questionnaire    Fear of Current or Ex-Partner: No    Emotionally Abused: No    Physically Abused: No    Sexually Abused: No     Current Outpatient Medications:    Acetaminophen 325 MG CAPS, , Disp: , Rfl:  amphetamine-dextroamphetamine (ADDERALL XR) 30 MG 24 hr capsule, Take 1 capsule (30 mg total) by mouth daily., Disp: 90 capsule, Rfl: 0   FLUoxetine HCl 60 MG TABS, Take 60 mg by mouth daily with breakfast., Disp: 90 tablet, Rfl: 0   lansoprazole (PREVACID) 15 MG capsule, , Disp: , Rfl:    Multiple Vitamins-Minerals  (MULTIVITAMIN GUMMIES WOMENS) CHEW, Chew 2 tablets by mouth daily. , Disp: , Rfl:    Omega-3 1000 MG CAPS, Take by mouth., Disp: , Rfl:    thyroid (NP THYROID) 120 MG tablet, Take 1 tablet (120 mg total) by mouth daily with breakfast. Patient needs an appointment for labs, Disp: 30 tablet, Rfl: 0   venlafaxine XR (EFFEXOR XR) 75 MG 24 hr capsule, Take 1 capsule (75 mg total) by mouth daily., Disp: 90 capsule, Rfl: 0   amphetamine-dextroamphetamine (ADDERALL XR) 30 MG 24 hr capsule, Take 1 capsule (30 mg total) by mouth daily., Disp: 90 capsule, Rfl: 0  No Known Allergies   ROS  Constitutional: Negative for fever or weight change.  Respiratory: Negative for cough and shortness of breath.   Cardiovascular: Negative for chest pain or palpitations.  Gastrointestinal: Negative for abdominal pain, no bowel changes.  Musculoskeletal: Negative for gait problem or joint swelling.  Skin: Negative for rash.  Neurological: Negative for dizziness or headache.  No other specific complaints in a complete review of systems (except as listed in HPI above).   Objective  Vitals:   01/13/23 1024  BP: 118/72  Pulse: 96  Resp: 16  Temp: 98.5 F (36.9 C)  TempSrc: Oral  SpO2: 98%  Weight: 190 lb 14.4 oz (86.6 kg)  Height: 5\' 6"  (1.676 m)    Body mass index is 30.81 kg/m.  Physical Exam Constitutional: Patient appears well-developed and well-nourished. No distress.  HENT: Head: Normocephalic and atraumatic. Ears: B TMs ok, no erythema or effusion; Nose: Nose normal. Mouth/Throat: Oropharynx is clear and moist. No oropharyngeal exudate.  Eyes: Conjunctivae and EOM are normal. Pupils are equal, round, and reactive to light. No scleral icterus.  Neck: Normal range of motion. Neck supple. No JVD present. No thyromegaly present.  Cardiovascular: Normal rate, regular rhythm and normal heart sounds.  No murmur heard. No BLE edema. Pulmonary/Chest: Effort normal and breath sounds normal. No respiratory  distress. Abdominal: Soft. Bowel sounds are normal, no distension. There is no tenderness. no masses Breast: no lumps or masses, no nipple discharge or rashes FEMALE GENITALIA:  External genitalia normal External urethra normal Vaginal vault normal without discharge or lesions Cervix normal without discharge or lesions Bimanual exam normal without masses IUD strings visualized RECTAL: no rectal masses or hemorrhoids Musculoskeletal: Normal range of motion, no joint effusions. No gross deformities Neurological: he is alert and oriented to person, place, and time. No cranial nerve deficit. Coordination, balance, strength, speech and gait are normal.  Skin: Skin is warm and dry. No rash noted. No erythema.  Psychiatric: Patient has a normal mood and affect. behavior is normal. Judgment and thought content normal.   No results found for this or any previous visit (from the past 2160 hour(s)).   Fall Risk:    01/13/2023   10:27 AM 08/29/2022    8:54 AM 04/16/2022    3:06 PM 09/21/2021    3:29 PM 08/20/2021   10:44 AM  Fall Risk   Falls in the past year? 0 0 0 0 0  Number falls in past yr: 0 0 0 0 0  Injury with Fall?  0 0 0 0 0  Risk for fall due to : No Fall Risks      Follow up Falls prevention discussed  Falls evaluation completed Falls evaluation completed Falls evaluation completed     Functional Status Survey: Is the patient deaf or have difficulty hearing?: No Does the patient have difficulty seeing, even when wearing glasses/contacts?: No Does the patient have difficulty concentrating, remembering, or making decisions?: No Does the patient have difficulty walking or climbing stairs?: No Does the patient have difficulty dressing or bathing?: No Does the patient have difficulty doing errands alone such as visiting a doctor's office or shopping?: No   Assessment & Plan  1. Annual physical exam  - CBC with Differential/Platelet - COMPLETE METABOLIC PANEL WITH GFR - Lipid  panel - Hemoglobin A1c - TSH - Cytology - PAP  2. Screening for diabetes mellitus  - COMPLETE METABOLIC PANEL WITH GFR - Hemoglobin A1c  3. Screening for deficiency anemia  - CBC with Differential/Platelet  4. Screening for cholesterol level  - Lipid panel  5. Adult hypothyroidism  - TSH  6. Screening for cervical cancer  - Cytology - PAP    -USPSTF grade A and B recommendations reviewed with patient; age-appropriate recommendations, preventive care, screening tests, etc discussed and encouraged; healthy living encouraged; see AVS for patient education given to patient -Discussed importance of 150 minutes of physical activity weekly, eat two servings of fish weekly, eat one serving of tree nuts ( cashews, pistachios, pecans, almonds.Marland Kitchen) every other day, eat 6 servings of fruit/vegetables daily and drink plenty of water and avoid sweet beverages.   -Reviewed Health Maintenance: Yes.

## 2023-01-14 ENCOUNTER — Other Ambulatory Visit: Payer: Self-pay | Admitting: Nurse Practitioner

## 2023-01-14 DIAGNOSIS — E039 Hypothyroidism, unspecified: Secondary | ICD-10-CM

## 2023-01-14 LAB — LIPID PANEL
Cholesterol: 214 mg/dL — ABNORMAL HIGH (ref <200)
HDL: 76 mg/dL (ref >=50)
LDL Cholesterol (Calc): 119 mg/dL — ABNORMAL HIGH
Non-HDL Cholesterol (Calc): 138 mg/dL — ABNORMAL HIGH (ref <130)
Total CHOL/HDL Ratio: 2.8 (calc) (ref <5.0)
Triglycerides: 87 mg/dL (ref <150)

## 2023-01-14 LAB — CBC WITH DIFFERENTIAL/PLATELET
Absolute Monocytes: 608 {cells}/uL (ref 200–950)
Basophils Absolute: 62 {cells}/uL (ref 0–200)
Basophils Relative: 1 %
Eosinophils Absolute: 81 {cells}/uL (ref 15–500)
Eosinophils Relative: 1.3 %
HCT: 42.1 % (ref 35.0–45.0)
Hemoglobin: 13.4 g/dL (ref 11.7–15.5)
Lymphs Abs: 1488 {cells}/uL (ref 850–3900)
MCH: 28 pg (ref 27.0–33.0)
MCHC: 31.8 g/dL — ABNORMAL LOW (ref 32.0–36.0)
MCV: 87.9 fL (ref 80.0–100.0)
MPV: 10.4 fL (ref 7.5–12.5)
Monocytes Relative: 9.8 %
Neutro Abs: 3962 {cells}/uL (ref 1500–7800)
Neutrophils Relative %: 63.9 %
Platelets: 313 10*3/uL (ref 140–400)
RBC: 4.79 10*6/uL (ref 3.80–5.10)
RDW: 12.5 % (ref 11.0–15.0)
Total Lymphocyte: 24 %
WBC: 6.2 10*3/uL (ref 3.8–10.8)

## 2023-01-14 LAB — COMPLETE METABOLIC PANEL WITH GFR
AG Ratio: 1.5 (calc) (ref 1.0–2.5)
ALT: 10 U/L (ref 6–29)
AST: 15 U/L (ref 10–35)
Albumin: 4.1 g/dL (ref 3.6–5.1)
Alkaline phosphatase (APISO): 75 U/L (ref 31–125)
BUN: 17 mg/dL (ref 7–25)
CO2: 29 mmol/L (ref 20–32)
Calcium: 9.3 mg/dL (ref 8.6–10.2)
Chloride: 101 mmol/L (ref 98–110)
Creat: 0.83 mg/dL (ref 0.50–0.99)
Globulin: 2.8 g/dL (ref 1.9–3.7)
Glucose, Bld: 92 mg/dL (ref 65–99)
Potassium: 4.4 mmol/L (ref 3.5–5.3)
Sodium: 137 mmol/L (ref 135–146)
Total Bilirubin: 0.5 mg/dL (ref 0.2–1.2)
Total Protein: 6.9 g/dL (ref 6.1–8.1)
eGFR: 87 mL/min/{1.73_m2} (ref >=60)

## 2023-01-14 LAB — HEMOGLOBIN A1C
Hgb A1c MFr Bld: 5.3 %{Hb} (ref <5.7)
Mean Plasma Glucose: 105 mg/dL
eAG (mmol/L): 5.8 mmol/L

## 2023-01-14 LAB — TSH: TSH: 0.33 m[IU]/L — ABNORMAL LOW

## 2023-01-14 NOTE — Progress Notes (Signed)
Patient notified

## 2023-01-15 LAB — CYTOLOGY - PAP
Adequacy: ABSENT
Chlamydia: NEGATIVE
Comment: NEGATIVE
Comment: NEGATIVE
Comment: NORMAL
Diagnosis: NEGATIVE
High risk HPV: NEGATIVE
Neisseria Gonorrhea: NEGATIVE

## 2023-01-22 ENCOUNTER — Telehealth: Payer: Self-pay | Admitting: Orthopedic Surgery

## 2023-01-22 NOTE — Telephone Encounter (Signed)
[employer] forms received. To [employer]. (CTR surgery 02/24/23)

## 2023-01-28 DIAGNOSIS — F331 Major depressive disorder, recurrent, moderate: Secondary | ICD-10-CM | POA: Diagnosis not present

## 2023-01-28 DIAGNOSIS — F411 Generalized anxiety disorder: Secondary | ICD-10-CM | POA: Diagnosis not present

## 2023-02-10 ENCOUNTER — Other Ambulatory Visit: Payer: Self-pay

## 2023-02-10 ENCOUNTER — Encounter: Payer: Self-pay | Admitting: Nurse Practitioner

## 2023-02-10 ENCOUNTER — Ambulatory Visit: Payer: Federal, State, Local not specified - PPO | Admitting: Nurse Practitioner

## 2023-02-10 VITALS — BP 118/72 | HR 89 | Temp 98.0°F | Resp 16 | Ht 66.0 in | Wt 189.1 lb

## 2023-02-10 DIAGNOSIS — B379 Candidiasis, unspecified: Secondary | ICD-10-CM

## 2023-02-10 DIAGNOSIS — E039 Hypothyroidism, unspecified: Secondary | ICD-10-CM | POA: Diagnosis not present

## 2023-02-10 DIAGNOSIS — T3695XA Adverse effect of unspecified systemic antibiotic, initial encounter: Secondary | ICD-10-CM | POA: Diagnosis not present

## 2023-02-10 DIAGNOSIS — J0140 Acute pansinusitis, unspecified: Secondary | ICD-10-CM | POA: Diagnosis not present

## 2023-02-10 MED ORDER — AMOXICILLIN-POT CLAVULANATE 875-125 MG PO TABS
1.0000 | ORAL_TABLET | Freq: Two times a day (BID) | ORAL | 0 refills | Status: DC
Start: 2023-02-10 — End: 2023-02-26

## 2023-02-10 MED ORDER — FLUCONAZOLE 150 MG PO TABS
150.0000 mg | ORAL_TABLET | ORAL | 0 refills | Status: DC | PRN
Start: 2023-02-10 — End: 2023-02-26

## 2023-02-10 NOTE — Progress Notes (Signed)
BP 118/72   Pulse 89   Temp 98 F (36.7 C) (Oral)   Resp 16   Ht 5\' 6"  (1.676 m)   Wt 189 lb 1.6 oz (85.8 kg)   SpO2 99%   BMI 30.52 kg/m    Subjective:    Patient ID: Courtney Alvarez, female    DOB: 01-Oct-1975, 47 y.o.   MRN: [id number]  HPI: Courtney Alvarez is a 47 y.o. female  Chief Complaint  Patient presents with  . Sinusitis  . Otalgia    For 1 month   Sinusitis Compliant: symptoms started a month ago, she has a history of sinus infections.  She has previously seen by ENT.  She says that was years ago.  She may need to be reevaluated by ENT,  patient will think about it.   -Fever: no -Cough: no -Shortness of breath: no -Wheezing: no -Chest congestion: no -Nasal congestion: yes -Runny nose: yes -Post nasal drip: no -Sore throat: no -Sinus pressure: yes -Headache: yes -Face pain: yes -Ear pain:  yes, especially the left ear -Ear pressure: yes -Relief with OTC cold/cough medications: yes   Recommend taking zyrtec, flonase, mucinex, vitamin d, vitamin c, and zinc. Push fluids and get rest.     Relevant past medical, surgical, family and social history reviewed and updated as indicated. Interim medical history since our last visit reviewed. Allergies and medications reviewed and updated.  Review of Systems  Ten systems reviewed and is negative except as mentioned in HPI       Objective:    BP 118/72   Pulse 89   Temp 98 F (36.7 C) (Oral)   Resp 16   Ht 5\' 6"  (1.676 m)   Wt 189 lb 1.6 oz (85.8 kg)   SpO2 99%   BMI 30.52 kg/m   Wt Readings from Last 3 Encounters:  02/10/23 189 lb 1.6 oz (85.8 kg)  01/13/23 190 lb 14.4 oz (86.6 kg)  08/29/22 197 lb (89.4 kg)    Physical Exam  Constitutional: Patient appears well-developed and well-nourished. Obese  No distress.  HEENT: head atraumatic, normocephalic, pupils equal and reactive to light, ears right TM clear, left tm red., neck supple, throat within normal limits, facial tenderness Cardiovascular:  Normal rate, regular rhythm and normal heart sounds.  No murmur heard. No BLE edema. Pulmonary/Chest: Effort normal and breath sounds normal. No respiratory distress. Abdominal: Soft.  There is no tenderness. Psychiatric: Patient has a normal mood and affect. behavior is normal. Judgment and thought content normal.  Results for orders placed or performed in visit on 01/13/23  CBC with Differential/Platelet  Result Value Ref Range   WBC 6.2 3.8 - 10.8 Thousand/uL   RBC 4.79 3.80 - 5.10 Million/uL   Hemoglobin 13.4 11.7 - 15.5 g/dL   HCT 40.9 81.1 - 91.4 %   MCV 87.9 80.0 - 100.0 fL   MCH 28.0 27.0 - 33.0 pg   MCHC 31.8 (L) 32.0 - 36.0 g/dL   RDW 78.2 95.6 - 21.3 %   Platelets 313 140 - 400 Thousand/uL   MPV 10.4 7.5 - 12.5 fL   Neutro Abs 3,962 1,500 - 7,800 cells/uL   Lymphs Abs 1,488 850 - 3,900 cells/uL   Absolute Monocytes 608 200 - 950 cells/uL   Eosinophils Absolute 81 15 - 500 cells/uL   Basophils Absolute 62 0 - 200 cells/uL   Neutrophils Relative % 63.9 %   Total Lymphocyte 24.0 %   Monocytes Relative 9.8 %  Eosinophils Relative 1.3 %   Basophils Relative 1.0 %  COMPLETE METABOLIC PANEL WITH GFR  Result Value Ref Range   Glucose, Bld 92 65 - 99 mg/dL   BUN 17 7 - 25 mg/dL   Creat 5.78 4.69 - 6.29 mg/dL   eGFR 87 > OR = 60 BM/WUX/3.24M0   BUN/Creatinine Ratio SEE NOTE: 6 - 22 (calc)   Sodium 137 135 - 146 mmol/L   Potassium 4.4 3.5 - 5.3 mmol/L   Chloride 101 98 - 110 mmol/L   CO2 29 20 - 32 mmol/L   Calcium 9.3 8.6 - 10.2 mg/dL   Total Protein 6.9 6.1 - 8.1 g/dL   Albumin 4.1 3.6 - 5.1 g/dL   Globulin 2.8 1.9 - 3.7 g/dL (calc)   AG Ratio 1.5 1.0 - 2.5 (calc)   Total Bilirubin 0.5 0.2 - 1.2 mg/dL   Alkaline phosphatase (APISO) 75 31 - 125 U/L   AST 15 10 - 35 U/L   ALT 10 6 - 29 U/L  Lipid panel  Result Value Ref Range   Cholesterol 214 (H) <200 mg/dL   HDL 76 > OR = 50 mg/dL   Triglycerides 87 <102 mg/dL   LDL Cholesterol (Calc) 119 (H) mg/dL (calc)    Total CHOL/HDL Ratio 2.8 <5.0 (calc)   Non-HDL Cholesterol (Calc) 138 (H) <130 mg/dL (calc)  Hemoglobin V2Z  Result Value Ref Range   Hgb A1c MFr Bld 5.3 <5.7 % of total Hgb   Mean Plasma Glucose 105 mg/dL   eAG (mmol/L) 5.8 mmol/L  TSH  Result Value Ref Range   TSH 0.33 (L) mIU/L  Cytology - PAP  Result Value Ref Range   High risk HPV Negative    Neisseria Gonorrhea Negative    Chlamydia Negative    Adequacy      Satisfactory for evaluation; transformation zone component ABSENT.   Diagnosis      - Negative for intraepithelial lesion or malignancy (NILM)   Comment Normal Reference Range HPV - Negative    Comment Normal Reference Ranger Chlamydia - Negative    Comment      Normal Reference Range Neisseria Gonorrhea - Negative      Assessment & Plan:   Problem List Items Addressed This Visit       Respiratory   Sinusitis - Primary   Relevant Medications   fluconazole (DIFLUCAN) 150 MG tablet   amoxicillin-clavulanate (AUGMENTIN) 875-125 MG tablet   Other Visit Diagnoses     Antibiotic-induced yeast infection       diflucan sent in   Relevant Medications   fluconazole (DIFLUCAN) 150 MG tablet        Follow up plan: No follow-ups on file.

## 2023-02-11 ENCOUNTER — Other Ambulatory Visit: Payer: Self-pay

## 2023-02-11 ENCOUNTER — Other Ambulatory Visit: Payer: Self-pay | Admitting: Nurse Practitioner

## 2023-02-11 DIAGNOSIS — E039 Hypothyroidism, unspecified: Secondary | ICD-10-CM

## 2023-02-11 DIAGNOSIS — F321 Major depressive disorder, single episode, moderate: Secondary | ICD-10-CM | POA: Diagnosis not present

## 2023-02-11 DIAGNOSIS — F411 Generalized anxiety disorder: Secondary | ICD-10-CM | POA: Diagnosis not present

## 2023-02-11 LAB — TSH: TSH: 1.71 m[IU]/L

## 2023-02-11 MED ORDER — FLUOXETINE HCL 60 MG PO TABS
1.0000 | ORAL_TABLET | Freq: Every day | ORAL | 0 refills | Status: DC
  Filled 2023-02-11: qty 90, 90d supply, fill #0

## 2023-02-11 MED ORDER — AMPHETAMINE-DEXTROAMPHET ER 30 MG PO CP24
30.0000 mg | ORAL_CAPSULE | Freq: Every day | ORAL | 0 refills | Status: DC
  Filled 2023-02-11: qty 90, 90d supply, fill #0

## 2023-02-11 MED ORDER — VRAYLAR 1.5 MG PO CAPS
1.5000 mg | ORAL_CAPSULE | ORAL | 0 refills | Status: DC
  Filled 2023-02-11: qty 30, 30d supply, fill #0

## 2023-02-11 MED ORDER — VENLAFAXINE HCL ER 75 MG PO CP24
75.0000 mg | ORAL_CAPSULE | Freq: Every day | ORAL | 0 refills | Status: DC
  Filled 2023-02-11: qty 90, 90d supply, fill #0

## 2023-02-12 ENCOUNTER — Other Ambulatory Visit: Payer: Self-pay

## 2023-02-12 MED FILL — Thyroid Tab 120 MG (2 Grain): ORAL | 90 days supply | Qty: 90 | Fill #0 | Status: AC

## 2023-02-12 NOTE — Telephone Encounter (Signed)
Refilled for a year based on note from Della Goo, FNP 02/11/2023 at 7:41 AM.   TSH done 02/10/2023 and to continue same treatment.   So lab appt. Is completed.  Requested Prescriptions  Pending Prescriptions Disp Refills   thyroid (NP THYROID) 120 MG tablet 90 tablet 3    Sig: Take 1 tablet (120 mg total) by mouth daily with breakfast. Patient needs an appointment for labs     Endocrinology:  Hypothyroid Agents Passed - 02/11/2023  3:26 PM      Passed - TSH in normal range and within 360 days    TSH  Date Value Ref Range Status  02/10/2023 1.71 mIU/L Final    Comment:              Reference Range .           > or = 20 Years  0.40-4.50 .                Pregnancy Ranges           First trimester    0.26-2.66           Second trimester   0.55-2.73           Third trimester    0.43-2.91          Passed - Valid encounter within last 12 months    Recent Outpatient Visits           2 days ago Acute non-recurrent pansinusitis   [facility] Berniece Salines, FNP   1 month ago Annual physical exam   [facility] Berniece Salines, FNP   5 months ago Acute recurrent pansinusitis   [facility] Berniece Salines, FNP   10 months ago Eczema, unspecified type   [facility] Berniece Salines, FNP   1 year ago Acute diffuse otitis externa of right ear   [facility] Berniece Salines, FNP

## 2023-02-13 ENCOUNTER — Other Ambulatory Visit: Payer: Self-pay

## 2023-02-14 ENCOUNTER — Other Ambulatory Visit: Payer: Self-pay

## 2023-02-14 DIAGNOSIS — F411 Generalized anxiety disorder: Secondary | ICD-10-CM | POA: Diagnosis not present

## 2023-02-14 DIAGNOSIS — F331 Major depressive disorder, recurrent, moderate: Secondary | ICD-10-CM | POA: Diagnosis not present

## 2023-02-18 DIAGNOSIS — F331 Major depressive disorder, recurrent, moderate: Secondary | ICD-10-CM | POA: Diagnosis not present

## 2023-02-18 DIAGNOSIS — F411 Generalized anxiety disorder: Secondary | ICD-10-CM | POA: Diagnosis not present

## 2023-02-21 ENCOUNTER — Encounter: Payer: Self-pay | Admitting: Nurse Practitioner

## 2023-02-21 ENCOUNTER — Other Ambulatory Visit: Payer: Self-pay | Admitting: Nurse Practitioner

## 2023-02-21 DIAGNOSIS — B379 Candidiasis, unspecified: Secondary | ICD-10-CM

## 2023-02-21 MED ORDER — NYSTATIN 100000 UNIT/GM EX CREA: 1.0000 | TOPICAL_CREAM | Freq: Two times a day (BID) | CUTANEOUS | 0 refills | Status: DC

## 2023-02-24 ENCOUNTER — Other Ambulatory Visit: Payer: Self-pay | Admitting: Surgical

## 2023-02-24 DIAGNOSIS — G5601 Carpal tunnel syndrome, right upper limb: Secondary | ICD-10-CM | POA: Diagnosis not present

## 2023-02-24 MED ORDER — TRAMADOL HCL 50 MG PO TABS
50.0000 mg | ORAL_TABLET | Freq: Four times a day (QID) | ORAL | 0 refills | Status: AC | PRN
Start: 2023-02-24 — End: 2024-02-24

## 2023-02-26 ENCOUNTER — Other Ambulatory Visit (HOSPITAL_COMMUNITY)
Admission: RE | Admit: 2023-02-26 | Discharge: 2023-02-26 | Disposition: A | Discharge status: Home / Self Care | Payer: Federal, State, Local not specified - PPO | Source: Ambulatory Visit | Attending: Nurse Practitioner | Admitting: Nurse Practitioner

## 2023-02-26 ENCOUNTER — Ambulatory Visit: Payer: Federal, State, Local not specified - PPO | Admitting: Nurse Practitioner

## 2023-02-26 ENCOUNTER — Other Ambulatory Visit: Payer: Self-pay

## 2023-02-26 VITALS — BP 118/72 | HR 94 | Temp 97.8°F | Resp 16 | Ht 66.0 in | Wt 193.4 lb

## 2023-02-26 DIAGNOSIS — B379 Candidiasis, unspecified: Secondary | ICD-10-CM | POA: Diagnosis not present

## 2023-02-26 NOTE — Progress Notes (Signed)
   BP 118/72   Pulse 94   Temp 97.8 F (36.6 C) (Oral)   Resp 16   Ht 5\' 6"  (1.676 m)   Wt 193 lb 6.4 oz (87.7 kg)   SpO2 98%   BMI 31.22 kg/m    Subjective:    Patient ID: Courtney Alvarez, female    DOB: 1975/12/09, 47 y.o.   MRN: [id number]  HPI: Courtney Alvarez is a 47 y.o. female  Chief Complaint  Patient presents with   Vaginitis    Recurrent yeast infection   VAGINAL DISCHARGE- patient has been treated for yeast after antibiotic treatment x2.  Patient reports she still has symptoms will get vaginal swab.  Treat accordingly. If still yeast will do extended treatment which includes fluconazole 150 mg every 72 hours x3 then 150 mg weekly for 6 months.  Duration: 2 months Discharge description: white  Pruritus: yes Dysuria: no Malodorous: no Urinary frequency: no Fevers: no Abdominal pain: no  Sexual activity: monogamous History of sexually transmitted diseases: no Recent antibiotic use: yes Context: recurrent yeast infections  Treatments attempted: antifungal , improved slightly but still not gone completely   Relevant past medical, surgical, family and social history reviewed and updated as indicated. Interim medical history since our last visit reviewed. Allergies and medications reviewed and updated.  Review of Systems  Ten systems reviewed and is negative except as mentioned in HPI       Objective:    BP 118/72   Pulse 94   Temp 97.8 F (36.6 C) (Oral)   Resp 16   Ht 5\' 6"  (1.676 m)   Wt 193 lb 6.4 oz (87.7 kg)   SpO2 98%   BMI 31.22 kg/m   Wt Readings from Last 3 Encounters:  02/26/23 193 lb 6.4 oz (87.7 kg)  02/10/23 189 lb 1.6 oz (85.8 kg)  01/13/23 190 lb 14.4 oz (86.6 kg)    Physical Exam  Constitutional: Patient appears well-developed and well-nourished.  No distress.  HEENT: head atraumatic, normocephalic, pupils equal and reactive to light, neck supple Cardiovascular: Normal rate, regular rhythm and normal heart sounds.  No murmur heard. No  BLE edema. Pulmonary/Chest: Effort normal and breath sounds normal. No respiratory distress. Abdominal: Soft.  There is no tenderness. Psychiatric: Patient has a normal mood and affect. behavior is normal. Judgment and thought content normal.  Results for orders placed or performed in visit on 01/14/23  TSH  Result Value Ref Range   TSH 1.71 mIU/L      Assessment & Plan:   Problem List Items Addressed This Visit   None Visit Diagnoses     Yeast infection    -  Primary   vaginal swab,  will treat as indicated   Relevant Orders   Cervicovaginal ancillary only        Follow up plan: Return if symptoms worsen or fail to improve.

## 2023-02-27 ENCOUNTER — Other Ambulatory Visit: Payer: Self-pay

## 2023-02-27 DIAGNOSIS — F321 Major depressive disorder, single episode, moderate: Secondary | ICD-10-CM | POA: Diagnosis not present

## 2023-02-27 DIAGNOSIS — F411 Generalized anxiety disorder: Secondary | ICD-10-CM | POA: Diagnosis not present

## 2023-02-27 MED ORDER — VRAYLAR 1.5 MG PO CAPS
1.5000 mg | ORAL_CAPSULE | Freq: Every day | ORAL | 1 refills | Status: DC
  Filled 2023-02-27 – 2023-03-11 (×2): qty 30, 30d supply, fill #0
  Filled 2023-04-13: qty 30, 30d supply, fill #1

## 2023-02-28 LAB — CERVICOVAGINAL ANCILLARY ONLY
Bacterial Vaginitis (gardnerella): NEGATIVE
Candida Glabrata: NEGATIVE
Candida Vaginitis: NEGATIVE
Chlamydia: NEGATIVE
Comment: NEGATIVE
Comment: NEGATIVE
Comment: NEGATIVE
Comment: NEGATIVE
Comment: NEGATIVE
Comment: NORMAL
Neisseria Gonorrhea: NEGATIVE
Trichomonas: NEGATIVE

## 2023-03-07 ENCOUNTER — Encounter: Payer: Federal, State, Local not specified - PPO | Admitting: Surgical

## 2023-03-10 ENCOUNTER — Encounter: Payer: Self-pay | Admitting: Surgical

## 2023-03-10 ENCOUNTER — Ambulatory Visit (INDEPENDENT_AMBULATORY_CARE_PROVIDER_SITE_OTHER): Payer: Federal, State, Local not specified - PPO | Admitting: Surgical

## 2023-03-10 DIAGNOSIS — G5601 Carpal tunnel syndrome, right upper limb: Secondary | ICD-10-CM

## 2023-03-10 NOTE — Progress Notes (Signed)
Post-Op Visit Note   Patient: Courtney Alvarez           Date of Birth: [birth year]           MRN: [id number] Visit Date: 03/10/2023 PCP: Berniece Salines, FNP   Assessment & Plan:  Chief Complaint:  Chief Complaint  Patient presents with   Right Hand - Routine Post Op    02/24/2023 Right CTR   Visit Diagnoses:  1. Carpal tunnel syndrome, right upper limb     Plan: Patient is a 47 year old female who presents s/p right carpal tunnel release on 02/24/2023.  Patient is doing well and feels "amazing".  No numbness or tingling except at the incision but this is improving.  She has no fevers or chills.  No drainage from the incision.  Sutures are intact.  She has felt more numbness and tingling in the left hand now.  On exam, patient has intact EPL, FPL, finger abduction function to both hands.  Incision looks to be healing well with sutures intact.  No stressful drainage.  Sutures removed and replaced with Steri-Strips today with no residual dehiscence of the incision.  On exam of the left hand, patient does have positive Tinel sign over the carpal tunnel and positive Durkan sign over the carpal tunnel.  There is no weakness with grip strength testing.  No significant atrophy of the thenar musculature.  Plan is still hold off on any lifting with the operative hand and we will see how the left hand trends over the next 3 weeks.  Follow-up in 3 weeks for clinical recheck with Dr. August Saucer and if the left hand is still symptomatic (which it has not been in the past) we can get nerve study for the left arm.   Follow-Up Instructions: No follow-ups on file.   Orders:  No orders of the defined types were placed in this encounter.  No orders of the defined types were placed in this encounter.   Imaging: No results found.  PMFS History: Patient Active Problem List   Diagnosis Date Noted   History of colonic polyps 02/22/2022   Polyp of colon 02/22/2022   Myopia of both eyes 07/15/2021   Right  cervical radiculopathy 06/13/2021   Anxiety 01/30/2021   Cervicalgia 01/30/2021   Disorder of upper arm joint 01/30/2021   Lesion of ulnar nerve 01/30/2021   Nontoxic uninodular goiter 01/30/2021   Observation for suspected condition 01/30/2021   Other personal history presenting hazards to health 01/30/2021   Personal history of return from [employer] deployment 01/30/2021   Screening for malignant neoplasm of cervix 01/30/2021   Sinusitis 01/30/2021   Vitamin D deficiency 01/02/2021   Other hypersomnia 12/12/2020   IUD (intrauterine device) in place 05/11/2020   Anxiety with depression 09/09/2019   Gastroesophageal reflux disease without esophagitis 08/26/2018   Moderate episode of recurrent major depressive disorder (HCC) 08/26/2018   GAD (generalized anxiety disorder) 08/26/2018   Dry eyes 08/26/2018   Immune to varicella 06/01/2017   Family history of colonic polyps    Benign neoplasm of sigmoid colon    Family history of colon cancer 07/31/2015   Obesity, morbid (HCC) 07/31/2015   Loss of feeling or sensation 06/22/2015   Chronic pain in left foot 06/22/2015   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HCC) 12/16/2013   Arthralgia of multiple joints 12/16/2013   Goiter, nontoxic, 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   Adult hypothyroidism 09/07/2013   Past Medical History:  Diagnosis Date   Arthritis  Chronic fatigue syndrome    GERD (gastroesophageal reflux disease)    Headache    tension/cluster   Hypothyroidism    Motion sickness    Plantar fasciitis of left foot    PONV (postoperative nausea and vomiting)    Thyroid disease    Vaginal Pap smear, abnormal     Family History  Problem Relation Age of Onset   Arthritis Mother    COPD Mother    Thyroid disease Mother    Arthritis Father    Hyperlipidemia Father    Cancer Father        polyp removed   Heart disease Father    Hashimoto's thyroiditis Sister    Arthritis Maternal Grandmother    Cancer  Maternal Grandmother        colon   Heart disease Maternal Grandmother    Thyroid disease Maternal Grandmother    Alcohol abuse Maternal Grandfather    Arthritis Maternal Grandfather    Arthritis Paternal Grandmother    Depression Paternal Grandmother    Hypertension Paternal Grandmother    Stroke Paternal Grandmother    Arthritis Paternal Grandfather    Heart disease Paternal Grandfather    Hypertension Paternal Grandfather    Breast cancer Other     Past Surgical History:  Procedure Laterality Date   BLADDER SURGERY     CERVICAL BIOPSY  W/ LOOP ELECTRODE EXCISION     COLONOSCOPY WITH PROPOFOL N/A 01/05/2016   Procedure: COLONOSCOPY WITH PROPOFOL;  Surgeon: Midge Minium, MD;  [facility];  Service: Endoscopy;  Laterality: N/A;   COLONOSCOPY WITH PROPOFOL N/A 02/22/2022   Procedure: COLONOSCOPY WITH PROPOFOL;  Surgeon: Midge Minium, MD;  Location: [facility];  Service: Endoscopy;  Laterality: N/A;   FOOT SURGERY Left    POLYPECTOMY  01/05/2016   Procedure: POLYPECTOMY;  Surgeon: Midge Minium, MD;  Location: [facility];  Service: Endoscopy;;   POLYPECTOMY N/A 02/22/2022   Procedure: POLYPECTOMY;  Surgeon: Midge Minium, MD;  Location: [facility];  Service: Endoscopy;  Laterality: N/A;   Social History   Occupational History   Occupation: RN  Tobacco Use   Smoking status: Former    Current packs/day: 0.00    Types: Cigarettes    Quit date: 06/14/2003    Years since quitting: 19.7   Smokeless tobacco: Never   Tobacco comments:    quit 06/2003  Vaping Use   Vaping status: Never Used  Substance and Sexual Activity   Alcohol use: Yes    Alcohol/week: 0.0 standard drinks of alcohol    Comment: occasionally    Drug use: No   Sexual activity: Yes    Partners: Male    Birth control/protection: I.U.D.    Comment: mirena

## 2023-03-11 ENCOUNTER — Other Ambulatory Visit: Payer: Self-pay

## 2023-03-11 ENCOUNTER — Other Ambulatory Visit (HOSPITAL_COMMUNITY): Payer: Self-pay

## 2023-03-11 ENCOUNTER — Encounter: Payer: Self-pay | Admitting: Nurse Practitioner

## 2023-03-11 ENCOUNTER — Ambulatory Visit: Payer: Federal, State, Local not specified - PPO | Admitting: Nurse Practitioner

## 2023-03-11 VITALS — BP 118/82 | HR 94 | Temp 98.0°F | Resp 18 | Ht 66.0 in | Wt 195.3 lb

## 2023-03-11 DIAGNOSIS — R350 Frequency of micturition: Secondary | ICD-10-CM | POA: Diagnosis not present

## 2023-03-11 LAB — POCT URINALYSIS DIPSTICK
Bilirubin, UA: NEGATIVE
Glucose, UA: NEGATIVE
Ketones, UA: NEGATIVE
Nitrite, UA: NEGATIVE
Odor: NORMAL
Protein, UA: NEGATIVE
Spec Grav, UA: 1.015 (ref 1.010–1.025)
Urobilinogen, UA: 0.2 U/dL
pH, UA: 6 (ref 5.0–8.0)

## 2023-03-11 MED ORDER — CEPHALEXIN 500 MG PO CAPS
500.0000 mg | ORAL_CAPSULE | Freq: Two times a day (BID) | ORAL | 0 refills | Status: AC
  Filled 2023-03-11: qty 10, 5d supply, fill #0

## 2023-03-11 MED ORDER — FLUCONAZOLE 150 MG PO TABS
150.0000 mg | ORAL_TABLET | ORAL | 0 refills | Status: DC | PRN
  Filled 2023-03-11: qty 2, 6d supply, fill #0

## 2023-03-11 NOTE — Progress Notes (Signed)
BP 118/82   Pulse 94   Temp 98 F (36.7 C)   Resp 18   Ht 5\' 6"  (1.676 m)   Wt 195 lb 4.8 oz (88.6 kg)   SpO2 98%   BMI 31.52 kg/m    Subjective:    Patient ID: Courtney Alvarez, female    DOB: 1975-06-05, 47 y.o.   MRN: [id number]  HPI: Courtney Alvarez is a 47 y.o. female  Chief Complaint  Patient presents with   Urinary Frequency   Discussed the use of AI scribe software for clinical note transcription with the patient, who gave verbal consent to proceed.  History of Present Illness   The patient, with a history of urinary tract infections (UTIs) and yeast infections, presents with urinary frequency, urgency, and dysuria. The symptoms began on Friday and have been severe enough to cause incontinence. She has been self-treating with over-the-counter Pyridium, which has provided some relief. She denies fever, lower abdominal pain, and back pain. She also reports occasional vaginal itching. The patient has a history of bladder problems and a third ureter, but has not had any issues since undergoing surgery at age 36. She is now 47 years old.   Relevant past medical, surgical, family and social history reviewed and updated as indicated. Interim medical history since our last visit reviewed. Allergies and medications reviewed and updated.  Review of Systems  Ten systems reviewed and is negative except as mentioned in HPI       Objective:    BP 118/82   Pulse 94   Temp 98 F (36.7 C)   Resp 18   Ht 5\' 6"  (1.676 m)   Wt 195 lb 4.8 oz (88.6 kg)   SpO2 98%   BMI 31.52 kg/m   Wt Readings from Last 3 Encounters:  03/11/23 195 lb 4.8 oz (88.6 kg)  02/26/23 193 lb 6.4 oz (87.7 kg)  02/10/23 189 lb 1.6 oz (85.8 kg)    Physical Exam  Constitutional: Patient appears well-developed and well-nourished. Obese  No distress.  HEENT: head atraumatic, normocephalic, pupils equal and reactive to light, neck supple Cardiovascular: Normal rate, regular rhythm and normal heart sounds.  No  murmur heard. No BLE edema. Pulmonary/Chest: Effort normal and breath sounds normal. No respiratory distress. Abdominal: Soft.  There is no tenderness. No CVA tenderness Psychiatric: Patient has a normal mood and affect. behavior is normal. Judgment and thought content normal.  Results for orders placed or performed in visit on 03/11/23  POCT urinalysis dipstick  Result Value Ref Range   Color, UA iyellow    Clarity, UA clear    Glucose, UA Negative Negative   Bilirubin, UA neg    Ketones, UA neg    Spec Grav, UA 1.015 1.010 - 1.025   Blood, UA trace    pH, UA 6.0 5.0 - 8.0   Protein, UA Negative Negative   Urobilinogen, UA 0.2 0.2 or 1.0 E.U./dL   Nitrite, UA neg    Leukocytes, UA Moderate (2+) (A) Negative   Appearance clear    Odor normal       Assessment & Plan:   Problem List Items Addressed This Visit   None Visit Diagnoses     Frequent urination    -  Primary   Relevant Medications   cephALEXin (KEFLEX) 500 MG capsule   fluconazole (DIFLUCAN) 150 MG tablet   Other Relevant Orders   POCT urinalysis dipstick (Completed)   Urine Culture  Assessment and Plan    Urinary Symptoms Frequent urination, burning, and spasms suggestive of UTI. Urinalysis showed trace blood. History of bladder problems. Symptoms improved with over-the-counter Pyridium. -Treat as UTI and send urine for culture. -Continue Pyridium for bladder spasms. -push fluids -If symptoms persist after treatment, consider trial of estrogen cream. -If all options exhausted and symptoms persist, consider referral to urologist.  Vaginal Itching History of recent yeast infection treated with Diflucan. Current itching may be residual or recurrent. -Send Diflucan prescription to pharmacy in case needed.  General Health Maintenance -Encourage fluid intake.        Follow up plan: No follow-ups on file.

## 2023-03-13 LAB — URINE CULTURE
MICRO NUMBER:: 15782847
SPECIMEN QUALITY:: ADEQUATE

## 2023-03-14 DIAGNOSIS — F411 Generalized anxiety disorder: Secondary | ICD-10-CM | POA: Diagnosis not present

## 2023-03-14 DIAGNOSIS — F331 Major depressive disorder, recurrent, moderate: Secondary | ICD-10-CM | POA: Diagnosis not present

## 2023-03-17 ENCOUNTER — Other Ambulatory Visit: Payer: Self-pay | Admitting: Nurse Practitioner

## 2023-03-17 DIAGNOSIS — N3000 Acute cystitis without hematuria: Secondary | ICD-10-CM

## 2023-03-17 DIAGNOSIS — F331 Major depressive disorder, recurrent, moderate: Secondary | ICD-10-CM | POA: Diagnosis not present

## 2023-03-17 DIAGNOSIS — F411 Generalized anxiety disorder: Secondary | ICD-10-CM | POA: Diagnosis not present

## 2023-03-17 MED ORDER — NITROFURANTOIN MONOHYD MACRO 100 MG PO CAPS
100.0000 mg | ORAL_CAPSULE | Freq: Two times a day (BID) | ORAL | 0 refills | Status: DC
Start: 2023-03-17 — End: 2024-02-25

## 2023-03-31 ENCOUNTER — Encounter: Payer: Self-pay | Admitting: Orthopedic Surgery

## 2023-03-31 ENCOUNTER — Ambulatory Visit (INDEPENDENT_AMBULATORY_CARE_PROVIDER_SITE_OTHER): Payer: Federal, State, Local not specified - PPO | Admitting: Orthopedic Surgery

## 2023-03-31 DIAGNOSIS — G5601 Carpal tunnel syndrome, right upper limb: Secondary | ICD-10-CM

## 2023-03-31 NOTE — Progress Notes (Signed)
Post-Op Visit Note   Patient: Courtney Alvarez           Date of Birth: February 25, 1976           MRN: [id number] Visit Date: 03/31/2023 PCP: Berniece Salines, FNP   Assessment & Plan:  Chief Complaint:  Chief Complaint  Patient presents with   Right Hand - Routine Post Op   Visit Diagnoses:  1. Carpal tunnel syndrome, right upper limb     Plan: Ronnita is a patient who is now several weeks out from right carpal tunnel syndrome.  She is doing well.  On examination she has good grip strength and well-healed incision.  Would let her go back to work tomorrow and follow-up with [place] as needed.  Do not really want her putting direct pressure on that hand when she is working out in terms of push-ups or carrying weights.  She is happy with her symptomatic result at this time.  Left hand is becoming less symptomatic with a night splint  Follow-Up Instructions: No follow-ups on file.   Orders:  No orders of the defined types were placed in this encounter.  No orders of the defined types were placed in this encounter.   Imaging: No results found.  PMFS History: Patient Active Problem List   Diagnosis Date Noted   History of colonic polyps 02/22/2022   Polyp of colon 02/22/2022   Myopia of both eyes 07/15/2021   Right cervical radiculopathy 06/13/2021   Anxiety 01/30/2021   Cervicalgia 01/30/2021   Disorder of upper arm joint 01/30/2021   Lesion of ulnar nerve 01/30/2021   Nontoxic uninodular goiter 01/30/2021   Observation for suspected condition 01/30/2021   Other personal history presenting hazards to health 01/30/2021   Personal history of return from [employer] deployment 01/30/2021   Screening for malignant neoplasm of cervix 01/30/2021   Sinusitis 01/30/2021   Vitamin D deficiency 01/02/2021   Other hypersomnia 12/12/2020   IUD (intrauterine device) in place 05/11/2020   Anxiety with depression 09/09/2019   Gastroesophageal reflux disease without esophagitis 08/26/2018    Moderate episode of recurrent major depressive disorder (HCC) 08/26/2018   GAD (generalized anxiety disorder) 08/26/2018   Dry eyes 08/26/2018   Immune to varicella 06/01/2017   Family history of colonic polyps    Benign neoplasm of sigmoid colon    Family history of colon cancer 07/31/2015   Obesity, morbid (HCC) 07/31/2015   Loss of feeling or sensation 06/22/2015   Chronic pain in left foot 06/22/2015   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HCC) 12/16/2013   Arthralgia of multiple joints 12/16/2013   Goiter, nontoxic, 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   Adult hypothyroidism 09/07/2013   Past Medical History:  Diagnosis Date   Arthritis    Chronic fatigue syndrome    GERD (gastroesophageal reflux disease)    Headache    tension/cluster   Hypothyroidism    Motion sickness    Plantar fasciitis of left foot    PONV (postoperative nausea and vomiting)    Thyroid disease    Vaginal Pap smear, abnormal     Family History  Problem Relation Age of Onset   Arthritis Mother    COPD Mother    Thyroid disease Mother    Arthritis Father    Hyperlipidemia Father    Cancer Father        polyp removed   Heart disease Father    Hashimoto's thyroiditis Sister    Arthritis Maternal Grandmother  Cancer Maternal Grandmother        colon   Heart disease Maternal Grandmother    Thyroid disease Maternal Grandmother    Alcohol abuse Maternal Grandfather    Arthritis Maternal Grandfather    Arthritis Paternal Grandmother    Depression Paternal Grandmother    Hypertension Paternal Grandmother    Stroke Paternal Grandmother    Arthritis Paternal Grandfather    Heart disease Paternal Grandfather    Hypertension Paternal Grandfather    Breast cancer Other     Past Surgical History:  Procedure Laterality Date   BLADDER SURGERY     CERVICAL BIOPSY  W/ LOOP ELECTRODE EXCISION     COLONOSCOPY WITH PROPOFOL N/A 01/05/2016   Procedure: COLONOSCOPY WITH PROPOFOL;  Surgeon: Midge Minium, MD;  Location: [facility];  Service: Endoscopy;  Laterality: N/A;   COLONOSCOPY WITH PROPOFOL N/A 02/22/2022   Procedure: COLONOSCOPY WITH PROPOFOL;  Surgeon: Midge Minium, MD;  Location: [facility];  Service: Endoscopy;  Laterality: N/A;   FOOT SURGERY Left    POLYPECTOMY  01/05/2016   Procedure: POLYPECTOMY;  Surgeon: Midge Minium, MD;  Location: [facility];  Service: Endoscopy;;   POLYPECTOMY N/A 02/22/2022   Procedure: POLYPECTOMY;  Surgeon: Midge Minium, MD;  Location: [facility];  Service: Endoscopy;  Laterality: N/A;   Social History   Occupational History   Occupation: RN  Tobacco Use   Smoking status: Former    Current packs/day: 0.00    Types: Cigarettes    Quit date: 06/14/2003    Years since quitting: 19.8   Smokeless tobacco: Never   Tobacco comments:    quit 06/2003  Vaping Use   Vaping status: Never Used  Substance and Sexual Activity   Alcohol use: Yes    Alcohol/week: 0.0 standard drinks of alcohol    Comment: occasionally    Drug use: No   Sexual activity: Yes    Partners: Male    Birth control/protection: I.U.D.    Comment: mirena

## 2023-04-02 DIAGNOSIS — F411 Generalized anxiety disorder: Secondary | ICD-10-CM | POA: Diagnosis not present

## 2023-04-02 DIAGNOSIS — F331 Major depressive disorder, recurrent, moderate: Secondary | ICD-10-CM | POA: Diagnosis not present

## 2023-04-13 ENCOUNTER — Other Ambulatory Visit: Payer: Self-pay

## 2023-04-14 ENCOUNTER — Other Ambulatory Visit: Payer: Self-pay

## 2023-04-14 ENCOUNTER — Ambulatory Visit: Payer: Federal, State, Local not specified - PPO | Admitting: Nurse Practitioner

## 2023-04-14 ENCOUNTER — Encounter: Payer: Self-pay | Admitting: Nurse Practitioner

## 2023-04-14 VITALS — BP 122/76 | HR 109 | Temp 98.0°F | Resp 16 | Ht 66.0 in | Wt 198.0 lb

## 2023-04-14 DIAGNOSIS — J069 Acute upper respiratory infection, unspecified: Secondary | ICD-10-CM

## 2023-04-14 MED ORDER — AZITHROMYCIN 250 MG PO TABS: ORAL_TABLET | ORAL | 0 refills | Status: AC

## 2023-04-14 MED ORDER — HYDROCOD POLI-CHLORPHE POLI ER 10-8 MG/5ML PO SUER: 5.0000 mL | Freq: Two times a day (BID) | ORAL | 0 refills | Status: DC | PRN

## 2023-04-14 NOTE — Progress Notes (Signed)
BP 122/76   Pulse (!) 109   Temp 98 F (36.7 C) (Oral)   Resp 16   Ht 5\' 6"  (1.676 m)   Wt 198 lb (89.8 kg)   SpO2 98%   BMI 31.96 kg/m    Subjective:    Patient ID: Courtney Alvarez, female    DOB: 10/09/1975, 47 y.o.   MRN: [id number]  HPI: Courtney Alvarez is a 47 y.o. female  Chief Complaint  Patient presents with   Fatigue   Cough    Productive, since the 17th. Feels like getting a "little" worse.   Nasal Congestion    Discussed the use of AI scribe software for clinical note transcription with the patient, who gave verbal consent to proceed.  History of Present Illness   The patient, with a history of sinus issues, presents with worsening upper respiratory symptoms that began with a sore throat and progressed to a cough, laryngitis, and bronchitis-like symptoms. She reports increased congestion and difficulty expectorating. She has been managing symptoms with Sudafed and Mucinex.       03/11/2023   10:13 AM 03/11/2023   10:10 AM 02/26/2023    2:09 PM  Depression screen PHQ 2/9  Decreased Interest 0 0 0  Down, Depressed, Hopeless 0 0 0  PHQ - 2 Score 0 0 0  Altered sleeping 0    Tired, decreased energy 0    Change in appetite 0    Feeling bad or failure about yourself  0    Trouble concentrating 0    Moving slowly or fidgety/restless 0    Suicidal thoughts 0    PHQ-9 Score 0    Difficult doing work/chores Not difficult at all      Relevant past medical, surgical, family and social history reviewed and updated as indicated. Interim medical history since our last visit reviewed. Allergies and medications reviewed and updated.  Review of Systems  Ten systems reviewed and is negative except as mentioned in HPI       Objective:    BP 122/76   Pulse (!) 109   Temp 98 F (36.7 C) (Oral)   Resp 16   Ht 5\' 6"  (1.676 m)   Wt 198 lb (89.8 kg)   SpO2 98%   BMI 31.96 kg/m    Wt Readings from Last 3 Encounters:  04/14/23 198 lb (89.8 kg)  03/11/23 195 lb  4.8 oz (88.6 kg)  02/26/23 193 lb 6.4 oz (87.7 kg)    Physical Exam  Constitutional: Patient appears well-developed and well-nourished. Obese  No distress.  HEENT: head atraumatic, normocephalic, pupils equal and reactive to light, ears TMs clear, neck supple, throat within normal limits Cardiovascular: Normal rate, regular rhythm and normal heart sounds.  No murmur heard. No BLE edema. Pulmonary/Chest: Effort normal and breath sounds normal. No respiratory distress. Abdominal: Soft.  There is no tenderness. Psychiatric: Patient has a normal mood and affect. behavior is normal. Judgment and thought content normal.       Assessment & Plan:   Problem List Items Addressed This Visit   None Visit Diagnoses       Viral upper respiratory tract infection    -  Primary   Relevant Medications   azithromycin (ZITHROMAX) 250 MG tablet   chlorpheniramine-HYDROcodone (TUSSIONEX) 10-8 MG/5ML        Assessment and Plan    Upper Respiratory Infection/walking pneumonia Symptoms of sore throat, cough, laryngitis, and congestion. No signs of pneumonia on auscultation, but  given the prevalence of walking pneumonia, decision made to treat empirically. -Start Azithromycin (Z-Pak) and monitor for worsening symptoms. If symptoms worsen, obtain chest x-ray. -Prescribe cough syrup, to be used primarily at night due to potential sedative effects. -continue mucinex, flonase, zyrtec -push fluids and get rest  Chronic Sinus Issues Ongoing sinus congestion and discomfort. -Continue Flonase, allergy medication, and Mucinex. Maintain hydration.        Follow up plan: Return if symptoms worsen or fail to improve.

## 2023-04-15 ENCOUNTER — Encounter: Payer: Self-pay | Admitting: Nurse Practitioner

## 2023-04-17 ENCOUNTER — Other Ambulatory Visit: Payer: Self-pay

## 2023-04-18 ENCOUNTER — Other Ambulatory Visit: Payer: Self-pay

## 2023-04-22 ENCOUNTER — Other Ambulatory Visit: Payer: Self-pay

## 2023-04-22 DIAGNOSIS — F321 Major depressive disorder, single episode, moderate: Secondary | ICD-10-CM | POA: Diagnosis not present

## 2023-04-22 DIAGNOSIS — F411 Generalized anxiety disorder: Secondary | ICD-10-CM | POA: Diagnosis not present

## 2023-04-22 MED ORDER — VENLAFAXINE HCL ER 75 MG PO CP24
75.0000 mg | ORAL_CAPSULE | Freq: Every day | ORAL | 0 refills | Status: DC
  Filled 2023-04-22 – 2023-05-07 (×2): qty 90, 90d supply, fill #0

## 2023-04-22 MED ORDER — VRAYLAR 1.5 MG PO CAPS: 1.5000 mg | ORAL_CAPSULE | Freq: Every day | ORAL | 2 refills | Status: DC

## 2023-04-22 MED ORDER — VRAYLAR 1.5 MG PO CAPS
1.5000 mg | ORAL_CAPSULE | Freq: Every day | ORAL | 2 refills | Status: DC
  Filled 2023-04-22 – 2023-05-22 (×2): qty 30, 30d supply, fill #0
  Filled 2023-07-03: qty 30, 30d supply, fill #1

## 2023-04-22 MED ORDER — AMPHETAMINE-DEXTROAMPHET ER 30 MG PO CP24
30.0000 mg | ORAL_CAPSULE | Freq: Every day | ORAL | 0 refills | Status: DC
  Filled 2023-04-29 – 2023-05-22 (×3): qty 90, 90d supply, fill #0

## 2023-04-22 MED ORDER — FLUOXETINE HCL 60 MG PO TABS
60.0000 mg | ORAL_TABLET | Freq: Every day | ORAL | 0 refills | Status: DC
  Filled 2023-04-22: qty 90, 90d supply, fill #0

## 2023-04-29 ENCOUNTER — Other Ambulatory Visit: Payer: Self-pay

## 2023-05-05 ENCOUNTER — Other Ambulatory Visit: Payer: Self-pay

## 2023-05-06 ENCOUNTER — Other Ambulatory Visit: Payer: Self-pay

## 2023-05-07 ENCOUNTER — Other Ambulatory Visit: Payer: Self-pay

## 2023-05-09 DIAGNOSIS — F331 Major depressive disorder, recurrent, moderate: Secondary | ICD-10-CM | POA: Diagnosis not present

## 2023-05-09 DIAGNOSIS — F411 Generalized anxiety disorder: Secondary | ICD-10-CM | POA: Diagnosis not present

## 2023-05-22 ENCOUNTER — Other Ambulatory Visit: Payer: Self-pay

## 2023-05-22 ENCOUNTER — Encounter: Payer: Self-pay | Admitting: Nurse Practitioner

## 2023-05-22 ENCOUNTER — Other Ambulatory Visit: Payer: Self-pay | Admitting: Nurse Practitioner

## 2023-05-22 MED FILL — Thyroid Tab 120 MG (2 Grain): ORAL | 90 days supply | Qty: 90 | Fill #1 | Status: CN

## 2023-05-23 ENCOUNTER — Other Ambulatory Visit: Payer: Self-pay | Admitting: Nurse Practitioner

## 2023-05-23 ENCOUNTER — Other Ambulatory Visit: Payer: Self-pay

## 2023-05-23 DIAGNOSIS — E039 Hypothyroidism, unspecified: Secondary | ICD-10-CM

## 2023-05-23 MED ORDER — LEVOTHYROXINE SODIUM 175 MCG PO TABS: 175.0000 ug | ORAL_TABLET | Freq: Every day | ORAL | 0 refills | Status: DC

## 2023-06-20 DIAGNOSIS — F411 Generalized anxiety disorder: Secondary | ICD-10-CM | POA: Diagnosis not present

## 2023-06-20 DIAGNOSIS — F331 Major depressive disorder, recurrent, moderate: Secondary | ICD-10-CM | POA: Diagnosis not present

## 2023-06-23 ENCOUNTER — Encounter: Payer: Self-pay | Admitting: Nurse Practitioner

## 2023-06-25 DIAGNOSIS — F411 Generalized anxiety disorder: Secondary | ICD-10-CM | POA: Diagnosis not present

## 2023-06-25 DIAGNOSIS — E039 Hypothyroidism, unspecified: Secondary | ICD-10-CM | POA: Diagnosis not present

## 2023-06-25 DIAGNOSIS — F331 Major depressive disorder, recurrent, moderate: Secondary | ICD-10-CM | POA: Diagnosis not present

## 2023-06-26 ENCOUNTER — Encounter: Payer: Self-pay | Admitting: Nurse Practitioner

## 2023-06-26 LAB — TSH: TSH: 4.81 m[IU]/L — ABNORMAL HIGH

## 2023-07-03 ENCOUNTER — Other Ambulatory Visit: Payer: Self-pay | Admitting: Nurse Practitioner

## 2023-07-03 DIAGNOSIS — E039 Hypothyroidism, unspecified: Secondary | ICD-10-CM

## 2023-07-03 MED ORDER — LEVOTHYROXINE SODIUM 25 MCG PO TABS: ORAL_TABLET | ORAL | 0 refills | Status: DC

## 2023-07-07 ENCOUNTER — Other Ambulatory Visit: Payer: Self-pay

## 2023-07-07 ENCOUNTER — Other Ambulatory Visit: Payer: Self-pay | Admitting: Nurse Practitioner

## 2023-07-07 DIAGNOSIS — E039 Hypothyroidism, unspecified: Secondary | ICD-10-CM

## 2023-07-07 MED ORDER — THYROID 120 MG PO TABS
120.0000 mg | ORAL_TABLET | Freq: Every day | ORAL | 1 refills | Status: DC
  Filled 2023-07-07: qty 90, 90d supply, fill #0

## 2023-07-09 DIAGNOSIS — F411 Generalized anxiety disorder: Secondary | ICD-10-CM | POA: Diagnosis not present

## 2023-07-09 DIAGNOSIS — F331 Major depressive disorder, recurrent, moderate: Secondary | ICD-10-CM | POA: Diagnosis not present

## 2023-07-16 ENCOUNTER — Other Ambulatory Visit: Payer: Self-pay

## 2023-07-16 DIAGNOSIS — F321 Major depressive disorder, single episode, moderate: Secondary | ICD-10-CM | POA: Diagnosis not present

## 2023-07-16 DIAGNOSIS — F411 Generalized anxiety disorder: Secondary | ICD-10-CM | POA: Diagnosis not present

## 2023-07-16 MED ORDER — AMPHETAMINE-DEXTROAMPHET ER 30 MG PO CP24
30.0000 mg | ORAL_CAPSULE | Freq: Every day | ORAL | 0 refills | Status: DC
  Filled 2023-07-16 – 2023-09-25 (×3): qty 90, 90d supply, fill #0

## 2023-07-16 MED ORDER — FLUOXETINE HCL 60 MG PO TABS
60.0000 mg | ORAL_TABLET | Freq: Every day | ORAL | 0 refills | Status: DC
  Filled 2023-07-16: qty 90, 90d supply, fill #0

## 2023-07-16 MED ORDER — VENLAFAXINE HCL ER 75 MG PO CP24
75.0000 mg | ORAL_CAPSULE | Freq: Every day | ORAL | 0 refills | Status: DC
  Filled 2023-07-16: qty 90, 90d supply, fill #0

## 2023-07-16 MED ORDER — VRAYLAR 1.5 MG PO CAPS
1.5000 mg | ORAL_CAPSULE | Freq: Every evening | ORAL | 2 refills | Status: DC
  Filled 2023-07-16 – 2023-07-22 (×2): qty 30, 30d supply, fill #0

## 2023-07-17 ENCOUNTER — Other Ambulatory Visit: Payer: Self-pay

## 2023-07-22 ENCOUNTER — Other Ambulatory Visit: Payer: Self-pay

## 2023-07-23 DIAGNOSIS — F331 Major depressive disorder, recurrent, moderate: Secondary | ICD-10-CM | POA: Diagnosis not present

## 2023-07-23 DIAGNOSIS — F411 Generalized anxiety disorder: Secondary | ICD-10-CM | POA: Diagnosis not present

## 2023-07-30 ENCOUNTER — Other Ambulatory Visit: Payer: Self-pay

## 2023-08-11 ENCOUNTER — Other Ambulatory Visit: Payer: Self-pay

## 2023-08-13 LAB — TSH: TSH: 1.02 m[IU]/L

## 2023-08-15 ENCOUNTER — Encounter: Payer: Self-pay | Admitting: Nurse Practitioner

## 2023-08-25 ENCOUNTER — Other Ambulatory Visit: Payer: Self-pay

## 2023-08-25 MED ORDER — AMPHETAMINE-DEXTROAMPHET ER 30 MG PO CP24
30.0000 mg | ORAL_CAPSULE | Freq: Every day | ORAL | 0 refills | Status: DC
  Filled 2023-08-25: qty 30, 30d supply, fill #0

## 2023-09-10 NOTE — Progress Notes (Signed)
 "  BP 108/62 (BP Location: Right Arm, Patient Position: Sitting, Cuff Size: Normal)   Pulse 86   Temp 98.6 F (37 C) (Oral)   Ht 5' 6 (1.676 m)   Wt 185 lb 9.6 oz (84.2 kg)   SpO2 100%   BMI 29.96 kg/m    Subjective:    Patient ID: Courtney Alvarez, female    DOB: May 23, 1975, 48 y.o.   MRN: [id number]  HPI: DORRI OZTURK is a 48 y.o. female  Chief Complaint  Patient presents with   Fatigue    Patient has concerns with perimenopause hormones/ autoimmune related fatigue. Patient says she is sleeping a lot almost 12 hours and still feels tired    Discussed the use of AI scribe software for clinical note transcription with the patient, who gave verbal consent to proceed.  History of Present Illness ALIANNY TOELLE is a 48 year old female who presents with fatigue and perimenopausal symptoms.  She experiences significant fatigue, describing it as feeling 'drugged' even after a full night's sleep. This fatigue impacts her daily life, making it difficult to get out of bed. She has been taking Adderall, but it is no longer effective.  She has headaches that sometimes last for four days, prompting her to go to bed early and sleep for extended periods. These headaches sometimes start from the scalp and may be associated with her menstrual cycle.  She reports irregular menstrual periods with occasional dark brown discharge. Her periods are very rare, and she is still experiencing some discharge from a recent episode.  She experiences perimenopausal symptoms, including hot flashes and joint pain. No history of blood clots, breast cancer, or heart attacks. She had a sleep study within the last year or two, which was normal. No snoring according to her knowledge.          09/11/2023   10:37 AM 03/11/2023   10:13 AM 03/11/2023   10:10 AM  Depression screen PHQ 2/9  Decreased Interest 0 0 0  Down, Depressed, Hopeless 1 0 0  PHQ - 2 Score 1 0 0  Altered sleeping 3 0   Tired, decreased  energy 3 0   Change in appetite 3 0   Feeling bad or failure about yourself  0 0   Trouble concentrating 0 0   Moving slowly or fidgety/restless 0 0   Suicidal thoughts 0 0   PHQ-9 Score 10 0   Difficult doing work/chores  Not difficult at all     Relevant past medical, surgical, family and social history reviewed and updated as indicated. Interim medical history since our last visit reviewed. Allergies and medications reviewed and updated.  Review of Systems  Ten systems reviewed and is negative except as mentioned in HPI      Objective:      BP 108/62 (BP Location: Right Arm, Patient Position: Sitting, Cuff Size: Normal)   Pulse 86   Temp 98.6 F (37 C) (Oral)   Ht 5' 6 (1.676 m)   Wt 185 lb 9.6 oz (84.2 kg)   SpO2 100%   BMI 29.96 kg/m    Wt Readings from Last 3 Encounters:  09/11/23 185 lb 9.6 oz (84.2 kg)  04/14/23 198 lb (89.8 kg)  03/11/23 195 lb 4.8 oz (88.6 kg)    Physical Exam Vitals reviewed.  Constitutional:      Appearance: Normal appearance.  HENT:     Head: Normocephalic.  Cardiovascular:     Rate and Rhythm:  Normal rate and regular rhythm.  Pulmonary:     Effort: Pulmonary effort is normal.     Breath sounds: Normal breath sounds.  Musculoskeletal:        General: Normal range of motion.  Skin:    General: Skin is warm and dry.  Neurological:     General: No focal deficit present.     Mental Status: She is alert and oriented to person, place, and time. Mental status is at baseline.  Psychiatric:        Mood and Affect: Mood normal.        Behavior: Behavior normal.        Thought Content: Thought content normal.        Judgment: Judgment normal.        Results for orders placed or performed in visit on 07/03/23  TSH   Collection Time: 08/13/23  2:35 PM  Result Value Ref Range   TSH 1.02 mIU/L          Assessment & Plan:   Problem List Items Addressed This Visit   None Visit Diagnoses       Other fatigue    -  Primary    Relevant Orders   Analyzer ANA IFA w/RFLX Titer/Pattern,Systemic Autoimmune Panel 1   FSH/LH   Prolactin   Estradiol    Progesterone    VITAMIN D  25 Hydroxy (Vit-D Deficiency, Fractures)   Vitamin B12   Iron, TIBC and Ferritin Panel   CBC with Differential/Platelet   Comprehensive metabolic panel with GFR   TSH     Perimenopausal vasomotor symptoms       Relevant Medications   estradiol  (VIVELLE -DOT) 0.025 MG/24HR   progesterone  (PROMETRIUM ) 200 MG capsule        Assessment and Plan Assessment & Plan Perimenopausal symptoms Perimenopausal symptoms include fatigue, hot flashes, joint pain, and irregular periods, significantly impacting daily life. Hormone replacement therapy (HRT) was discussed as a treatment option. HRT may prevent osteoporosis and cardiovascular issues but carries potential risks such as increased risk of breast cancer and heart attacks, especially in those with a history of these conditions. Studies show varying results, and individual responses may differ. She is willing to try HRT to alleviate symptoms. - Initiate estrogen patch therapy, to be applied twice a week (Monday and Thursday). - Prescribe progesterone  to be taken for 12 days starting on day 14 of the menstrual cycle. - Order blood work to check hormone levels and perform an autoimmune panel. - Send prescriptions to [facility]   Fatigue Persistent fatigue despite adequate sleep and a normal previous sleep study. Fatigue affects daily activities and quality of life. Differential diagnosis includes perimenopausal symptoms and potential autoimmune conditions. - Order comprehensive blood work including hormone levels and autoimmune panel.        Follow up plan: Return in about 3 months (around 12/12/2023) for follow up.      "

## 2023-09-11 ENCOUNTER — Telehealth: Payer: Self-pay | Admitting: Pharmacy Technician

## 2023-09-11 ENCOUNTER — Encounter: Payer: Self-pay | Admitting: Nurse Practitioner

## 2023-09-11 ENCOUNTER — Ambulatory Visit: Admitting: Nurse Practitioner

## 2023-09-11 ENCOUNTER — Other Ambulatory Visit (HOSPITAL_COMMUNITY): Payer: Self-pay

## 2023-09-11 VITALS — BP 108/62 | HR 86 | Temp 98.6°F | Ht 66.0 in | Wt 185.6 lb

## 2023-09-11 DIAGNOSIS — R5383 Other fatigue: Secondary | ICD-10-CM | POA: Diagnosis not present

## 2023-09-11 DIAGNOSIS — E288 Other ovarian dysfunction: Secondary | ICD-10-CM | POA: Diagnosis not present

## 2023-09-11 DIAGNOSIS — N951 Menopausal and female climacteric states: Secondary | ICD-10-CM | POA: Diagnosis not present

## 2023-09-11 DIAGNOSIS — E559 Vitamin D deficiency, unspecified: Secondary | ICD-10-CM | POA: Diagnosis not present

## 2023-09-11 DIAGNOSIS — D510 Vitamin B12 deficiency anemia due to intrinsic factor deficiency: Secondary | ICD-10-CM | POA: Diagnosis not present

## 2023-09-11 MED ORDER — PROGESTERONE 200 MG PO CAPS: ORAL_CAPSULE | ORAL | 0 refills | Status: DC

## 2023-09-11 MED ORDER — ESTRADIOL 0.025 MG/24HR TD PTTW: 1.0000 | MEDICATED_PATCH | TRANSDERMAL | 0 refills | Status: DC

## 2023-09-11 NOTE — Telephone Encounter (Signed)
 Pharmacy Patient Advocate Encounter   Received notification from CoverMyMeds that prior authorization for Progesterone  200MG  capsules is required/requested.   Insurance verification completed.   The patient is insured through [employer] [facility] .   Per test claim: PA required and submitted KEY/EOC/Request #: [id number] from 08/12/23 to 09/10/24. Ran test claim, Copay is $8.45. This test claim was processed through [facility]- copay amounts may vary at other pharmacies due to pharmacy/plan contracts, or as the patient moves through the different stages of their insurance plan.

## 2023-09-12 ENCOUNTER — Other Ambulatory Visit (HOSPITAL_COMMUNITY): Payer: Self-pay

## 2023-09-12 ENCOUNTER — Ambulatory Visit: Payer: Self-pay | Admitting: Nurse Practitioner

## 2023-09-12 ENCOUNTER — Other Ambulatory Visit: Payer: Self-pay | Admitting: Nurse Practitioner

## 2023-09-12 DIAGNOSIS — E039 Hypothyroidism, unspecified: Secondary | ICD-10-CM

## 2023-09-12 MED ORDER — THYROID 15 MG PO TABS: ORAL_TABLET | ORAL | 0 refills | Status: DC

## 2023-09-18 LAB — CBC WITH DIFFERENTIAL/PLATELET
Absolute Lymphocytes: 1341 {cells}/uL (ref 850–3900)
Absolute Monocytes: 311 {cells}/uL (ref 200–950)
Basophils Absolute: 31 {cells}/uL (ref 0–200)
Basophils Relative: 0.6 %
Eosinophils Absolute: 71 {cells}/uL (ref 15–500)
Eosinophils Relative: 1.4 %
HCT: 42.3 % (ref 35.0–45.0)
Hemoglobin: 13.5 g/dL (ref 11.7–15.5)
MCH: 28.1 pg (ref 27.0–33.0)
MCHC: 31.9 g/dL — ABNORMAL LOW (ref 32.0–36.0)
MCV: 87.9 fL (ref 80.0–100.0)
MPV: 10.3 fL (ref 7.5–12.5)
Monocytes Relative: 6.1 %
Neutro Abs: 3346 {cells}/uL (ref 1500–7800)
Neutrophils Relative %: 65.6 %
Platelets: 308 10*3/uL (ref 140–400)
RBC: 4.81 10*6/uL (ref 3.80–5.10)
RDW: 12.9 % (ref 11.0–15.0)
Total Lymphocyte: 26.3 %
WBC: 5.1 10*3/uL (ref 3.8–10.8)

## 2023-09-18 LAB — IRON,TIBC AND FERRITIN PANEL
%SAT: 42 % (ref 16–45)
Ferritin: 81 ng/mL (ref 16–232)
Iron: 115 ug/dL (ref 40–190)
TIBC: 272 ug/dL (ref 250–450)

## 2023-09-18 LAB — ANALYZER(R)ANA IFA WITH REFLEX TITER/PATTRN,SYS AUTOIMM PNL1
Anti Nuclear Antibody (ANA): POSITIVE — AB
Anticardiolipin IgA: <2 [APL'U]/mL
Anticardiolipin IgG: <2 [GPL'U]/mL
Anticardiolipin IgM: <2 [MPL'U]/mL
Beta-2 Glyco 1 IgA: <2 U/mL
Beta-2 Glyco 1 IgM: <2 U/mL
Beta-2 Glyco I IgG: <2 U/mL
C3 Complement: 122 mg/dL (ref 83–193)
C4 Complement: 26 mg/dL (ref 15–57)
Centromere Ab Screen: <1 AI
Chromatin (Nucleosomal) Antibody: <1 AI
Cyclic Citrullin Peptide Ab: <16 U
DNA Ab (DS) Crithidia, IFA: NEGATIVE
ENA SM Ab Ser-aCnc: <1 AI
Jo-1 Autoabs: <1 AI
MUTATED CITRULLINATED VIMENTIN (MCV) AB: <20 U/mL (ref <20)
Rheumatoid Factor (IgA): <5 U
Rheumatoid Factor (IgG): <5 U
Rheumatoid Factor (IgM): <5 U
Ribonucleic Protein(ENA) Antibody, IgG: 1.1 AI — AB
SM/RNP: <1 AI
SSA (Ro) (ENA) Antibody, IgG: <1 AI
SSB (La) (ENA) Antibody, IgG: <1 AI
Scleroderma (Scl-70) (ENA) Antibody, IgG: <1 AI
Thyroperoxidase Ab SerPl-aCnc: 27 [IU]/mL — ABNORMAL HIGH (ref <9)

## 2023-09-18 LAB — COMPREHENSIVE METABOLIC PANEL WITH GFR
AG Ratio: 1.6 (calc) (ref 1.0–2.5)
ALT: 10 U/L (ref 6–29)
AST: 14 U/L (ref 10–35)
Albumin: 4.4 g/dL (ref 3.6–5.1)
Alkaline phosphatase (APISO): 56 U/L (ref 31–125)
BUN: 24 mg/dL (ref 7–25)
CO2: 25 mmol/L (ref 20–32)
Calcium: 9.2 mg/dL (ref 8.6–10.2)
Chloride: 103 mmol/L (ref 98–110)
Creat: 0.89 mg/dL (ref 0.50–0.99)
Globulin: 2.8 g/dL (ref 1.9–3.7)
Glucose, Bld: 72 mg/dL (ref 65–99)
Potassium: 4.6 mmol/L (ref 3.5–5.3)
Sodium: 137 mmol/L (ref 135–146)
Total Bilirubin: 0.4 mg/dL (ref 0.2–1.2)
Total Protein: 7.2 g/dL (ref 6.1–8.1)
eGFR: 80 mL/min/{1.73_m2} (ref >=60)

## 2023-09-18 LAB — ESTRADIOL: Estradiol: 90 pg/mL

## 2023-09-18 LAB — PROGESTERONE: Progesterone: <0.5 ng/mL

## 2023-09-18 LAB — FSH/LH
FSH: 12.6 m[IU]/mL
LH: 6.6 m[IU]/mL

## 2023-09-18 LAB — VITAMIN B12: Vitamin B-12: >2000 pg/mL — ABNORMAL HIGH (ref 200–1100)

## 2023-09-18 LAB — ANTI-NUCLEAR AB-TITER (ANA TITER): ANA Titer 1: 1:40 {titer} — ABNORMAL HIGH

## 2023-09-18 LAB — TSH: TSH: 5.97 m[IU]/L — ABNORMAL HIGH

## 2023-09-18 LAB — PROLACTIN: Prolactin: 11.6 ng/mL

## 2023-09-18 LAB — VITAMIN D 25 HYDROXY (VIT D DEFICIENCY, FRACTURES): Vit D, 25-Hydroxy: 47 ng/mL (ref 30–100)

## 2023-09-19 ENCOUNTER — Other Ambulatory Visit: Payer: Self-pay | Admitting: Nurse Practitioner

## 2023-09-19 DIAGNOSIS — R5383 Other fatigue: Secondary | ICD-10-CM

## 2023-09-19 DIAGNOSIS — R768 Other specified abnormal immunological findings in serum: Secondary | ICD-10-CM

## 2023-09-23 ENCOUNTER — Other Ambulatory Visit: Payer: Self-pay

## 2023-09-25 ENCOUNTER — Other Ambulatory Visit: Payer: Self-pay

## 2023-10-13 ENCOUNTER — Other Ambulatory Visit: Payer: Self-pay

## 2023-10-13 DIAGNOSIS — F321 Major depressive disorder, single episode, moderate: Secondary | ICD-10-CM | POA: Diagnosis not present

## 2023-10-13 DIAGNOSIS — R5382 Chronic fatigue, unspecified: Secondary | ICD-10-CM | POA: Diagnosis not present

## 2023-10-13 DIAGNOSIS — F5105 Insomnia due to other mental disorder: Secondary | ICD-10-CM | POA: Diagnosis not present

## 2023-10-13 DIAGNOSIS — F411 Generalized anxiety disorder: Secondary | ICD-10-CM | POA: Diagnosis not present

## 2023-10-13 MED ORDER — AMPHETAMINE-DEXTROAMPHET ER 30 MG PO CP24
30.0000 mg | ORAL_CAPSULE | Freq: Every day | ORAL | 0 refills | Status: DC
  Filled 2023-10-13 – 2023-12-26 (×2): qty 90, 90d supply, fill #0

## 2023-11-12 DIAGNOSIS — Z9889 Other specified postprocedural states: Secondary | ICD-10-CM | POA: Diagnosis not present

## 2023-11-12 DIAGNOSIS — M2550 Pain in unspecified joint: Secondary | ICD-10-CM | POA: Diagnosis not present

## 2023-11-12 DIAGNOSIS — R768 Other specified abnormal immunological findings in serum: Secondary | ICD-10-CM | POA: Diagnosis not present

## 2023-11-16 ENCOUNTER — Encounter: Payer: Self-pay | Admitting: Nurse Practitioner

## 2023-11-17 ENCOUNTER — Other Ambulatory Visit: Payer: Self-pay

## 2023-11-18 ENCOUNTER — Other Ambulatory Visit: Payer: Self-pay | Admitting: Nurse Practitioner

## 2023-11-18 DIAGNOSIS — N951 Menopausal and female climacteric states: Secondary | ICD-10-CM

## 2023-11-18 MED ORDER — ESTRADIOL 0.025 MG/24HR TD PTTW
1.0000 | MEDICATED_PATCH | TRANSDERMAL | 11 refills | Status: AC
  Filled 2023-12-25 – 2024-01-14 (×3): qty 8, 28d supply, fill #0
  Filled 2024-02-12: qty 8, 28d supply, fill #1
  Filled 2024-03-05: qty 8, 28d supply, fill #2
  Filled 2024-04-11: qty 8, 28d supply, fill #3
  Filled 2024-05-02: qty 8, 28d supply, fill #4

## 2023-11-18 MED ORDER — PROGESTERONE 200 MG PO CAPS
200.0000 mg | ORAL_CAPSULE | Freq: Every day | ORAL | 11 refills | Status: AC
  Filled 2023-12-25: qty 12, 28d supply, fill #0
  Filled 2024-02-12: qty 12, 28d supply, fill #1
  Filled 2024-03-05: qty 12, 28d supply, fill #2
  Filled 2024-04-11: qty 12, 28d supply, fill #3
  Filled 2024-05-02: qty 12, 28d supply, fill #4

## 2023-11-28 DIAGNOSIS — M450 Ankylosing spondylitis of multiple sites in spine: Secondary | ICD-10-CM | POA: Diagnosis not present

## 2023-11-28 DIAGNOSIS — Z9889 Other specified postprocedural states: Secondary | ICD-10-CM | POA: Diagnosis not present

## 2023-11-28 DIAGNOSIS — Z111 Encounter for screening for respiratory tuberculosis: Secondary | ICD-10-CM | POA: Diagnosis not present

## 2023-11-28 DIAGNOSIS — L4050 Arthropathic psoriasis, unspecified: Secondary | ICD-10-CM | POA: Diagnosis not present

## 2023-12-01 ENCOUNTER — Other Ambulatory Visit: Payer: Self-pay | Admitting: Nurse Practitioner

## 2023-12-01 DIAGNOSIS — E039 Hypothyroidism, unspecified: Secondary | ICD-10-CM

## 2023-12-02 DIAGNOSIS — E039 Hypothyroidism, unspecified: Secondary | ICD-10-CM | POA: Diagnosis not present

## 2023-12-02 LAB — TSH: TSH: 6.13 m[IU]/L — ABNORMAL HIGH

## 2023-12-03 ENCOUNTER — Ambulatory Visit: Payer: Self-pay | Admitting: Nurse Practitioner

## 2023-12-03 DIAGNOSIS — E039 Hypothyroidism, unspecified: Secondary | ICD-10-CM

## 2023-12-03 NOTE — Telephone Encounter (Signed)
 Requested medication (s) are due for refill today: yes  Requested medication (s) are on the active medication list: yes  Last refill:  09/12/23 #30  Future visit scheduled: no  Notes to clinic:  TSH outside normal range   Requested Prescriptions  Pending Prescriptions Disp Refills   NP THYROID  15 MG tablet [Pharmacy Med Name: THYROID  NP 0.25GR (15MG ) TABLETS] 30 tablet 0    Sig: TAKE 1 TABLET BY MOUTH MONDAY, WEDNESDAY, FRIDAY WITH 120 MG DOSE     Endocrinology:  Hypothyroid Agents Failed - 12/03/2023 11:29 AM      Failed - TSH in normal range and within 360 days    TSH  Date Value Ref Range Status  12/02/2023 6.13 (H) mIU/L Final    Comment:              Reference Range .           > or = 20 Years  0.40-4.50 .                Pregnancy Ranges           First trimester    0.26-2.66           Second trimester   0.55-2.73           Third trimester    0.43-2.91          Failed - Valid encounter within last 12 months    Recent Outpatient Visits           2 months ago Other fatigue   [facility] Gareth Mliss [place], [place]

## 2023-12-04 ENCOUNTER — Other Ambulatory Visit: Payer: Self-pay

## 2023-12-04 ENCOUNTER — Other Ambulatory Visit: Payer: Self-pay | Admitting: Nurse Practitioner

## 2023-12-04 DIAGNOSIS — E039 Hypothyroidism, unspecified: Secondary | ICD-10-CM

## 2023-12-04 MED ORDER — THYROID 120 MG PO TABS
120.0000 mg | ORAL_TABLET | Freq: Every day | ORAL | 0 refills | Status: DC
  Filled 2023-12-04: qty 90, 90d supply, fill #0

## 2023-12-04 MED ORDER — THYROID 15 MG PO TABS
15.0000 mg | ORAL_TABLET | Freq: Every day | ORAL | 0 refills | Status: DC
  Filled 2023-12-04: qty 90, 90d supply, fill #0

## 2023-12-05 ENCOUNTER — Other Ambulatory Visit: Payer: Self-pay

## 2023-12-05 DIAGNOSIS — E039 Hypothyroidism, unspecified: Secondary | ICD-10-CM

## 2023-12-05 MED ORDER — THYROID 120 MG PO TABS: 120.0000 mg | ORAL_TABLET | Freq: Every day | ORAL | 0 refills | Status: AC

## 2023-12-05 MED ORDER — THYROID 15 MG PO TABS: 15.0000 mg | ORAL_TABLET | Freq: Every day | ORAL | 0 refills | Status: DC

## 2023-12-05 NOTE — Telephone Encounter (Signed)
 Duplicate request, signed 12/04/23.  Requested Prescriptions  Pending Prescriptions Disp Refills   NP THYROID  120 MG tablet [Pharmacy Med Name: THYROID  NP 2GR (120MG ) TABLETS] 90 tablet 0    Sig: TAKE 1 TABLET BY MOUTH EVERY DAY     Endocrinology:  Hypothyroid Agents Failed - 12/05/2023 11:53 AM      Failed - TSH in normal range and within 360 days    TSH  Date Value Ref Range Status  12/02/2023 6.13 (H) mIU/L Final    Comment:              Reference Range .           > or = 20 Years  0.40-4.50 .                Pregnancy Ranges           First trimester    0.26-2.66           Second trimester   0.55-2.73           Third trimester    0.43-2.91          Failed - Valid encounter within last 12 months    Recent Outpatient Visits           2 months ago Other fatigue   [facility] Gareth Mliss [place], [place]

## 2023-12-11 ENCOUNTER — Other Ambulatory Visit: Payer: Self-pay

## 2023-12-11 DIAGNOSIS — R5382 Chronic fatigue, unspecified: Secondary | ICD-10-CM | POA: Diagnosis not present

## 2023-12-11 DIAGNOSIS — F411 Generalized anxiety disorder: Secondary | ICD-10-CM | POA: Diagnosis not present

## 2023-12-11 DIAGNOSIS — F321 Major depressive disorder, single episode, moderate: Secondary | ICD-10-CM | POA: Diagnosis not present

## 2023-12-11 DIAGNOSIS — F5105 Insomnia due to other mental disorder: Secondary | ICD-10-CM | POA: Diagnosis not present

## 2023-12-11 MED ORDER — AMPHETAMINE-DEXTROAMPHET ER 30 MG PO CP24
30.0000 mg | ORAL_CAPSULE | Freq: Every day | ORAL | 0 refills | Status: AC
  Filled 2023-12-25 – 2024-03-07 (×4): qty 90, 90d supply, fill #0

## 2023-12-25 ENCOUNTER — Other Ambulatory Visit: Payer: Self-pay

## 2023-12-25 MED ORDER — CARIPRAZINE HCL 1.5 MG PO CAPS: 1.5000 mg | ORAL_CAPSULE | Freq: Every day | ORAL | 2 refills | Status: DC

## 2023-12-25 MED ORDER — TIZANIDINE HCL 2 MG PO TABS
1.0000 mg | ORAL_TABLET | Freq: Three times a day (TID) | ORAL | 2 refills | Status: DC | PRN
  Filled 2023-12-25: qty 180, 30d supply, fill #0

## 2023-12-25 MED ORDER — FLUOXETINE HCL 60 MG PO TABS
60.0000 mg | ORAL_TABLET | Freq: Every day | ORAL | 0 refills | Status: DC
  Filled 2024-02-25: qty 90, 90d supply, fill #0

## 2023-12-26 ENCOUNTER — Other Ambulatory Visit: Payer: Self-pay

## 2023-12-27 ENCOUNTER — Other Ambulatory Visit: Payer: Self-pay

## 2024-01-14 ENCOUNTER — Other Ambulatory Visit: Payer: Self-pay

## 2024-01-27 DIAGNOSIS — F331 Major depressive disorder, recurrent, moderate: Secondary | ICD-10-CM | POA: Diagnosis not present

## 2024-01-27 DIAGNOSIS — F411 Generalized anxiety disorder: Secondary | ICD-10-CM | POA: Diagnosis not present

## 2024-02-06 DIAGNOSIS — E039 Hypothyroidism, unspecified: Secondary | ICD-10-CM | POA: Diagnosis not present

## 2024-02-06 LAB — TSH: TSH: 0.14 m[IU]/L — ABNORMAL LOW

## 2024-02-09 ENCOUNTER — Ambulatory Visit: Payer: Self-pay | Admitting: Nurse Practitioner

## 2024-02-09 ENCOUNTER — Other Ambulatory Visit: Payer: Self-pay | Admitting: Nurse Practitioner

## 2024-02-09 DIAGNOSIS — E042 Nontoxic multinodular goiter: Secondary | ICD-10-CM

## 2024-02-11 ENCOUNTER — Ambulatory Visit
Admission: RE | Admit: 2024-02-11 | Discharge: 2024-02-11 | Disposition: A | Discharge status: Home / Self Care | Source: Ambulatory Visit | Attending: Nurse Practitioner | Admitting: Nurse Practitioner

## 2024-02-11 DIAGNOSIS — E042 Nontoxic multinodular goiter: Secondary | ICD-10-CM | POA: Diagnosis not present

## 2024-02-11 DIAGNOSIS — Z0389 Encounter for observation for other suspected diseases and conditions ruled out: Secondary | ICD-10-CM | POA: Diagnosis not present

## 2024-02-12 ENCOUNTER — Other Ambulatory Visit: Payer: Self-pay

## 2024-02-12 ENCOUNTER — Ambulatory Visit: Payer: Self-pay | Admitting: Nurse Practitioner

## 2024-02-12 DIAGNOSIS — R519 Headache, unspecified: Secondary | ICD-10-CM

## 2024-02-13 ENCOUNTER — Other Ambulatory Visit: Payer: Self-pay | Admitting: Nurse Practitioner

## 2024-02-13 DIAGNOSIS — E039 Hypothyroidism, unspecified: Secondary | ICD-10-CM

## 2024-02-16 ENCOUNTER — Encounter: Payer: Self-pay | Admitting: Radiology

## 2024-02-25 ENCOUNTER — Other Ambulatory Visit: Payer: Self-pay

## 2024-02-25 ENCOUNTER — Encounter: Payer: Self-pay | Admitting: Nurse Practitioner

## 2024-02-25 ENCOUNTER — Ambulatory Visit: Admitting: Nurse Practitioner

## 2024-02-25 VITALS — BP 122/80 | HR 104 | Temp 98.3°F | Ht 66.0 in | Wt 162.0 lb

## 2024-02-25 DIAGNOSIS — R42 Dizziness and giddiness: Secondary | ICD-10-CM | POA: Diagnosis not present

## 2024-02-25 DIAGNOSIS — Z1331 Encounter for screening for depression: Secondary | ICD-10-CM | POA: Diagnosis not present

## 2024-02-25 DIAGNOSIS — R519 Headache, unspecified: Secondary | ICD-10-CM | POA: Diagnosis not present

## 2024-02-25 DIAGNOSIS — J0141 Acute recurrent pansinusitis: Secondary | ICD-10-CM | POA: Diagnosis not present

## 2024-02-25 DIAGNOSIS — Z1231 Encounter for screening mammogram for malignant neoplasm of breast: Secondary | ICD-10-CM | POA: Diagnosis not present

## 2024-02-25 DIAGNOSIS — F411 Generalized anxiety disorder: Secondary | ICD-10-CM | POA: Diagnosis not present

## 2024-02-25 MED ORDER — AZITHROMYCIN 250 MG PO TABS
ORAL_TABLET | ORAL | 0 refills | Status: AC
  Filled 2024-02-25: qty 6, 5d supply, fill #0

## 2024-02-25 MED ORDER — NORTRIPTYLINE HCL 10 MG PO CAPS
ORAL_CAPSULE | ORAL | 0 refills | Status: DC
  Filled 2024-02-25: qty 21, 14d supply, fill #0

## 2024-02-25 NOTE — Assessment & Plan Note (Signed)
 Endorses sinus pressure and congestion for 10 days. Zithromax  250 mg ordered for 5 days. Notify if symptoms worsen or do not improve.

## 2024-02-25 NOTE — Progress Notes (Signed)
 BP 122/80   Pulse (!) 104   Temp 98.3 F (36.8 C)   Ht 5' 6 (1.676 m)   Wt 162 lb (73.5 kg)   SpO2 97%   BMI 26.15 kg/m    Subjective:    Patient ID: Courtney Alvarez, female    DOB: 1975/04/18, 48 y.o.   MRN: [id number]  HPI: Courtney Alvarez is a 48 y.o. female  Chief Complaint  Patient presents with   Sore Throat    Pt c/o sore throat, sinus pressure, fatigue x1 week.     HPI: Patient presents with sinus pressure and congestion for 10 days. Reports frontal and maxillary sinus tenderness. Previously, had runny nose, but nose is currently stopped up. Verbalizes having thick yellow, green mucous. Initially, had cough, but now endorses non-productive tickling sensation in throat. Throat feels raw from clearing drainage. Head pressure radiates to bilateral ears. Denies any fever, chills, shortness of breath, or chest pain. History of sinus infections in the past. Has been using over the counter decongestants and Mucinex to alleviate symptoms.      09/11/2023   10:37 AM 03/11/2023   10:13 AM 03/11/2023   10:10 AM  Depression screen PHQ 2/9  Decreased Interest 0 0 0  Down, Depressed, Hopeless 1 0 0  PHQ - 2 Score 1 0 0  Altered sleeping 3 0   Tired, decreased energy 3 0   Change in appetite 3 0   Feeling bad or failure about yourself  0 0   Trouble concentrating 0 0   Moving slowly or fidgety/restless 0 0   Suicidal thoughts 0 0   PHQ-9 Score 10  0    Difficult doing work/chores  Not difficult at all      Data saved with a previous flowsheet row definition    Relevant past medical, surgical, family and social history reviewed and updated as indicated. Interim medical history since our last visit reviewed. Allergies and medications reviewed and updated.  Review of Systems  Constitutional: Negative for fever or weight change.  Respiratory: See HPI.  Cardiovascular: Negative for chest pain or palpitations.  Gastrointestinal: Negative for abdominal pain, no bowel changes.   Musculoskeletal: Negative for gait problem or joint swelling.  Skin: Negative for rash.  Neurological: Negative for dizziness or headache.  No other specific complaints in a complete review of systems (except as listed in HPI above).      Objective:     BP 122/80   Pulse (!) 104   Temp 98.3 F (36.8 C)   Ht 5' 6 (1.676 m)   Wt 162 lb (73.5 kg)   SpO2 97%   BMI 26.15 kg/m    Wt Readings from Last 3 Encounters:  02/25/24 162 lb (73.5 kg)  09/11/23 185 lb 9.6 oz (84.2 kg)  04/14/23 198 lb (89.8 kg)    Physical Exam Constitutional:      Appearance: She is well-developed.  HENT:     Right Ear: Tympanic membrane and ear canal normal.     Left Ear: Tympanic membrane and ear canal normal.     Nose: Congestion present.     Right Sinus: Maxillary sinus tenderness and frontal sinus tenderness present.     Left Sinus: Maxillary sinus tenderness and frontal sinus tenderness present.     Mouth/Throat:     Mouth: Mucous membranes are moist.     Pharynx: Oropharynx is clear.  Cardiovascular:     Rate and Rhythm: Normal rate and regular  rhythm.     Heart sounds: Normal heart sounds.  Pulmonary:     Effort: Pulmonary effort is normal.     Breath sounds: Normal breath sounds.  Skin:    General: Skin is warm and dry.  Neurological:     General: No focal deficit present.     Mental Status: She is alert and oriented to person, place, and time.  Psychiatric:        Mood and Affect: Mood normal.        Behavior: Behavior normal.     Results for orders placed or performed in visit on 12/04/23  TSH   Collection Time: 02/06/24  2:06 PM  Result Value Ref Range   TSH 0.14 (L) mIU/L          Assessment & Plan:   Problem List Items Addressed This Visit       Respiratory   Sinusitis   Endorses sinus pressure and congestion for 10 days. Zithromax  250 mg ordered for 5 days. Notify if symptoms worsen or do not improve.       Relevant Medications   azithromycin  (ZITHROMAX )  250 MG tablet   Other Visit Diagnoses       Screening mammogram for breast cancer    -  Primary   Relevant Orders   MM 3D SCREENING MAMMOGRAM BILATERAL BREAST       Recommend taking zyrtec, flonase , mucinex, vitamin d , vitamin c, and zinc. Push fluids and get rest.        Follow up plan: Return if symptoms worsen or fail to improve.    I have reviewed this encounter including the documentation in this note and/or discussed this patient with the provider, Alexa Everhart SNP, I am certifying that I agree with the content of this note as supervising/preceptor nurse practitioner.  Mliss Spray, FNP-C [facility] 02/25/2024, 12:09 PM

## 2024-02-26 ENCOUNTER — Other Ambulatory Visit: Payer: Self-pay

## 2024-02-27 ENCOUNTER — Encounter: Payer: Self-pay | Admitting: Nurse Practitioner

## 2024-02-28 ENCOUNTER — Other Ambulatory Visit: Payer: Self-pay

## 2024-03-05 ENCOUNTER — Other Ambulatory Visit: Payer: Self-pay

## 2024-03-07 ENCOUNTER — Other Ambulatory Visit: Payer: Self-pay

## 2024-03-08 ENCOUNTER — Other Ambulatory Visit: Payer: Self-pay

## 2024-03-08 ENCOUNTER — Other Ambulatory Visit: Payer: Self-pay | Admitting: Nurse Practitioner

## 2024-03-08 DIAGNOSIS — R5382 Chronic fatigue, unspecified: Secondary | ICD-10-CM | POA: Diagnosis not present

## 2024-03-08 DIAGNOSIS — F411 Generalized anxiety disorder: Secondary | ICD-10-CM | POA: Diagnosis not present

## 2024-03-08 DIAGNOSIS — E039 Hypothyroidism, unspecified: Secondary | ICD-10-CM

## 2024-03-08 DIAGNOSIS — F5105 Insomnia due to other mental disorder: Secondary | ICD-10-CM | POA: Diagnosis not present

## 2024-03-08 DIAGNOSIS — F321 Major depressive disorder, single episode, moderate: Secondary | ICD-10-CM | POA: Diagnosis not present

## 2024-03-08 MED ORDER — NORTRIPTYLINE HCL 10 MG PO CAPS
ORAL_CAPSULE | ORAL | 0 refills | Status: DC
  Filled 2024-03-08 – 2024-03-09 (×2): qty 60, 30d supply, fill #0

## 2024-03-09 ENCOUNTER — Other Ambulatory Visit: Payer: Self-pay

## 2024-03-09 DIAGNOSIS — E039 Hypothyroidism, unspecified: Secondary | ICD-10-CM

## 2024-03-09 NOTE — Telephone Encounter (Signed)
 Requested Prescriptions  Pending Prescriptions Disp Refills   thyroid  (ARMOUR) 120 MG tablet [Pharmacy Med Name: THYROID  2GR (120MG ) TABLETS] 90 tablet 0    Sig: TAKE 1 TABLET BY MOUTH EVERY MORNING BEFORE BREAKFAST     Endocrinology:  Hypothyroid Agents Failed - 03/09/2024  3:46 PM      Failed - TSH in normal range and within 360 days    TSH  Date Value Ref Range Status  02/06/2024 0.14 (L) mIU/L Final    Comment:              Reference Range .           > or = 20 Years  0.40-4.50 .                Pregnancy Ranges           First trimester    0.26-2.66           Second trimester   0.55-2.73           Third trimester    0.43-2.91          Failed - Valid encounter within last 12 months    Recent Outpatient Visits           1 week ago Screening mammogram for breast cancer   [facility] Gareth Mliss [place], F[place]   6 months ago Other fatigue   [facility] Gareth Mliss [place], [place]              Refused Prescriptions Disp Refills   NP THYROID  15 MG tablet [Pharmacy Med Name: THYROID  NP 0.25GR (15MG ) TABLETS] 90 tablet 0    Sig: TAKE 1 TABLET BY MOUTH ONCE DAILY WITH THE 120MG  DOSE.     Endocrinology:  Hypothyroid Agents Failed - 03/09/2024  3:46 PM      Failed - TSH in normal range and within 360 days    TSH  Date Value Ref Range Status  02/06/2024 0.14 (L) mIU/L Final    Comment:              Reference Range .           > or = 20 Years  0.40-4.50 .                Pregnancy Ranges           First trimester    0.26-2.66           Second trimester   0.55-2.73           Third trimester    0.43-2.91          Failed - Valid encounter within last 12 months    Recent Outpatient Visits           1 week ago Screening mammogram for breast cancer   [facility] Gareth Mliss [place], FNP   6 months ago Other fatigue   [facility] Gareth Mliss [place], [place]

## 2024-03-09 NOTE — Telephone Encounter (Signed)
 Requested medications are due for refill today.  yes  Requested medications are on the active medications list.  yes  Last refill. 12/05/2023 #90 0 rf  Future visit scheduled.   no  Notes to clinic.  Abnormal labs.    Requested Prescriptions  Pending Prescriptions Disp Refills   thyroid  (ARMOUR) 120 MG tablet [Pharmacy Med Name: THYROID  2GR (120MG ) TABLETS] 90 tablet 0    Sig: TAKE 1 TABLET BY MOUTH EVERY MORNING BEFORE BREAKFAST     Endocrinology:  Hypothyroid Agents Failed - 03/09/2024  3:46 PM      Failed - TSH in normal range and within 360 days    TSH  Date Value Ref Range Status  02/06/2024 0.14 (L) mIU/L Final    Comment:              Reference Range .           > or = 20 Years  0.40-4.50 .                Pregnancy Ranges           First trimester    0.26-2.66           Second trimester   0.55-2.73           Third trimester    0.43-2.91          Failed - Valid encounter within last 12 months    Recent Outpatient Visits           1 week ago Screening mammogram for breast cancer   [facility] Gareth Mliss [place], FNP   6 months ago Other fatigue   [facility] Gareth Mliss [place], [place]              Refused Prescriptions Disp Refills   NP THYROID  15 MG tablet [Pharmacy Med Name: THYROID  NP 0.25GR (15MG ) TABLETS] 90 tablet 0    Sig: TAKE 1 TABLET BY MOUTH ONCE DAILY WITH THE 120MG  DOSE.     Endocrinology:  Hypothyroid Agents Failed - 03/09/2024  3:46 PM      Failed - TSH in normal range and within 360 days    TSH  Date Value Ref Range Status  02/06/2024 0.14 (L) mIU/L Final    Comment:              Reference Range .           > or = 20 Years  0.40-4.50 .                Pregnancy Ranges           First trimester    0.26-2.66           Second trimester   0.55-2.73           Third trimester    0.43-2.91          Failed - Valid encounter within last 12 months    Recent Outpatient Visits           1  week ago Screening mammogram for breast cancer   [facility] Gareth Mliss [place], FNP   6 months ago Other fatigue   [facility] Gareth Mliss [place], [place]

## 2024-03-10 ENCOUNTER — Other Ambulatory Visit: Payer: Self-pay | Admitting: Nurse Practitioner

## 2024-03-10 ENCOUNTER — Other Ambulatory Visit: Payer: Self-pay

## 2024-03-12 ENCOUNTER — Other Ambulatory Visit: Payer: Self-pay | Admitting: Nurse Practitioner

## 2024-03-12 ENCOUNTER — Other Ambulatory Visit: Payer: Self-pay

## 2024-03-13 ENCOUNTER — Other Ambulatory Visit: Payer: Self-pay

## 2024-03-15 ENCOUNTER — Other Ambulatory Visit: Payer: Self-pay | Admitting: Nurse Practitioner

## 2024-03-15 ENCOUNTER — Other Ambulatory Visit: Payer: Self-pay

## 2024-03-16 ENCOUNTER — Other Ambulatory Visit: Payer: Self-pay

## 2024-03-16 ENCOUNTER — Other Ambulatory Visit (HOSPITAL_COMMUNITY): Payer: Self-pay

## 2024-03-16 DIAGNOSIS — E041 Nontoxic single thyroid nodule: Secondary | ICD-10-CM | POA: Diagnosis not present

## 2024-03-16 DIAGNOSIS — E039 Hypothyroidism, unspecified: Secondary | ICD-10-CM | POA: Diagnosis not present

## 2024-03-16 MED ORDER — THYROID 120 MG PO TABS
120.0000 mg | ORAL_TABLET | Freq: Every day | ORAL | 3 refills | Status: AC
  Filled 2024-03-16 – 2024-03-17 (×3): qty 90, 90d supply, fill #0

## 2024-03-17 ENCOUNTER — Other Ambulatory Visit: Payer: Self-pay

## 2024-03-17 MED ORDER — FLUOXETINE HCL 60 MG PO TABS
60.0000 mg | ORAL_TABLET | Freq: Every day | ORAL | 0 refills | Status: AC
  Filled 2024-03-17 – 2024-03-21 (×2): qty 90, 90d supply, fill #0

## 2024-03-17 MED ORDER — VENLAFAXINE HCL ER 75 MG PO CP24
75.0000 mg | ORAL_CAPSULE | Freq: Every day | ORAL | 0 refills | Status: AC
  Filled 2024-03-17 – 2024-03-21 (×2): qty 90, 90d supply, fill #0

## 2024-03-18 NOTE — Telephone Encounter (Signed)
 Requested Prescriptions  Refused Prescriptions Disp Refills   FLUoxetine  HCl 60 MG TABS [Pharmacy Med Name: FLUoxetine  HCl 60 MG Tab] 90 tablet 0    Sig: Take 1 tablet (60 mg) by mouth daily with breakfast.     Psychiatry:  Antidepressants - SSRI Failed - 03/18/2024 11:26 AM      Failed - Valid encounter within last 6 months    Recent Outpatient Visits           3 weeks ago Screening mammogram for breast cancer   [facility] Gareth Mliss [place], FNP   6 months ago Other fatigue   [facility] Gareth Mliss [place], FNP              Passed - Completed PHQ-2 or PHQ-9 in the last 360 days       venlafaxine  XR (EFFEXOR -XR) 75 MG 24 hr capsule [Pharmacy Med Name: venlafaxine  XR (EFFEXOR  XR) 75 MG 24 hr capsule] 90 capsule 0    Sig: Take 1 capsule (75 mg total) by mouth daily.     Psychiatry: Antidepressants - SNRI - desvenlafaxine & venlafaxine  Failed - 03/18/2024 11:26 AM      Failed - Valid encounter within last 6 months    Recent Outpatient Visits           3 weeks ago Screening mammogram for breast cancer   [facility] Gareth Mliss [place], FNP   6 months ago Other fatigue   [facility] Gareth Mliss F, FNP              Failed - Lipid Panel in normal range within the last 12 months    Cholesterol, Total  Date Value Ref Range Status  08/28/2016 183 100 - 199 mg/dL Final   Cholesterol  Date Value Ref Range Status  01/13/2023 214 (H) <200 mg/dL Final   LDL Cholesterol (Calc)  Date Value Ref Range Status  01/13/2023 119 (H) mg/dL (calc) Final    Comment:    Reference range: <100 . Desirable range <100 mg/dL for primary prevention;   <70 mg/dL for patients with CHD or diabetic patients  with > or = 2 CHD risk factors. SABRA LDL-C is now calculated using the Martin-Hopkins  calculation, which is a validated novel method providing  better accuracy than the Friedewald equation  in the  estimation of LDL-C.  Gladis APPLETHWAITE et al. SANDREA. 7986;689(80): 2061-2068  (http://education.[employer].com/faq/FAQ164)    HDL  Date Value Ref Range Status  01/13/2023 76 > OR = 50 mg/dL Final  94/83/7981 69 >60 mg/dL Final   Triglycerides  Date Value Ref Range Status  01/13/2023 87 <150 mg/dL Final         Passed - Cr in normal range and within 360 days    Creat  Date Value Ref Range Status  09/11/2023 0.89 0.50 - 0.99 mg/dL Final         Passed - Completed PHQ-2 or PHQ-9 in the last 360 days      Passed - Last BP in normal range    BP Readings from Last 1 Encounters:  02/25/24 122/80

## 2024-03-21 ENCOUNTER — Other Ambulatory Visit: Payer: Self-pay

## 2024-03-22 ENCOUNTER — Other Ambulatory Visit: Payer: Self-pay

## 2024-03-23 ENCOUNTER — Other Ambulatory Visit: Payer: Self-pay

## 2024-04-09 ENCOUNTER — Ambulatory Visit
Admission: RE | Admit: 2024-04-09 | Discharge: 2024-04-09 | Disposition: A | Discharge status: Home / Self Care | Source: Ambulatory Visit | Attending: Nurse Practitioner | Admitting: Nurse Practitioner

## 2024-04-09 DIAGNOSIS — Z1231 Encounter for screening mammogram for malignant neoplasm of breast: Secondary | ICD-10-CM | POA: Diagnosis not present

## 2024-04-11 ENCOUNTER — Other Ambulatory Visit: Payer: Self-pay

## 2024-04-12 ENCOUNTER — Other Ambulatory Visit: Payer: Self-pay

## 2024-04-12 MED ORDER — NORTRIPTYLINE HCL 10 MG PO CAPS
20.0000 mg | ORAL_CAPSULE | Freq: Every day | ORAL | 0 refills | Status: DC
  Filled 2024-04-12: qty 60, 30d supply, fill #0

## 2024-04-13 DIAGNOSIS — M7061 Trochanteric bursitis, right hip: Secondary | ICD-10-CM | POA: Diagnosis not present

## 2024-04-13 DIAGNOSIS — Z79899 Other long term (current) drug therapy: Secondary | ICD-10-CM | POA: Diagnosis not present

## 2024-04-13 DIAGNOSIS — L4050 Arthropathic psoriasis, unspecified: Secondary | ICD-10-CM | POA: Diagnosis not present

## 2024-04-29 ENCOUNTER — Other Ambulatory Visit: Payer: Self-pay

## 2024-04-29 MED ORDER — NORTRIPTYLINE HCL 10 MG PO CAPS
10.0000 mg | ORAL_CAPSULE | Freq: Every day | ORAL | 3 refills | Status: AC
  Filled 2024-04-29 – 2024-05-03 (×3): qty 90, 90d supply, fill #0

## 2024-05-03 ENCOUNTER — Other Ambulatory Visit: Payer: Self-pay

## 2024-05-14 ENCOUNTER — Encounter: Payer: Self-pay | Admitting: Nurse Practitioner

## 2024-05-14 ENCOUNTER — Other Ambulatory Visit: Payer: Self-pay

## 2024-05-14 ENCOUNTER — Ambulatory Visit: Admitting: Nurse Practitioner

## 2024-05-14 VITALS — BP 132/78 | HR 88 | Temp 98.0°F | Ht 66.0 in | Wt 162.0 lb

## 2024-05-14 DIAGNOSIS — J0140 Acute pansinusitis, unspecified: Secondary | ICD-10-CM | POA: Diagnosis not present

## 2024-05-14 MED ORDER — AZITHROMYCIN 250 MG PO TABS
ORAL_TABLET | ORAL | 0 refills | Status: AC
  Filled 2024-05-14: qty 6, 5d supply, fill #0

## 2024-05-14 NOTE — Assessment & Plan Note (Addendum)
 Order Azithromycin . OTC medications include Claritin for sinus, mucinex for congestion. Make sure to rest and get plenty of fluids to thin out congestion build-up. Encourage a well balanced diet and avoidance of diary products as that makes mucous more sticky.    Push fluids and get rest.

## 2024-05-14 NOTE — Progress Notes (Signed)
 "  BP 132/78   Pulse 88   Temp 98 F (36.7 C)   Ht 5' 6 (1.676 m)   Wt 162 lb (73.5 kg)   SpO2 99%   BMI 26.15 kg/m    Subjective:    Patient ID: Courtney Alvarez, female    DOB: 08-02-1975, 49 y.o.   MRN: [id number]  HPI: CHERIDAN KIBLER is a 49 y.o. female  Chief Complaint  Patient presents with   Sinus Problem    Pt c/o congestion, fatigue, sinus pressure x3 weeks.    complains of severe sinus pressure with drainage in back of throat along with ear pressure for past 10 days. Patient has used Nyquil cold/flu day/night time with no relief. Patient denies fever, chills, shortness of breath.       09/11/2023   10:37 AM 03/11/2023   10:13 AM 03/11/2023   10:10 AM  Depression screen PHQ 2/9  Decreased Interest 0 0 0  Down, Depressed, Hopeless 1 0 0  PHQ - 2 Score 1 0 0  Altered sleeping 3 0   Tired, decreased energy 3 0   Change in appetite 3 0   Feeling bad or failure about yourself  0 0   Trouble concentrating 0 0   Moving slowly or fidgety/restless 0 0   Suicidal thoughts 0 0   PHQ-9 Score 10  0    Difficult doing work/chores  Not difficult at all      Data saved with a previous flowsheet row definition    Relevant past medical, surgical, family and social history reviewed and updated as indicated. Interim medical history since our last visit reviewed. Allergies and medications reviewed and updated.  Review of Systems  Ten systems reviewed and is negative except as mentioned in HPI      Objective:     BP 132/78   Pulse 88   Temp 98 F (36.7 C)   Ht 5' 6 (1.676 m)   Wt 162 lb (73.5 kg)   SpO2 99%   BMI 26.15 kg/m    Wt Readings from Last 3 Encounters:  05/14/24 162 lb (73.5 kg)  02/25/24 162 lb (73.5 kg)  09/11/23 185 lb 9.6 oz (84.2 kg)    Physical Exam Constitutional:      General: She is awake. She is not in acute distress.    Appearance: Normal appearance. She is well-developed.  HENT:     Head: Normocephalic.     Right Ear: Tympanic membrane  normal.     Left Ear: Tympanic membrane normal.     Mouth/Throat:     Mouth: Mucous membranes are moist.     Pharynx: No oropharyngeal exudate or posterior oropharyngeal erythema.  Cardiovascular:     Rate and Rhythm: Normal rate and regular rhythm.  Pulmonary:     Breath sounds: Normal breath sounds.  Musculoskeletal:     Cervical back: Neck supple.  Skin:    General: Skin is warm and dry.  Neurological:     Mental Status: She is alert.  Psychiatric:        Behavior: Behavior is cooperative.     Results for orders placed or performed in visit on 12/04/23  TSH   Collection Time: 02/06/24  2:06 PM  Result Value Ref Range   TSH 0.14 (L) mIU/L          Assessment & Plan:   Problem List Items Addressed This Visit       Respiratory   Sinusitis -  Primary   Order Azithromycin . OTC medications include Claritin for sinus, mucinex for congestion. Make sure to rest and get plenty of fluids to thin out congestion build-up. Encourage a well balanced diet and avoidance of diary products as that makes mucous more sticky.    Push fluids and get rest.         Relevant Medications   azithromycin  (ZITHROMAX ) 250 MG tablet       Follow up plan: Return if symptoms worsen or fail to improve.  I have reviewed this encounter including the documentation in this note and/or discussed this patient with the provider, Diamone Mays SNP, I am certifying that I agree with the content of this note as supervising/preceptor nurse practitioner.  Mliss Spray, FNP-C [facility] 05/14/2024, 3:43 PM       "
